# Patient Record
Sex: Female | Born: 1945 | Race: White | Hispanic: No | Marital: Married | Smoking: Never smoker
Health system: Southern US, Community
[De-identification: ages and names within clinical notes are randomized; demographics above are authoritative.]

## PROBLEM LIST (undated history)

## (undated) DIAGNOSIS — G0490 Encephalitis and encephalomyelitis, unspecified: Secondary | ICD-10-CM

## (undated) DIAGNOSIS — N6310 Unspecified lump in the right breast, unspecified quadrant: Secondary | ICD-10-CM

## (undated) DIAGNOSIS — E785 Hyperlipidemia, unspecified: Secondary | ICD-10-CM

## (undated) DIAGNOSIS — G44059 Short lasting unilateral neuralgiform headache with conjunctival injection and tearing (SUNCT), not intractable: Secondary | ICD-10-CM

## (undated) DIAGNOSIS — I10 Essential (primary) hypertension: Secondary | ICD-10-CM

## (undated) DIAGNOSIS — G709 Myoneural disorder, unspecified: Secondary | ICD-10-CM

## (undated) DIAGNOSIS — G039 Meningitis, unspecified: Secondary | ICD-10-CM

## (undated) HISTORY — PX: BREAST EXCISIONAL BIOPSY: SUR124

## (undated) HISTORY — DX: Encephalitis and encephalomyelitis, unspecified: G04.90

## (undated) HISTORY — PX: MOHS SURGERY: SHX181

## (undated) HISTORY — PX: BREAST LUMPECTOMY: SHX2

## (undated) HISTORY — PX: OTHER SURGICAL HISTORY: SHX169

## (undated) HISTORY — DX: Meningitis, unspecified: G03.9

## (undated) HISTORY — DX: Essential (primary) hypertension: I10

## (undated) HISTORY — DX: Hyperlipidemia, unspecified: E78.5

## (undated) HISTORY — DX: Short lasting unilateral neuralgiform headache with conjunctival injection and tearing (SUNCT), not intractable: G44.059

---

## 1979-10-17 HISTORY — PX: BREAST SURGERY: SHX581

## 1988-10-16 HISTORY — PX: ABDOMINAL HYSTERECTOMY: SHX81

## 1999-08-05 ENCOUNTER — Encounter: Admission: RE | Admit: 1999-08-05 | Discharge: 1999-08-05 | Payer: Self-pay | Admitting: Surgery

## 1999-08-05 ENCOUNTER — Encounter: Payer: Self-pay | Admitting: Surgery

## 2000-03-01 ENCOUNTER — Other Ambulatory Visit: Admission: RE | Admit: 2000-03-01 | Discharge: 2000-03-01 | Payer: Self-pay | Admitting: Obstetrics and Gynecology

## 2000-03-01 ENCOUNTER — Encounter (INDEPENDENT_AMBULATORY_CARE_PROVIDER_SITE_OTHER): Payer: Self-pay

## 2000-08-06 ENCOUNTER — Encounter: Admission: RE | Admit: 2000-08-06 | Discharge: 2000-08-06 | Payer: Self-pay | Admitting: Obstetrics and Gynecology

## 2000-08-06 ENCOUNTER — Encounter: Payer: Self-pay | Admitting: Obstetrics and Gynecology

## 2001-01-24 ENCOUNTER — Other Ambulatory Visit: Admission: RE | Admit: 2001-01-24 | Discharge: 2001-01-24 | Payer: Self-pay | Admitting: Obstetrics and Gynecology

## 2001-12-18 ENCOUNTER — Encounter: Payer: Self-pay | Admitting: Gynecology

## 2001-12-18 ENCOUNTER — Encounter: Admission: RE | Admit: 2001-12-18 | Discharge: 2001-12-18 | Payer: Self-pay | Admitting: Gynecology

## 2002-02-11 ENCOUNTER — Other Ambulatory Visit: Admission: RE | Admit: 2002-02-11 | Discharge: 2002-02-11 | Payer: Self-pay | Admitting: Gynecology

## 2003-01-08 ENCOUNTER — Encounter: Admission: RE | Admit: 2003-01-08 | Discharge: 2003-01-08 | Payer: Self-pay | Admitting: Gynecology

## 2003-01-08 ENCOUNTER — Encounter: Payer: Self-pay | Admitting: Gynecology

## 2003-03-10 ENCOUNTER — Other Ambulatory Visit: Admission: RE | Admit: 2003-03-10 | Discharge: 2003-03-10 | Payer: Self-pay | Admitting: Gynecology

## 2004-01-12 ENCOUNTER — Encounter: Admission: RE | Admit: 2004-01-12 | Discharge: 2004-01-12 | Payer: Self-pay | Admitting: Obstetrics and Gynecology

## 2004-03-30 ENCOUNTER — Other Ambulatory Visit: Admission: RE | Admit: 2004-03-30 | Discharge: 2004-03-30 | Payer: Self-pay | Admitting: Gynecology

## 2004-03-30 ENCOUNTER — Encounter: Admission: RE | Admit: 2004-03-30 | Discharge: 2004-03-30 | Payer: Self-pay | Admitting: Gynecology

## 2005-01-24 ENCOUNTER — Encounter: Admission: RE | Admit: 2005-01-24 | Discharge: 2005-01-24 | Payer: Self-pay | Admitting: Gynecology

## 2005-04-03 ENCOUNTER — Other Ambulatory Visit: Admission: RE | Admit: 2005-04-03 | Discharge: 2005-04-03 | Payer: Self-pay | Admitting: Gynecology

## 2005-04-16 ENCOUNTER — Encounter: Admission: RE | Admit: 2005-04-16 | Discharge: 2005-04-16 | Payer: Self-pay | Admitting: Gynecology

## 2006-04-26 ENCOUNTER — Other Ambulatory Visit: Admission: RE | Admit: 2006-04-26 | Discharge: 2006-04-26 | Payer: Self-pay | Admitting: Gynecology

## 2006-05-07 ENCOUNTER — Encounter: Admission: RE | Admit: 2006-05-07 | Discharge: 2006-05-07 | Payer: Self-pay | Admitting: Gynecology

## 2006-06-11 ENCOUNTER — Encounter: Admission: RE | Admit: 2006-06-11 | Discharge: 2006-06-11 | Payer: Self-pay | Admitting: Gynecology

## 2007-07-02 ENCOUNTER — Other Ambulatory Visit: Admission: RE | Admit: 2007-07-02 | Discharge: 2007-07-02 | Payer: Self-pay | Admitting: Gynecology

## 2007-07-02 ENCOUNTER — Encounter: Admission: RE | Admit: 2007-07-02 | Discharge: 2007-07-02 | Payer: Self-pay | Admitting: Gynecology

## 2008-07-24 ENCOUNTER — Encounter: Admission: RE | Admit: 2008-07-24 | Discharge: 2008-07-24 | Payer: Self-pay | Admitting: Gynecology

## 2009-08-20 ENCOUNTER — Encounter: Admission: RE | Admit: 2009-08-20 | Discharge: 2009-08-20 | Payer: Self-pay | Admitting: Gynecology

## 2010-08-25 ENCOUNTER — Encounter: Admission: RE | Admit: 2010-08-25 | Discharge: 2010-08-25 | Payer: Self-pay | Admitting: Gynecology

## 2011-08-23 ENCOUNTER — Other Ambulatory Visit: Payer: Self-pay | Admitting: Gynecology

## 2011-08-23 DIAGNOSIS — Z1231 Encounter for screening mammogram for malignant neoplasm of breast: Secondary | ICD-10-CM

## 2011-08-28 ENCOUNTER — Other Ambulatory Visit: Payer: Self-pay | Admitting: Gynecology

## 2011-08-28 DIAGNOSIS — R922 Inconclusive mammogram: Secondary | ICD-10-CM

## 2011-09-11 ENCOUNTER — Ambulatory Visit
Admission: RE | Admit: 2011-09-11 | Discharge: 2011-09-11 | Disposition: A | Discharge status: Home / Self Care | Payer: Medicare Other | Source: Ambulatory Visit | Attending: Gynecology | Admitting: Gynecology

## 2011-09-11 ENCOUNTER — Other Ambulatory Visit: Payer: Self-pay | Admitting: Gynecology

## 2011-09-11 DIAGNOSIS — R922 Inconclusive mammogram: Secondary | ICD-10-CM

## 2011-09-26 ENCOUNTER — Ambulatory Visit: Payer: Self-pay

## 2012-01-03 DIAGNOSIS — G4700 Insomnia, unspecified: Secondary | ICD-10-CM | POA: Diagnosis not present

## 2012-01-03 DIAGNOSIS — E785 Hyperlipidemia, unspecified: Secondary | ICD-10-CM | POA: Diagnosis not present

## 2012-01-03 DIAGNOSIS — E559 Vitamin D deficiency, unspecified: Secondary | ICD-10-CM | POA: Diagnosis not present

## 2012-01-03 DIAGNOSIS — M2550 Pain in unspecified joint: Secondary | ICD-10-CM | POA: Diagnosis not present

## 2012-04-05 DIAGNOSIS — R7989 Other specified abnormal findings of blood chemistry: Secondary | ICD-10-CM | POA: Diagnosis not present

## 2012-04-05 DIAGNOSIS — G4700 Insomnia, unspecified: Secondary | ICD-10-CM | POA: Diagnosis not present

## 2012-04-05 DIAGNOSIS — E785 Hyperlipidemia, unspecified: Secondary | ICD-10-CM | POA: Diagnosis not present

## 2012-04-05 DIAGNOSIS — I10 Essential (primary) hypertension: Secondary | ICD-10-CM | POA: Diagnosis not present

## 2012-07-28 DIAGNOSIS — Z23 Encounter for immunization: Secondary | ICD-10-CM | POA: Diagnosis not present

## 2012-08-05 DIAGNOSIS — G4700 Insomnia, unspecified: Secondary | ICD-10-CM | POA: Diagnosis not present

## 2012-08-05 DIAGNOSIS — E785 Hyperlipidemia, unspecified: Secondary | ICD-10-CM | POA: Diagnosis not present

## 2012-08-22 ENCOUNTER — Other Ambulatory Visit: Payer: Self-pay | Admitting: Gynecology

## 2012-08-22 DIAGNOSIS — Z1231 Encounter for screening mammogram for malignant neoplasm of breast: Secondary | ICD-10-CM

## 2012-09-30 ENCOUNTER — Ambulatory Visit
Admission: RE | Admit: 2012-09-30 | Discharge: 2012-09-30 | Disposition: A | Discharge status: Home / Self Care | Payer: Medicare Other | Source: Ambulatory Visit | Attending: Gynecology | Admitting: Gynecology

## 2012-09-30 ENCOUNTER — Ambulatory Visit: Payer: Medicare Other

## 2012-09-30 DIAGNOSIS — Z1231 Encounter for screening mammogram for malignant neoplasm of breast: Secondary | ICD-10-CM

## 2012-09-30 DIAGNOSIS — N8184 Pelvic muscle wasting: Secondary | ICD-10-CM | POA: Diagnosis not present

## 2012-09-30 DIAGNOSIS — E2839 Other primary ovarian failure: Secondary | ICD-10-CM | POA: Diagnosis not present

## 2012-09-30 DIAGNOSIS — Z01419 Encounter for gynecological examination (general) (routine) without abnormal findings: Secondary | ICD-10-CM | POA: Diagnosis not present

## 2012-09-30 DIAGNOSIS — M899 Disorder of bone, unspecified: Secondary | ICD-10-CM | POA: Diagnosis not present

## 2012-10-28 ENCOUNTER — Other Ambulatory Visit: Payer: Self-pay | Admitting: Gynecology

## 2012-10-28 DIAGNOSIS — M949 Disorder of cartilage, unspecified: Secondary | ICD-10-CM

## 2012-10-28 DIAGNOSIS — M899 Disorder of bone, unspecified: Secondary | ICD-10-CM

## 2012-11-22 ENCOUNTER — Ambulatory Visit
Admission: RE | Admit: 2012-11-22 | Discharge: 2012-11-22 | Disposition: A | Discharge status: Home / Self Care | Payer: Medicare Other | Source: Ambulatory Visit | Attending: Gynecology | Admitting: Gynecology

## 2012-11-22 DIAGNOSIS — M899 Disorder of bone, unspecified: Secondary | ICD-10-CM | POA: Diagnosis not present

## 2012-11-22 DIAGNOSIS — M949 Disorder of cartilage, unspecified: Secondary | ICD-10-CM | POA: Diagnosis not present

## 2013-02-11 ENCOUNTER — Ambulatory Visit: Payer: Self-pay

## 2013-02-11 ENCOUNTER — Telehealth: Payer: Self-pay | Admitting: Family Medicine

## 2013-02-11 DIAGNOSIS — H109 Unspecified conjunctivitis: Secondary | ICD-10-CM | POA: Diagnosis not present

## 2013-02-11 NOTE — Telephone Encounter (Signed)
APPT MADE

## 2013-06-03 ENCOUNTER — Ambulatory Visit (INDEPENDENT_AMBULATORY_CARE_PROVIDER_SITE_OTHER): Payer: Medicare Other | Admitting: Nurse Practitioner

## 2013-06-03 ENCOUNTER — Encounter: Payer: Self-pay | Admitting: Nurse Practitioner

## 2013-06-03 ENCOUNTER — Ambulatory Visit (INDEPENDENT_AMBULATORY_CARE_PROVIDER_SITE_OTHER): Payer: Medicare Other

## 2013-06-03 VITALS — BP 147/69 | HR 72 | Temp 97.2°F | Ht 63.0 in | Wt 140.0 lb

## 2013-06-03 DIAGNOSIS — E785 Hyperlipidemia, unspecified: Secondary | ICD-10-CM

## 2013-06-03 DIAGNOSIS — M25551 Pain in right hip: Secondary | ICD-10-CM

## 2013-06-03 DIAGNOSIS — M25559 Pain in unspecified hip: Secondary | ICD-10-CM

## 2013-06-03 MED ORDER — SIMVASTATIN 20 MG PO TABS: 20.0000 mg | ORAL_TABLET | Freq: Every day | ORAL | Status: DC

## 2013-06-03 MED ORDER — DICLOFENAC SODIUM 50 MG PO TBEC: 50.0000 mg | DELAYED_RELEASE_TABLET | Freq: Two times a day (BID) | ORAL | Status: DC

## 2013-06-03 NOTE — Patient Instructions (Addendum)

## 2013-06-03 NOTE — Progress Notes (Signed)
  Subjective:    Patient ID: Kathryn Young, female    DOB: [birth year], 67 y.o.   MRN: [id number]  Hyperlipidemia This is a chronic problem. The current episode started more than 1 year ago. The problem is uncontrolled. Recent lipid tests were reviewed and are high. She has no history of diabetes, hypothyroidism, liver disease, obesity or nephrotic syndrome. There are no known factors aggravating her hyperlipidemia. Pertinent negatives include no chest pain, focal sensory loss, focal weakness, leg pain, myalgias or shortness of breath. Current antihyperlipidemic treatment includes statins. The current treatment provides moderate improvement of lipids. There are no compliance problems.  Risk factors for coronary artery disease include post-menopausal and family history.  Right hip pain Taking Voltaren and that helps with pain- SInce she retired her hip isn't hurtinh her as bad.  * started seeing floaters in vision everyday for the last 2 weeks- the floaters seem to stay there for several minutes and happens several times a day.  Review of Systems  Eyes: Positive for visual disturbance.  Respiratory: Negative.  Negative for shortness of breath.   Cardiovascular: Negative.  Negative for chest pain.  Genitourinary: Negative.   Musculoskeletal: Positive for arthralgias (right hip). Negative for myalgias.  Neurological: Negative.  Negative for focal weakness.       Objective:   Physical Exam  Constitutional: She is oriented to person, place, and time. She appears well-developed and well-nourished.  HENT:  Nose: Nose normal.  Mouth/Throat: Oropharynx is clear and moist.  Eyes: EOM are normal.  Neck: Trachea normal, normal range of motion and full passive range of motion without pain. Neck supple. No JVD present. Carotid bruit is not present. No thyromegaly present.  Cardiovascular: Normal rate, regular rhythm, normal heart sounds and intact distal pulses.  Exam reveals no gallop and no friction  rub.   No murmur heard. Pulmonary/Chest: Effort normal and breath sounds normal.  Abdominal: Soft. Bowel sounds are normal. She exhibits no distension and no mass. There is no tenderness.  Musculoskeletal: Normal range of motion.  Lymphadenopathy:    She has no cervical adenopathy.  Neurological: She is alert and oriented to person, place, and time. She has normal reflexes.  Skin: Skin is warm and dry.  Psychiatric: She has a normal mood and affect. Her behavior is normal. Judgment and thought content normal.   BP 147/69  Pulse 72  Temp(Src) 97.2 F (36.2 C) (Oral)  Ht 5\' 3"  (1.6 m)  Wt 140 lb (63.504 kg)  BMI 24.81 kg/m2 Right hip xray- early osteo arthritis- Preliminary reading by Paulene Floor, FNP  [facility]        Assessment & Plan:  1. Hyperlipidemia Low fat diet AND EXERCISE - CMP14+EGFR - NMR, lipoprofile - simvastatin (ZOCOR) 20 MG tablet; Take 1 tablet (20 mg total) by mouth daily.  Dispense: 30 tablet; Refill: 5  2. Right hip pain Will continue voltaren as previously rx- patien will let me know when needs ortho referral - DG Hip Complete Right; Future - diclofenac (VOLTAREN) 50 MG EC tablet; Take 1 tablet (50 mg total) by mouth 2 (two) times daily.  Dispense: 60 tablet; Refill: 2  Health maintenance reviewed  Mary-Margaret Daphine Deutscher, FNP

## 2013-06-05 LAB — NMR, LIPOPROFILE
Cholesterol: 159 mg/dL (ref <200)
HDL Cholesterol by NMR: 61 mg/dL (ref >=40)
HDL Particle Number: 36.2 umol/L (ref >=30.5)
LDL Size: 20.7 nm (ref >20.5)
LDLC SERPL CALC-MCNC: 75 mg/dL (ref <100)
LP-IR Score: 30 (ref <=45)
Small LDL Particle Number: 366 nmol/L (ref <=527)

## 2013-06-05 LAB — CMP14+EGFR
Albumin/Globulin Ratio: 2.1 (ref 1.1–2.5)
Alkaline Phosphatase: 97 IU/L (ref 39–117)
BUN/Creatinine Ratio: 13 (ref 11–26)
BUN: 11 mg/dL (ref 8–27)
Calcium: 10 mg/dL (ref 8.6–10.2)
Creatinine, Ser: 0.86 mg/dL (ref 0.57–1.00)
Globulin, Total: 2.2 g/dL (ref 1.5–4.5)
Sodium: 142 mmol/L (ref 134–144)
Total Bilirubin: 0.4 mg/dL (ref 0.0–1.2)
Total Protein: 6.8 g/dL (ref 6.0–8.5)

## 2013-06-18 ENCOUNTER — Telehealth: Payer: Self-pay | Admitting: Nurse Practitioner

## 2013-06-18 NOTE — Telephone Encounter (Signed)
Pt aware.

## 2013-07-26 DIAGNOSIS — Z23 Encounter for immunization: Secondary | ICD-10-CM | POA: Diagnosis not present

## 2013-09-02 DIAGNOSIS — L820 Inflamed seborrheic keratosis: Secondary | ICD-10-CM | POA: Diagnosis not present

## 2013-09-02 DIAGNOSIS — L819 Disorder of pigmentation, unspecified: Secondary | ICD-10-CM | POA: Diagnosis not present

## 2013-09-02 DIAGNOSIS — L570 Actinic keratosis: Secondary | ICD-10-CM | POA: Diagnosis not present

## 2013-09-02 DIAGNOSIS — D2330 Other benign neoplasm of skin of unspecified part of face: Secondary | ICD-10-CM | POA: Diagnosis not present

## 2013-09-02 DIAGNOSIS — D1801 Hemangioma of skin and subcutaneous tissue: Secondary | ICD-10-CM | POA: Diagnosis not present

## 2013-09-02 DIAGNOSIS — D485 Neoplasm of uncertain behavior of skin: Secondary | ICD-10-CM | POA: Diagnosis not present

## 2013-09-02 DIAGNOSIS — L821 Other seborrheic keratosis: Secondary | ICD-10-CM | POA: Diagnosis not present

## 2013-09-24 ENCOUNTER — Other Ambulatory Visit: Payer: Self-pay

## 2013-09-24 DIAGNOSIS — Z1231 Encounter for screening mammogram for malignant neoplasm of breast: Secondary | ICD-10-CM

## 2013-11-05 ENCOUNTER — Ambulatory Visit
Admission: RE | Admit: 2013-11-05 | Discharge: 2013-11-05 | Disposition: A | Discharge status: Home / Self Care | Payer: Medicare Other | Source: Ambulatory Visit

## 2013-11-05 DIAGNOSIS — Z1231 Encounter for screening mammogram for malignant neoplasm of breast: Secondary | ICD-10-CM

## 2014-01-23 ENCOUNTER — Telehealth: Payer: Self-pay | Admitting: Nurse Practitioner

## 2014-01-23 NOTE — Telephone Encounter (Signed)
Patient noticed a burning sensation when she applies pressure to her face, shoulder, and neck. Her clothes also irritate it. This all runs on the left side. This has been going on for 3 weeks. She hasn't noticed a rash. She has had the shingles vaccine.  She feels ok otherwise. No fever or malaise. Tylenol helps a little.   Appt scheduled for Monday.  Made aware that there is someone on call if her symptoms worsen and that there is an urgent care in town as well.

## 2014-01-26 ENCOUNTER — Encounter: Payer: Self-pay | Admitting: Family Medicine

## 2014-01-26 ENCOUNTER — Ambulatory Visit (INDEPENDENT_AMBULATORY_CARE_PROVIDER_SITE_OTHER): Payer: Medicare Other

## 2014-01-26 ENCOUNTER — Ambulatory Visit (INDEPENDENT_AMBULATORY_CARE_PROVIDER_SITE_OTHER): Payer: Medicare Other | Admitting: Family Medicine

## 2014-01-26 VITALS — BP 143/72 | HR 61 | Temp 97.5°F | Ht 63.0 in | Wt 141.2 lb

## 2014-01-26 DIAGNOSIS — R209 Unspecified disturbances of skin sensation: Secondary | ICD-10-CM

## 2014-01-26 DIAGNOSIS — R5383 Other fatigue: Secondary | ICD-10-CM | POA: Diagnosis not present

## 2014-01-26 DIAGNOSIS — M542 Cervicalgia: Secondary | ICD-10-CM | POA: Diagnosis not present

## 2014-01-26 DIAGNOSIS — E559 Vitamin D deficiency, unspecified: Secondary | ICD-10-CM | POA: Diagnosis not present

## 2014-01-26 DIAGNOSIS — R200 Anesthesia of skin: Secondary | ICD-10-CM

## 2014-01-26 DIAGNOSIS — R5381 Other malaise: Secondary | ICD-10-CM

## 2014-01-26 DIAGNOSIS — E785 Hyperlipidemia, unspecified: Secondary | ICD-10-CM | POA: Diagnosis not present

## 2014-01-26 LAB — POCT CBC
Granulocyte percent: 57.6 %G (ref 37–80)
HCT, POC: 41.3 % (ref 37.7–47.9)
Hemoglobin: 13.3 g/dL (ref 12.2–16.2)
Lymph, poc: 2.1 (ref 0.6–3.4)
MCH, POC: 30.2 pg (ref 27–31.2)
MCHC: 32.2 g/dL (ref 31.8–35.4)
MCV: 93.8 fL (ref 80–97)
MPV: 7.8 fL (ref 0–99.8)
POC Granulocyte: 3.2 (ref 2–6.9)
POC LYMPH PERCENT: 37.2 %L (ref 10–50)
Platelet Count, POC: 222 10*3/uL (ref 142–424)
RBC: 4.4 M/uL (ref 4.04–5.48)
RDW, POC: 13.1 %
WBC: 5.6 10*3/uL (ref 4.6–10.2)

## 2014-01-26 MED ORDER — METHYLPREDNISOLONE ACETATE 80 MG/ML IJ SUSP
80.0000 mg | Freq: Once | INTRAMUSCULAR | Status: AC
  Administered 2014-01-26: 80 mg via INTRAMUSCULAR

## 2014-01-26 MED ORDER — ACYCLOVIR 800 MG PO TABS: 800.0000 mg | ORAL_TABLET | Freq: Every day | ORAL | Status: DC

## 2014-01-26 NOTE — Progress Notes (Signed)
   Subjective:    Patient ID: Kathryn Young, female    DOB: 02-28-1946, 68 y.o.   MRN: [id number]  HPI  This 68 y.o. female presents for evaluation of having numbness and tingling in her face, scalp, Neck, back, left arm and hand, and left lumbar spine.  She states she has been feeling like she Has numbness and tingling in her right trapezius region where her t-shirt touches her back.  She Denies any rashes. She has been having some neck discomfort. She has hx of hyperlipidemia And she has not had labs since 8/14. Review of Systems C/o neck discomfort and numbness and tingling   No chest pain, SOB, HA, dizziness, vision change, N/V, diarrhea, constipation, dysuria, urinary urgency or frequency, or rash.  Objective:   Physical Exam  Vital signs noted  Well developed well nourished female.  HEENT - Head atraumatic Normocephalic                Eyes - PERRLA, Conjuctiva - clear Sclera- Clear EOMI                Ears - EAC's Wnl TM's Wnl Gross Hearing WNL                Nose - Nares patent                 Throat - oropharanx wnl Respiratory - Lungs CTA bilateral Cardiac - RRR S1 and S2 without murmur GI - Abdomen soft Nontender and bowel sounds active x 4 Extremities - No edema. Neuro - Grossly intact. CN 2-12 intact      Assessment & Plan:  Other and unspecified hyperlipidemia - Plan: Lipid panel  Other malaise and fatigue - Plan: POCT CBC, CMP14+EGFR, Thyroid Panel With TSH, Vitamin B12, Vit D  25 hydroxy (rtn osteoporosis monitoring)  Cervicalgia - Plan: DG Cervical Spine Complete, methylPREDNISolone acetate (DEPO-MEDROL) injection 80 mg  Numbness - Plan: Vitamin B12, acyclovir (ZOVIRAX) 800 MG tablet, methylPREDNISolone acetate (DEPO-MEDROL) injection 80 mg  Discussed with patient that I am not sure this doesn't look like shingles but will tx for this and will get  Xray of neck and labs and follow up in one week.  Lysbeth Penner FNP

## 2014-01-27 LAB — CMP14+EGFR
ALT: 12 IU/L (ref 0–32)
AST: 17 IU/L (ref 0–40)
Albumin/Globulin Ratio: 2.2 (ref 1.1–2.5)
Albumin: 4.6 g/dL (ref 3.6–4.8)
Alkaline Phosphatase: 95 IU/L (ref 39–117)
BUN/Creatinine Ratio: 15 (ref 11–26)
BUN: 12 mg/dL (ref 8–27)
CO2: 23 mmol/L (ref 18–29)
Calcium: 9.8 mg/dL (ref 8.7–10.3)
Chloride: 101 mmol/L (ref 97–108)
Creatinine, Ser: 0.82 mg/dL (ref 0.57–1.00)
GFR calc Af Amer: 86 mL/min/{1.73_m2} (ref >59)
GFR calc non Af Amer: 74 mL/min/{1.73_m2} (ref >59)
Globulin, Total: 2.1 g/dL (ref 1.5–4.5)
Glucose: 105 mg/dL — ABNORMAL HIGH (ref 65–99)
Potassium: 4.4 mmol/L (ref 3.5–5.2)
Sodium: 142 mmol/L (ref 134–144)
Total Bilirubin: 0.5 mg/dL (ref 0.0–1.2)
Total Protein: 6.7 g/dL (ref 6.0–8.5)

## 2014-01-27 LAB — LIPID PANEL
Chol/HDL Ratio: 2.7 ratio units (ref 0.0–4.4)
Cholesterol, Total: 167 mg/dL (ref 100–199)
HDL: 63 mg/dL (ref >39)
LDL Calculated: 85 mg/dL (ref 0–99)
Triglycerides: 95 mg/dL (ref 0–149)
VLDL Cholesterol Cal: 19 mg/dL (ref 5–40)

## 2014-01-27 LAB — THYROID PANEL WITH TSH
Free Thyroxine Index: 2.2 (ref 1.2–4.9)
T3 Uptake Ratio: 30 % (ref 24–39)
T4, Total: 7.2 ug/dL (ref 4.5–12.0)
TSH: 2.28 u[IU]/mL (ref 0.450–4.500)

## 2014-01-27 LAB — VITAMIN B12: Vitamin B-12: 428 pg/mL (ref 211–946)

## 2014-01-27 LAB — VITAMIN D 25 HYDROXY (VIT D DEFICIENCY, FRACTURES): Vit D, 25-Hydroxy: 38.7 ng/mL (ref 30.0–100.0)

## 2014-02-02 ENCOUNTER — Ambulatory Visit (INDEPENDENT_AMBULATORY_CARE_PROVIDER_SITE_OTHER): Payer: Medicare Other | Admitting: Family Medicine

## 2014-02-02 ENCOUNTER — Encounter: Payer: Self-pay | Admitting: Family Medicine

## 2014-02-02 VITALS — BP 156/71 | HR 65 | Temp 97.4°F | Ht 63.0 in | Wt 141.2 lb

## 2014-02-02 DIAGNOSIS — M129 Arthropathy, unspecified: Secondary | ICD-10-CM | POA: Diagnosis not present

## 2014-02-02 DIAGNOSIS — M1990 Unspecified osteoarthritis, unspecified site: Secondary | ICD-10-CM

## 2014-02-02 MED ORDER — MELOXICAM 15 MG PO TABS: 15.0000 mg | ORAL_TABLET | Freq: Every day | ORAL | Status: DC

## 2014-02-02 NOTE — Progress Notes (Signed)
   Subjective:    Patient ID: Kathryn Young, female    DOB: 10-10-1946, 68 y.o.   MRN: [id number]  HPI This 68 y.o. female presents for evaluation of neck discomfort with some numbness in her Upper neck and discomfort across her shoulders. She has had some facial numbness which Has resolved.  She is taking acyclovir for possible shingles and now is not having numbness over left eye like she was last week. She has had labs and xrays.  Her cervical spine xray shows c6 c7 DDD and her labs were normal..   Review of Systems C/o neck discomfort and arthritis No chest pain, SOB, HA, dizziness, vision change, N/V, diarrhea, constipation, dysuria, urinary urgency or frequency, myalgias, arthralgias or rash.     Objective:   Physical Exam Vital signs noted  Well developed well nourished female.  HEENT - Head atraumatic Normocephalic                Eyes - PERRLA, Conjuctiva - clear Sclera- Clear EOMI                Ears - EAC's Wnl TM's Wnl Gross Hearing WNL                Nose - Nares patent                 Throat - oropharanx wnl Respiratory - Lungs CTA bilateral Cardiac - RRR S1 and S2 without murmur GI - Abdomen soft Nontender and bowel sounds active x 4 Extremities - No edema. Neuro - Grossly intact.       Assessment & Plan:  Arthritis - Plan: meloxicam (MOBIC) 15 MG tablet One po qd #30/3 rf  Follow up prn if not better.  Lysbeth Penner FNP

## 2014-03-20 DIAGNOSIS — M949 Disorder of cartilage, unspecified: Secondary | ICD-10-CM | POA: Diagnosis not present

## 2014-03-20 DIAGNOSIS — M899 Disorder of bone, unspecified: Secondary | ICD-10-CM | POA: Diagnosis not present

## 2014-03-20 DIAGNOSIS — Z0000 Encounter for general adult medical examination without abnormal findings: Secondary | ICD-10-CM | POA: Diagnosis not present

## 2014-03-20 DIAGNOSIS — Z01419 Encounter for gynecological examination (general) (routine) without abnormal findings: Secondary | ICD-10-CM | POA: Diagnosis not present

## 2014-03-23 ENCOUNTER — Other Ambulatory Visit: Payer: Self-pay | Admitting: Gynecology

## 2014-03-23 DIAGNOSIS — M8580 Other specified disorders of bone density and structure, unspecified site: Secondary | ICD-10-CM

## 2014-04-07 ENCOUNTER — Other Ambulatory Visit: Payer: Self-pay | Admitting: Family Medicine

## 2014-04-07 ENCOUNTER — Telehealth: Payer: Self-pay | Admitting: Family Medicine

## 2014-04-07 NOTE — Telephone Encounter (Signed)
Patient pain in back and neck real sensitive and numb in face  Has already seen you for this but you treated her for shingles but its still there.

## 2014-04-07 NOTE — Telephone Encounter (Signed)
Why does she need referral to neurology?  She needs to be seen

## 2014-04-08 NOTE — Telephone Encounter (Signed)
Appointment made for 6/25

## 2014-04-09 ENCOUNTER — Ambulatory Visit (INDEPENDENT_AMBULATORY_CARE_PROVIDER_SITE_OTHER): Payer: Medicare Other | Admitting: Family Medicine

## 2014-04-09 ENCOUNTER — Encounter: Payer: Self-pay | Admitting: Family Medicine

## 2014-04-09 VITALS — BP 138/73 | HR 69 | Temp 97.8°F | Ht 63.0 in | Wt 141.2 lb

## 2014-04-09 DIAGNOSIS — M542 Cervicalgia: Secondary | ICD-10-CM

## 2014-04-09 NOTE — Progress Notes (Signed)
   Subjective:    Patient ID: Kathryn Young, female    DOB: 1946/06/02, 68 y.o.   MRN: [id number]  HPI  C/o myofascial discomfort and cephalgia.  She has some numbness in her hands in the am.  She denies any cervical radicular sx's.  She has had xray of the cervical spine showing DDD. She denies any neuromuscular abnormalities of the upper extremities.  She was tx'd with mobic And it is not helping and she wants referral  Review of Systems C/o myofascial discomfort and headaches   No chest pain, SOB,  dizziness, vision change, N/V, diarrhea, constipation, dysuria, urinary urgency or frequency, myalgias, arthralgias or rash.  Objective:   Physical Exam Vital signs noted  Well developed well nourished female.  HEENT - Head atraumatic Normocephalic Respiratory - Lungs CTA bilateral Cardiac - RRR S1 and S2 without murmur MS - TTP bilateral trapezius and myofascial region.  Full ROM cervical spine. No TTP cervical spine.      Assessment & Plan:  Cervicalgia - Plan: Ambulatory referral to Physical Therapy Continue mobic and follow up prn  Lysbeth Penner FNP

## 2014-04-16 ENCOUNTER — Ambulatory Visit: Payer: Medicare Other | Attending: Family Medicine | Admitting: Physical Therapy

## 2014-04-16 DIAGNOSIS — M542 Cervicalgia: Secondary | ICD-10-CM | POA: Insufficient documentation

## 2014-04-16 DIAGNOSIS — R293 Abnormal posture: Secondary | ICD-10-CM | POA: Insufficient documentation

## 2014-04-16 DIAGNOSIS — IMO0001 Reserved for inherently not codable concepts without codable children: Secondary | ICD-10-CM | POA: Diagnosis not present

## 2014-04-16 DIAGNOSIS — R5381 Other malaise: Secondary | ICD-10-CM | POA: Insufficient documentation

## 2014-04-20 ENCOUNTER — Ambulatory Visit: Payer: Medicare Other | Admitting: Physical Therapy

## 2014-04-20 DIAGNOSIS — R293 Abnormal posture: Secondary | ICD-10-CM | POA: Diagnosis not present

## 2014-04-20 DIAGNOSIS — R5381 Other malaise: Secondary | ICD-10-CM | POA: Diagnosis not present

## 2014-04-20 DIAGNOSIS — M542 Cervicalgia: Secondary | ICD-10-CM | POA: Diagnosis not present

## 2014-04-20 DIAGNOSIS — IMO0001 Reserved for inherently not codable concepts without codable children: Secondary | ICD-10-CM | POA: Diagnosis not present

## 2014-04-22 ENCOUNTER — Ambulatory Visit: Payer: Medicare Other | Admitting: Physical Therapy

## 2014-04-22 DIAGNOSIS — R5381 Other malaise: Secondary | ICD-10-CM | POA: Diagnosis not present

## 2014-04-22 DIAGNOSIS — M542 Cervicalgia: Secondary | ICD-10-CM | POA: Diagnosis not present

## 2014-04-22 DIAGNOSIS — R293 Abnormal posture: Secondary | ICD-10-CM | POA: Diagnosis not present

## 2014-04-22 DIAGNOSIS — IMO0001 Reserved for inherently not codable concepts without codable children: Secondary | ICD-10-CM | POA: Diagnosis not present

## 2014-04-28 ENCOUNTER — Ambulatory Visit: Payer: Medicare Other | Admitting: Physical Therapy

## 2014-04-28 DIAGNOSIS — R5381 Other malaise: Secondary | ICD-10-CM | POA: Diagnosis not present

## 2014-04-28 DIAGNOSIS — M542 Cervicalgia: Secondary | ICD-10-CM | POA: Diagnosis not present

## 2014-04-28 DIAGNOSIS — R293 Abnormal posture: Secondary | ICD-10-CM | POA: Diagnosis not present

## 2014-04-28 DIAGNOSIS — IMO0001 Reserved for inherently not codable concepts without codable children: Secondary | ICD-10-CM | POA: Diagnosis not present

## 2014-04-30 ENCOUNTER — Ambulatory Visit: Payer: Medicare Other | Admitting: Physical Therapy

## 2014-04-30 DIAGNOSIS — IMO0001 Reserved for inherently not codable concepts without codable children: Secondary | ICD-10-CM | POA: Diagnosis not present

## 2014-04-30 DIAGNOSIS — R293 Abnormal posture: Secondary | ICD-10-CM | POA: Diagnosis not present

## 2014-04-30 DIAGNOSIS — R5381 Other malaise: Secondary | ICD-10-CM | POA: Diagnosis not present

## 2014-04-30 DIAGNOSIS — M542 Cervicalgia: Secondary | ICD-10-CM | POA: Diagnosis not present

## 2014-05-05 ENCOUNTER — Ambulatory Visit: Payer: Medicare Other | Admitting: Physical Therapy

## 2014-05-05 DIAGNOSIS — M542 Cervicalgia: Secondary | ICD-10-CM | POA: Diagnosis not present

## 2014-05-05 DIAGNOSIS — R293 Abnormal posture: Secondary | ICD-10-CM | POA: Diagnosis not present

## 2014-05-05 DIAGNOSIS — R5381 Other malaise: Secondary | ICD-10-CM | POA: Diagnosis not present

## 2014-05-05 DIAGNOSIS — IMO0001 Reserved for inherently not codable concepts without codable children: Secondary | ICD-10-CM | POA: Diagnosis not present

## 2014-05-07 ENCOUNTER — Ambulatory Visit: Payer: Medicare Other | Admitting: Physical Therapy

## 2014-05-07 DIAGNOSIS — IMO0001 Reserved for inherently not codable concepts without codable children: Secondary | ICD-10-CM | POA: Diagnosis not present

## 2014-05-07 DIAGNOSIS — M542 Cervicalgia: Secondary | ICD-10-CM | POA: Diagnosis not present

## 2014-05-07 DIAGNOSIS — R293 Abnormal posture: Secondary | ICD-10-CM | POA: Diagnosis not present

## 2014-05-07 DIAGNOSIS — R5381 Other malaise: Secondary | ICD-10-CM | POA: Diagnosis not present

## 2014-05-19 ENCOUNTER — Ambulatory Visit: Payer: Medicare Other | Attending: Family Medicine | Admitting: Physical Therapy

## 2014-05-19 DIAGNOSIS — M542 Cervicalgia: Secondary | ICD-10-CM | POA: Insufficient documentation

## 2014-05-19 DIAGNOSIS — R293 Abnormal posture: Secondary | ICD-10-CM | POA: Diagnosis not present

## 2014-05-19 DIAGNOSIS — IMO0001 Reserved for inherently not codable concepts without codable children: Secondary | ICD-10-CM | POA: Diagnosis not present

## 2014-05-19 DIAGNOSIS — R5381 Other malaise: Secondary | ICD-10-CM | POA: Insufficient documentation

## 2014-05-21 ENCOUNTER — Ambulatory Visit: Payer: Medicare Other | Admitting: Physical Therapy

## 2014-05-21 DIAGNOSIS — IMO0001 Reserved for inherently not codable concepts without codable children: Secondary | ICD-10-CM | POA: Diagnosis not present

## 2014-05-26 ENCOUNTER — Encounter: Payer: Medicare Other | Admitting: Physical Therapy

## 2014-05-28 ENCOUNTER — Ambulatory Visit: Payer: Medicare Other | Admitting: Physical Therapy

## 2014-05-28 DIAGNOSIS — IMO0001 Reserved for inherently not codable concepts without codable children: Secondary | ICD-10-CM | POA: Diagnosis not present

## 2014-05-29 ENCOUNTER — Ambulatory Visit: Payer: Medicare Other | Admitting: Physical Therapy

## 2014-05-29 DIAGNOSIS — IMO0001 Reserved for inherently not codable concepts without codable children: Secondary | ICD-10-CM | POA: Diagnosis not present

## 2014-06-25 ENCOUNTER — Other Ambulatory Visit: Payer: Self-pay | Admitting: Family Medicine

## 2014-07-25 DIAGNOSIS — Z23 Encounter for immunization: Secondary | ICD-10-CM | POA: Diagnosis not present

## 2014-10-01 ENCOUNTER — Encounter: Payer: Self-pay | Admitting: Family Medicine

## 2014-10-01 ENCOUNTER — Telehealth: Payer: Self-pay | Admitting: Family Medicine

## 2014-10-01 ENCOUNTER — Ambulatory Visit (INDEPENDENT_AMBULATORY_CARE_PROVIDER_SITE_OTHER): Payer: Medicare Other | Admitting: Family Medicine

## 2014-10-01 VITALS — BP 167/84 | HR 77 | Temp 97.7°F | Ht 63.0 in | Wt 140.4 lb

## 2014-10-01 DIAGNOSIS — R300 Dysuria: Secondary | ICD-10-CM

## 2014-10-01 LAB — POCT URINALYSIS DIPSTICK
Bilirubin, UA: NEGATIVE
Glucose, UA: NEGATIVE
Ketones, UA: NEGATIVE
NITRITE UA: NEGATIVE
Protein, UA: NEGATIVE
Spec Grav, UA: 1.02
UROBILINOGEN UA: NEGATIVE
pH, UA: 6

## 2014-10-01 LAB — POCT UA - MICROSCOPIC ONLY
Bacteria, U Microscopic: NEGATIVE
CASTS, UR, LPF, POC: NEGATIVE
Crystals, Ur, HPF, POC: NEGATIVE
Mucus, UA: NEGATIVE
YEAST UA: NEGATIVE

## 2014-10-01 MED ORDER — SIMVASTATIN 20 MG PO TABS: 20.0000 mg | ORAL_TABLET | Freq: Every day | ORAL | Status: DC

## 2014-10-01 NOTE — Progress Notes (Signed)
   Subjective:    Patient ID: Kathryn Young, female    DOB: September 19, 1946, 68 y.o.   MRN: [id number]  Dysuria  This is a recurrent problem. The current episode started more than 1 month ago. The problem occurs intermittently. The problem has been unchanged. The pain is mild. There has been no fever. There is no history of pyelonephritis. Associated symptoms include hematuria. She has tried nothing for the symptoms. Her past medical history is significant for kidney stones.      Review of Systems  Constitutional: Negative.   HENT: Negative.   Eyes: Negative.   Respiratory: Negative.   Cardiovascular: Negative.   Gastrointestinal: Negative.   Endocrine: Negative.   Genitourinary: Positive for dysuria and hematuria.  Hematological: Negative.   Psychiatric/Behavioral: Negative.        Objective:   Physical Exam  Constitutional: She appears well-developed and well-nourished.  Cardiovascular: Normal rate.   Pulmonary/Chest: Effort normal.  Abdominal: Soft. There is no tenderness. There is no rebound.          1. Dysuria Since sx are not new and she is not "sick", will await result of C&S and push fluids in the meantime - POCT UA - Microscopic Only - POCT urinalysis dipstick  Wardell Honour MD - Urine culture

## 2014-10-02 LAB — URINE CULTURE: ORGANISM ID, BACTERIA: NO GROWTH

## 2014-10-05 ENCOUNTER — Telehealth: Payer: Self-pay

## 2014-10-05 NOTE — Telephone Encounter (Signed)
Left results on home am as per DPR of 06/03/2013

## 2014-10-05 NOTE — Telephone Encounter (Signed)
-----   Message from Wardell Honour, MD sent at 10/05/2014  7:50 AM EST ----- Urine culture is negative no need for antibiotics

## 2014-10-13 DIAGNOSIS — H2513 Age-related nuclear cataract, bilateral: Secondary | ICD-10-CM | POA: Diagnosis not present

## 2014-10-13 DIAGNOSIS — H43813 Vitreous degeneration, bilateral: Secondary | ICD-10-CM | POA: Diagnosis not present

## 2014-10-13 DIAGNOSIS — H43812 Vitreous degeneration, left eye: Secondary | ICD-10-CM | POA: Diagnosis not present

## 2014-10-29 ENCOUNTER — Other Ambulatory Visit: Payer: Self-pay

## 2014-10-29 DIAGNOSIS — Z1231 Encounter for screening mammogram for malignant neoplasm of breast: Secondary | ICD-10-CM

## 2014-11-11 DIAGNOSIS — L821 Other seborrheic keratosis: Secondary | ICD-10-CM | POA: Diagnosis not present

## 2014-11-11 DIAGNOSIS — D225 Melanocytic nevi of trunk: Secondary | ICD-10-CM | POA: Diagnosis not present

## 2014-11-11 DIAGNOSIS — D2261 Melanocytic nevi of right upper limb, including shoulder: Secondary | ICD-10-CM | POA: Diagnosis not present

## 2014-11-11 DIAGNOSIS — D485 Neoplasm of uncertain behavior of skin: Secondary | ICD-10-CM | POA: Diagnosis not present

## 2014-11-11 DIAGNOSIS — B079 Viral wart, unspecified: Secondary | ICD-10-CM | POA: Diagnosis not present

## 2014-11-11 DIAGNOSIS — L814 Other melanin hyperpigmentation: Secondary | ICD-10-CM | POA: Diagnosis not present

## 2014-11-11 DIAGNOSIS — D1801 Hemangioma of skin and subcutaneous tissue: Secondary | ICD-10-CM | POA: Diagnosis not present

## 2014-11-11 DIAGNOSIS — B078 Other viral warts: Secondary | ICD-10-CM | POA: Diagnosis not present

## 2014-12-02 ENCOUNTER — Ambulatory Visit
Admission: RE | Admit: 2014-12-02 | Discharge: 2014-12-02 | Disposition: A | Discharge status: Home / Self Care | Payer: Medicare Other | Source: Ambulatory Visit

## 2014-12-02 ENCOUNTER — Ambulatory Visit
Admission: RE | Admit: 2014-12-02 | Discharge: 2014-12-02 | Disposition: A | Discharge status: Home / Self Care | Payer: Medicare Other | Source: Ambulatory Visit | Attending: Gynecology | Admitting: Gynecology

## 2014-12-02 ENCOUNTER — Encounter (INDEPENDENT_AMBULATORY_CARE_PROVIDER_SITE_OTHER): Payer: Self-pay

## 2014-12-02 DIAGNOSIS — Z1231 Encounter for screening mammogram for malignant neoplasm of breast: Secondary | ICD-10-CM

## 2014-12-02 DIAGNOSIS — M899 Disorder of bone, unspecified: Secondary | ICD-10-CM | POA: Diagnosis not present

## 2014-12-02 DIAGNOSIS — M8580 Other specified disorders of bone density and structure, unspecified site: Secondary | ICD-10-CM

## 2015-02-26 ENCOUNTER — Encounter: Payer: Self-pay | Admitting: Family Medicine

## 2015-02-26 ENCOUNTER — Ambulatory Visit (INDEPENDENT_AMBULATORY_CARE_PROVIDER_SITE_OTHER): Payer: Medicare Other | Admitting: Family Medicine

## 2015-02-26 VITALS — BP 150/77 | HR 72 | Temp 97.3°F | Ht 63.0 in | Wt 141.0 lb

## 2015-02-26 DIAGNOSIS — E785 Hyperlipidemia, unspecified: Secondary | ICD-10-CM

## 2015-02-26 DIAGNOSIS — M792 Neuralgia and neuritis, unspecified: Secondary | ICD-10-CM

## 2015-02-26 LAB — POCT CBC
Granulocyte percent: 55.3 %G (ref 37–80)
HCT, POC: 41.4 % (ref 37.7–47.9)
HEMOGLOBIN: 12.9 g/dL (ref 12.2–16.2)
Lymph, poc: 2.3 (ref 0.6–3.4)
MCH: 29 pg (ref 27–31.2)
MCHC: 31.1 g/dL — AB (ref 31.8–35.4)
MCV: 93.4 fL (ref 80–97)
MPV: 7.8 fL (ref 0–99.8)
POC Granulocyte: 3.4 (ref 2–6.9)
POC LYMPH %: 37.7 % (ref 10–50)
Platelet Count, POC: 242 10*3/uL (ref 142–424)
RBC: 4.44 M/uL (ref 4.04–5.48)
RDW, POC: 12.6 %
WBC: 6.1 10*3/uL (ref 4.6–10.2)

## 2015-02-26 MED ORDER — PREDNISONE 10 MG PO TABS: ORAL_TABLET | ORAL | Status: DC

## 2015-02-26 NOTE — Progress Notes (Signed)
Subjective:  Patient ID: Kathryn Young, female    DOB: Jan 16, 1946  Age: 69 y.o. MRN: [id number]  CC: skin sensitivity   HPI Kathryn Young presents for recurrence of itching in the ears on the lobe bilaterally rather sudden onset. This will be followed by a burning sensation of the scalp neck and shoulders. She also has a burning on the left foot. There is no rash or skin lesion. The symptoms are absent from the remainder of the torso. She points to the superior margin of the trapezius on the right near the base of the neck as the point of maximal discomfort. She says she had therapy for that last year under the care of Mr. Oxford with some relief. He also gave her a medication. Mr. Gaston Islam notes from April and June of last year were reviewed verifying that symptoms are similar however at that time numbness and tingling for the primary descriptors rather than burning and itching. She was given Depo-Medrol injection and Zovirax and the patient states that that was a successful treatment along with physical therapy for the shoulder. Kathryn Young underwent x-ray of the C-spine showing multilevel degenerative disks. However no dermatomal pattern could be established.  History Kathryn Young has a past medical history of Hyperlipidemia and Hypertension.   She has past surgical history that includes Abdominal hysterectomy; Breast surgery; and Kidney Stones.   Her family history includes Cancer in her mother.She reports that she has never smoked. She does not have any smokeless tobacco history on file. She reports that she does not drink alcohol or use illicit drugs.  Current Outpatient Prescriptions on File Prior to Visit  Medication Sig Dispense Refill  . aspirin 81 MG tablet Take 81 mg by mouth daily.    . B Complex-C (SUPER B COMPLEX PO) Take 1 tablet by mouth daily.    . Cholecalciferol (VITAMIN D3) 2000 UNITS TABS Take 1 tablet by mouth daily.    . fish oil-omega-3 fatty acids 1000 MG capsule Take 2 g by  mouth daily.    . Magnesium 250 MG TABS Take 1 tablet by mouth daily.    . simvastatin (ZOCOR) 20 MG tablet Take 1 tablet (20 mg total) by mouth daily. 90 tablet 2   No current facility-administered medications on file prior to visit.    ROS Review of Systems  Constitutional: Negative for fever, chills, diaphoresis, appetite change, fatigue and unexpected weight change.  HENT: Negative for congestion, ear pain, hearing loss, postnasal drip, rhinorrhea, sneezing, sore throat and trouble swallowing.   Eyes: Negative for pain.  Respiratory: Negative for cough, chest tightness and shortness of breath.   Cardiovascular: Negative for chest pain and palpitations.  Gastrointestinal: Negative for nausea, vomiting, abdominal pain, diarrhea and constipation.  Endocrine: Positive for polyphagia.  Genitourinary: Negative for dysuria, frequency and menstrual problem.  Musculoskeletal: Negative for joint swelling and arthralgias.  Skin: Negative for rash.  Neurological: Positive for weakness and numbness.       See HPI  Psychiatric/Behavioral: Negative for dysphoric mood and agitation.    Objective:  BP 150/77 mmHg  Pulse 72  Temp(Src) 97.3 F (36.3 C) (Oral)  Ht _0  (1.6 m)  Wt 141 lb (63.957 kg)  BMI 24.98 kg/m2  BP Readings from Last 3 Encounters:  02/26/15 150/77  10/01/14 167/84  04/09/14 138/73    Wt Readings from Last 3 Encounters:  02/26/15 141 lb (63.957 kg)  10/01/14 140 lb 6.4 oz (63.685 kg)  04/09/14 141 lb 3.2 oz (  64.048 kg)     Physical Exam  Constitutional: She is oriented to person, place, and time. She appears well-developed and well-nourished. No distress.  HENT:  Head: Normocephalic and atraumatic.  Right Ear: External ear normal.  Left Ear: External ear normal.  Nose: Nose normal.  Mouth/Throat: Oropharynx is clear and moist.  Eyes: Conjunctivae and EOM are normal. Pupils are equal, round, and reactive to light.  Neck: Normal range of motion. Neck supple.  No thyromegaly present.  Cardiovascular: Normal rate, regular rhythm and normal heart sounds.   No murmur heard. Pulmonary/Chest: Effort normal and breath sounds normal. No respiratory distress. She has no wheezes. She has no rales.  Abdominal: Soft. Bowel sounds are normal. She exhibits no distension. There is no tenderness.  Lymphadenopathy:    She has no cervical adenopathy.  Neurological: She is alert and oriented to person, place, and time. She has normal reflexes.  Skin: Skin is warm and dry.  Psychiatric: She has a normal mood and affect. Her behavior is normal. Judgment and thought content normal.    No results found for: HGBA1C  Lab Results  Component Value Date   WBC 6.1 02/26/2015   HGB 12.9 02/26/2015   HCT 41.4 02/26/2015   GLUCOSE 93 02/26/2015   CHOL 157 02/26/2015   TRIG 105 02/26/2015   HDL 70 02/26/2015   LDLCALC 66 02/26/2015   ALT 16 02/26/2015   AST 18 02/26/2015   NA 143 02/26/2015   K 4.0 02/26/2015   CL 102 02/26/2015   CREATININE 0.85 02/26/2015   BUN 12 02/26/2015   CO2 26 02/26/2015   TSH 2.280 01/26/2014    Dg Bone Density  12/04/2014   CLINICAL DATA:  Postmenopausal.  EXAM: DUAL X-RAY ABSORPTIOMETRY (DXA) FOR BONE MINERAL DENSITY  FINDINGS: AP LUMBAR SPINE  Bone Mineral Density (BMD):  0.881 g/cm2  Young Adult T-Score:  -1.5  Z-Score:  0.5  LEFT FEMUR NECK  Bone Mineral Density (BMD):  0.651 g/cm2  Young Adult T-Score: -1.8  Z-Score:  -0.1  ASSESSMENT: Patient's diagnostic category is LOW BONE MASS by WHO Criteria.  FRACTURE RISK: Moderate  FRAX: Based on the [place], the 10 year probability of a major osteoporotic fracture is 10%. The 10 year probability of a hip fracture is 1.6%.  COMPARISON: Previous examinations, the most recent dated 09/21/2013. Since that time, there has been a statistically significant 4.0% decrease in bone mineral density of the lumbar spine and a statistically insignificant 2.8% decrease in bone  mineral density of the left hip.  Effective therapies are available in the form of bisphosphonates, selective estrogen receptor modulators, biologic agents, and hormone replacement therapy (for women). All patients should ensure an adequate intake of dietary calcium (1200 mg daily) and vitamin D (800 IU daily) unless contraindicated.  All treatment decisions require clinical judgment and consideration of individual patient factors, including patient preferences, co-morbidities, previous drug use, risk factors not captured in the FRAX model (e.g., frailty, falls, vitamin D deficiency, increased bone turnover, interval significant decline in bone density) and possible under- or over-estimation of fracture risk by FRAX.  The National Osteoporosis Foundation recommends that FDA-approved medical therapies be considered in postmenopausal women and men age 49 or older with a:  1. Hip or vertebral (clinical or morphometric) fracture.  2. T-score of -2.5 or lower at the spine or hip.  3. Ten-year fracture probability by FRAX of 3% or greater for hip fracture or 20% or greater for major osteoporotic  fracture.  People with diagnosed cases of osteoporosis or at high risk for fracture should have regular bone mineral density tests. For patients eligible for Medicare, routine testing is allowed once every 2 years. The testing frequency can be increased to one year for patients who have rapidly progressing disease, those who are receiving or discontinuing medical therapy to restore bone mass, or have additional risk factors.  [employer] [facility]) Criteria:  Normal: T-scores from +1.0 to -1.0  Low Bone Mass (Osteopenia): T-scores between -1.0 and -2.5  Osteoporosis: T-scores -2.5 and below  Comparison to Reference Population:  T-score is the key measure used in the diagnosis of osteoporosis and relative risk determination for fracture. It provides a value for bone mass relative to the mean bone mass of a young adult  reference population expressed in terms of standard deviation (SD).  Z-score is the age-matched score showing the patient's values compared to a population matched for age, sex, and race. This is also expressed in terms of standard deviation. The patient may have values that compare favorably to the age-matched values and still be at increased risk for fracture.   Electronically Signed   By: Claudie Revering M.D.   On: 12/04/2014 18:01   Mm Screening Breast Tomo Bilateral  12/02/2014   CLINICAL DATA:  Screening.  EXAM: DIGITAL SCREENING BILATERAL MAMMOGRAM WITH 3D TOMO WITH CAD  COMPARISON:  Previous exam(s).  ACR Breast Density Category c: The breast tissue is heterogeneously dense, which may obscure small masses.  FINDINGS: There are no findings suspicious for malignancy. Images were processed with CAD.  IMPRESSION: No mammographic evidence of malignancy. A result letter of this screening mammogram will be mailed directly to the patient.  RECOMMENDATION: Screening mammogram in one year. (Code:SM-B-01Y)  BI-RADS CATEGORY  1: Negative.   Electronically Signed   By: Margarette Canada M.D.   On: 12/02/2014 13:48    Assessment & Plan:   Miamarie was seen today for skin sensitivity.  Diagnoses and all orders for this visit:  Neuralgia and neuritis Orders: -     POCT CBC -     CMP14+EGFR -     Cancel: POCT SEDIMENTATION RATE -     Sedimentation rate  Hyperlipidemia Orders: -     Lipid panel  Other orders -     predniSONE (DELTASONE) 10 MG tablet; Take 5 daily for 3 days followed by 4,3,2 and 1 for 3 days each.   I have discontinued Ms. Cawley's Melatonin, diclofenac, acyclovir, and meloxicam. I am also having her start on predniSONE. Additionally, I am having her maintain her fish oil-omega-3 fatty acids, aspirin, Magnesium, B Complex-C (SUPER B COMPLEX PO), Vitamin D3, and simvastatin.  Meds ordered this encounter  Medications  . predniSONE (DELTASONE) 10 MG tablet    Sig: Take 5 daily for 3 days  followed by 4,3,2 and 1 for 3 days each.    Dispense:  45 tablet    Refill:  0     Follow-up: Return if symptoms worsen or fail to improve.  Claretta Fraise, M.D.

## 2015-02-27 LAB — CMP14+EGFR
ALT: 16 IU/L (ref 0–32)
AST: 18 IU/L (ref 0–40)
Albumin/Globulin Ratio: 1.7 (ref 1.1–2.5)
Albumin: 4.3 g/dL (ref 3.6–4.8)
Alkaline Phosphatase: 95 IU/L (ref 39–117)
BILIRUBIN TOTAL: 0.5 mg/dL (ref 0.0–1.2)
BUN/Creatinine Ratio: 14 (ref 11–26)
BUN: 12 mg/dL (ref 8–27)
CHLORIDE: 102 mmol/L (ref 97–108)
CO2: 26 mmol/L (ref 18–29)
Calcium: 9.4 mg/dL (ref 8.7–10.3)
Creatinine, Ser: 0.85 mg/dL (ref 0.57–1.00)
GFR calc Af Amer: 81 mL/min/{1.73_m2} (ref >59)
GFR calc non Af Amer: 71 mL/min/{1.73_m2} (ref >59)
GLUCOSE: 93 mg/dL (ref 65–99)
Globulin, Total: 2.6 g/dL (ref 1.5–4.5)
Potassium: 4 mmol/L (ref 3.5–5.2)
Sodium: 143 mmol/L (ref 134–144)
Total Protein: 6.9 g/dL (ref 6.0–8.5)

## 2015-02-27 LAB — SEDIMENTATION RATE: Sed Rate: 8 mm/hr (ref 0–40)

## 2015-02-27 LAB — LIPID PANEL
Chol/HDL Ratio: 2.2 ratio units (ref 0.0–4.4)
Cholesterol, Total: 157 mg/dL (ref 100–199)
HDL: 70 mg/dL (ref >39)
LDL CALC: 66 mg/dL (ref 0–99)
TRIGLYCERIDES: 105 mg/dL (ref 0–149)
VLDL Cholesterol Cal: 21 mg/dL (ref 5–40)

## 2015-03-02 ENCOUNTER — Telehealth: Payer: Self-pay | Admitting: *Deleted

## 2015-03-02 NOTE — Telephone Encounter (Signed)
-----   Message from Claretta Fraise, MD sent at 03/01/2015  7:11 PM EDT ----- Theophilus Bones,    Your lab result is normal.Some minor variations that are not significant are commonly marked abnormal, but do not represent any medical problem for you.  Best regards, Claretta Fraise, M.D.

## 2015-03-02 NOTE — Telephone Encounter (Signed)
Pt notified of results Verbalizes understanding 

## 2015-03-22 ENCOUNTER — Telehealth: Payer: Self-pay | Admitting: Family Medicine

## 2015-03-22 DIAGNOSIS — M542 Cervicalgia: Secondary | ICD-10-CM

## 2015-03-22 NOTE — Telephone Encounter (Signed)
Patient states that she is still having same symptoms of when she was here on 5-13. Her head is a little better but everything else is still the same. Also states that she had this one year ago and was referred to therapy and all pain went away. She is not sure if she needs therapy again or if she should do something else. Please advise and route to Pool B

## 2015-03-22 NOTE — Telephone Encounter (Signed)
Refer to physical therapy for neck pain.

## 2015-03-22 NOTE — Telephone Encounter (Signed)
Patient notified. Referral made.

## 2015-04-05 ENCOUNTER — Ambulatory Visit: Payer: Medicare Other | Attending: Family Medicine | Admitting: Physical Therapy

## 2015-04-05 DIAGNOSIS — M542 Cervicalgia: Secondary | ICD-10-CM

## 2015-04-05 NOTE — Therapy (Signed)
[facility] [place], [place], [address] Phone: [phone]   Fax:  [phone]  Physical Therapy Evaluation  Patient Details  Name: Kathryn Young MRN: [id number] Date of Birth: 12-04-1945 Referring Provider:  Claretta Fraise, MD  Encounter Date: 04/05/2015      PT End of Session - 04/05/15 1417    Visit Number 1   Number of Visits 12   Date for PT Re-Evaluation 05/17/15   PT Start Time 0148   PT Stop Time 0242   PT Time Calculation (min) 54 min   Activity Tolerance Patient tolerated treatment well   Behavior During Therapy [facility] for tasks assessed/performed      Past Medical History  Diagnosis Date  . Hyperlipidemia   . Hypertension     Past Surgical History  Procedure Laterality Date  . Abdominal hysterectomy    . Breast surgery    . Kidney stones      There were no vitals filed for this visit.  Visit Diagnosis:  Neck pain - Plan: PT plan of care cert/re-cert      Subjective Assessment - 04/05/15 1414    Subjective My neck did great after physical therapy but then the pain came back.   Patient Stated Goals Get out of pain like before.   Currently in Pain? Yes   Pain Score 9    Pain Location Neck   Pain Orientation Right   Pain Descriptors / Indicators Burning   Pain Type Chronic pain   Pain Onset More than a month ago   Pain Frequency Constant   Aggravating Factors  ADL's.   Pain Relieving Factors Medication.   Effect of Pain on Daily Activities Not able to accomplish ADL's like before.   Multiple Pain Sites No            [facility] PT Assessment - 04/05/15 0001    Assessment   Medical Diagnosis Neck pain.   Onset Date/Surgical Date --  Ongoing.   Precautions   Precautions None   Restrictions   Weight Bearing Restrictions No   Balance Screen   Has the patient fallen in the past 6 months No   Has the patient had a decrease in activity level because of a fear of falling?  No   Is the  patient reluctant to leave their home because of a fear of falling?  No   Home [employer] residence   Prior Function   Level of Independence Independent   Cognition   Overall Cognitive Status Within Functional Limits for tasks assessed   Posture/Postural Control   Posture/Postural Control Postural limitations   Postural Limitations Rounded Shoulders;Forward head   ROM / Strength   AROM / PROM / Strength AROM;Strength   AROM   Overall AROM Comments Bilateral active cervical rotation= 75 degrees.   Strength   Overall Strength Comments Normal bilateral UE strength.   Palpation   Palpation comment --  Tender right levator scapulae area.   Special Tests    Special Tests --  Normal bilateral UE DTR's.                   [facility] Adult PT Treatment/Exercise - 04/05/15 0001    Modalities   Modalities Electrical Stimulation;Moist Heat   Moist Heat Therapy   Number Minutes Moist Heat 20 Minutes   Moist Heat Location --  Neck.   Acupuncturist Location --  Right levator scapulae  region.   Electrical Stimulation Action --  80-150 HZ (5 seconds on and 5 seconds off).   Electrical Stimulation Goals Pain   Manual Therapy   Manual therapy comments Right levator scapulae release x 8 minutes.                  PT Short Term Goals - 2015-04-18 1449    PT SHORT TERM GOAL #1   Title Ind with HEP.   Time 2   Period Weeks   Status New           PT Long Term Goals - 04/18/2015 1449    PT LONG TERM GOAL #1   Title Evening pain-level not > 3/10.   Time 6   Period Weeks   Status New   PT LONG TERM GOAL #2   Title Eliminate burning sensation.   PT LONG TERM GOAL #3   Title Average daily pain-level not > 3/10.               Plan - Apr 18, 2015 1421    Clinical Impression Statement The patient has had ongoing neck pain but it resolved nicely with physical therapy only to return.  Her pain-level is a  high 9/10 today with pain at highs at night.  Additionally, she reports a burning sensation on her scalp and ears and an itching sensation.   Pt will benefit from skilled therapeutic intervention in order to improve on the following deficits Pain;Decreased activity tolerance   Rehab Potential Excellent   PT Frequency 3x / week   PT Duration 6 weeks   PT Treatment/Interventions Moist Heat;Ultrasound;Therapeutic exercise;Patient/family education;Manual techniques   PT Next Visit Plan Modalities and STW/M to affected cervical region.  Patient may benefit for costo-vertebral mobs as well.  May try intermittent traction at 18#.          G-Codes - 04-18-15 1450    Functional Assessment Tool Used FOTO.   Functional Limitation Mobility: Walking and moving around   Mobility: Walking and Moving Around Current Status [phone]) At least 20 percent but less than 40 percent impaired, limited or restricted   Mobility: Walking and Moving Around Goal Status [phone]) At least 1 percent but less than 20 percent impaired, limited or restricted       Problem List Patient Active Problem List   Diagnosis Date Noted  . Hyperlipidemia 06/03/2013    Flynn Gwyn, [place] MPT April 18, 2015, 2:53 PM  [facility] [address] [place], [place], [address] Phone: [phone]   Fax:  [phone]

## 2015-04-05 NOTE — Patient Instructions (Signed)
Reviewed cervical range of motion exercises with patient.

## 2015-04-07 ENCOUNTER — Ambulatory Visit: Payer: Medicare Other | Admitting: Physical Therapy

## 2015-04-07 DIAGNOSIS — M542 Cervicalgia: Secondary | ICD-10-CM

## 2015-04-07 NOTE — Therapy (Signed)
[facility] [place], [place], [address] Phone: [phone]   Fax:  [phone]  Physical Therapy Treatment  Patient Details  Name: Kathryn Young MRN: [id number] Date of Birth: 1946-03-21 Referring Provider:  Wardell Honour, MD  Encounter Date: 04/07/2015      PT End of Session - 04/07/15 1124    Visit Number 2   Number of Visits 12   Date for PT Re-Evaluation 05/17/15   PT Start Time 1120   PT Stop Time 1212   PT Time Calculation (min) 52 min   Activity Tolerance Patient tolerated treatment well   Behavior During Therapy [facility] for tasks assessed/performed      Past Medical History  Diagnosis Date  . Hyperlipidemia   . Hypertension     Past Surgical History  Procedure Laterality Date  . Abdominal hysterectomy    . Breast surgery    . Kidney stones      There were no vitals filed for this visit.  Visit Diagnosis:  Neck pain      Subjective Assessment - 04/07/15 1123    Subjective Reports only burning sensation in L scapular region now and goes into her scalp. Also has burning in her feet that wakes her at night. Burning sensations are always worse at night. States that burning sensation feels like you have a sunburn but go back in the sun the next day. Also have itching sensation in the palmar lateral side of both hands.   Patient Stated Goals Get out of pain like before.   Currently in Pain? Yes   Pain Score 8    Pain Location Neck   Pain Orientation Right   Pain Descriptors / Indicators Burning   Pain Type Chronic pain   Pain Onset More than a month ago            [facility] PT Assessment - 04/07/15 0001    Assessment   Medical Diagnosis Neck pain.                     [facility] Adult PT Treatment/Exercise - 04/07/15 0001    Modalities   Modalities Electrical Stimulation;Traction   Acupuncturist Location R superior scapulae   Electrical Stimulation  Action Pre-Mod   Electrical Stimulation Parameters 80-150 Hz x15 min   Electrical Stimulation Goals Pain   Traction   Type of Traction Cervical   Min (lbs) 5   Max (lbs) 18   Hold Time 99   Rest Time 5   Time 15   Manual Therapy   Manual Therapy Myofascial release   Myofascial Release STW to R superior border of scapula to decrease tightness and pain                  PT Short Term Goals - 04/07/15 1236    PT SHORT TERM GOAL #1   Title Ind with HEP.   Time 2   Period Weeks   Status On-going           PT Long Term Goals - 04/07/15 1236    PT LONG TERM GOAL #1   Title Evening pain-level not > 3/10.   Time 6   Period Weeks   Status On-going   PT LONG TERM GOAL #2   Title Eliminate burning sensation.   Status On-going   PT LONG TERM GOAL #3   Title Average daily pain-level not > 3/10.   Status On-going  Plan - 04/07/15 1203    Clinical Impression Statement Patient tolerated treatment well today without complaint of increased burning sensation. Normal modalties response noted following removal of the modalities. Moderate tightness noted in R Upper Trap and Levator Scapulae today and patient was able to tolerate moderate pressure during manaul therapy. Denied pain or burning sensation following end of treatment only the rims of her ears were itching which she reports that she applies cortizone itch creme to them daily in the mornings.   Pt will benefit from skilled therapeutic intervention in order to improve on the following deficits Pain;Decreased activity tolerance   Rehab Potential Excellent   PT Frequency 3x / week   PT Duration 6 weeks   PT Treatment/Interventions Moist Heat;Ultrasound;Therapeutic exercise;Patient/family education;Manual techniques   PT Next Visit Plan Continue per MPT POC. Assess response to MPT discretion cervical traction next treatment.   Consulted and Agree with Plan of Care Patient        Problem  List Patient Active Problem List   Diagnosis Date Noted  . Hyperlipidemia 06/03/2013    Wynelle Fanny, PTA 04/07/2015, 12:37 PM  [facility] [address] [place], [place], [address] Phone: [phone]   Fax:  [phone]

## 2015-04-09 ENCOUNTER — Ambulatory Visit: Payer: Medicare Other | Admitting: Physical Therapy

## 2015-04-09 ENCOUNTER — Encounter: Payer: Self-pay | Admitting: Physical Therapy

## 2015-04-09 DIAGNOSIS — M542 Cervicalgia: Secondary | ICD-10-CM

## 2015-04-09 NOTE — Therapy (Signed)
[facility] [place], [place], [address] Phone: [phone]   Fax:  [phone]  Physical Therapy Treatment  Patient Details  Name: Kathryn Young MRN: [id number] Date of Birth: 1946/02/28 Referring Provider:  Wardell Honour, MD  Encounter Date: 04/09/2015      PT End of Session - 04/09/15 0959    Visit Number 3   Number of Visits 12   Date for PT Re-Evaluation 05/17/15   PT Start Time 0948   PT Stop Time 1039   PT Time Calculation (min) 51 min   Activity Tolerance Patient tolerated treatment well   Behavior During Therapy [facility] for tasks assessed/performed      Past Medical History  Diagnosis Date  . Hyperlipidemia   . Hypertension     Past Surgical History  Procedure Laterality Date  . Abdominal hysterectomy    . Breast surgery    . Kidney stones      There were no vitals filed for this visit.  Visit Diagnosis:  Neck pain      Subjective Assessment - 04/09/15 0958    Subjective States that yesterday the pulling sensation in the R shoulder only occured 3 times which is an improvement but continues to have burning and itching in ears, scalp and shoulder.   Patient Stated Goals Get out of pain like before.   Currently in Pain? Yes   Pain Score 8    Pain Location Neck   Pain Orientation Right   Pain Descriptors / Indicators Burning   Pain Type Chronic pain   Pain Onset More than a month ago            [facility] PT Assessment - 04/09/15 0001    Assessment   Medical Diagnosis Neck pain.                     [facility] Adult PT Treatment/Exercise - 04/09/15 0001    Modalities   Modalities Electrical Stimulation;Traction   Acupuncturist Location R superior scapulae   Electrical Stimulation Action Pre-Mod   Electrical Stimulation Parameters 80-150 Hz x15 min   Electrical Stimulation Goals Pain   Traction   Type of Traction Cervical   Min (lbs) 5   Max  (lbs) 18   Hold Time 99   Rest Time 5   Time 15   Manual Therapy   Manual Therapy Myofascial release   Myofascial Release STW to R superior border of scapula/ UT/ Levator Scapulae to improve blood flow and oxygen to the nerves in the region                  PT Short Term Goals - 04/07/15 1236    PT SHORT TERM GOAL #1   Title Ind with HEP.   Time 2   Period Weeks   Status On-going           PT Long Term Goals - 04/07/15 1236    PT LONG TERM GOAL #1   Title Evening pain-level not > 3/10.   Time 6   Period Weeks   Status On-going   PT LONG TERM GOAL #2   Title Eliminate burning sensation.   Status On-going   PT LONG TERM GOAL #3   Title Average daily pain-level not > 3/10.   Status On-going               Plan - 04/09/15 1042    Clinical Impression  Statement Patient tolerated treatment well without complaint of increased pain or burning sensation. Normal modalities response noted following removal of the modalities. Decreased tightness observed in the R UT region and patient was able to tolerate moderate pressure and had no burning sensations during STW. Experienced burning sensation iin the lower thoracic musculature on both sides following traction but no pain.   Pt will benefit from skilled therapeutic intervention in order to improve on the following deficits Pain;Decreased activity tolerance   Rehab Potential Excellent   PT Frequency 3x / week   PT Duration 6 weeks   PT Treatment/Interventions Moist Heat;Ultrasound;Therapeutic exercise;Patient/family education;Manual techniques   PT Next Visit Plan Continue per MPT POC. Assess response to MPT discretion cervical traction next treatment.   Consulted and Agree with Plan of Care Patient        Problem List Patient Active Problem List   Diagnosis Date Noted  . Hyperlipidemia 06/03/2013    Wynelle Fanny, PTA 04/09/2015, 10:45 AM  [facility] [address] [place], [place], [address] Phone: [phone]   Fax:  [phone]

## 2015-04-12 ENCOUNTER — Encounter: Payer: Self-pay | Admitting: Physical Therapy

## 2015-04-12 ENCOUNTER — Ambulatory Visit: Payer: Medicare Other | Admitting: Physical Therapy

## 2015-04-12 DIAGNOSIS — M542 Cervicalgia: Secondary | ICD-10-CM

## 2015-04-12 NOTE — Therapy (Signed)
[facility] [place], [place], [address] Phone: [phone]   Fax:  [phone]  Physical Therapy Treatment  Patient Details  Name: Kathryn Young MRN: [id number] Date of Birth: 1946-08-07 Referring Provider:  Wardell Honour, MD  Encounter Date: 04/12/2015      PT End of Session - 04/12/15 1349    Visit Number 4   Number of Visits 12   Date for PT Re-Evaluation 05/17/15   PT Start Time 1315   PT Stop Time 1404   PT Time Calculation (min) 49 min   Activity Tolerance Patient tolerated treatment well   Behavior During Therapy [facility] for tasks assessed/performed      Past Medical History  Diagnosis Date  . Hyperlipidemia   . Hypertension     Past Surgical History  Procedure Laterality Date  . Abdominal hysterectomy    . Breast surgery    . Kidney stones      There were no vitals filed for this visit.  Visit Diagnosis:  Neck pain      Subjective Assessment - 04/12/15 1335    Subjective Patient reported 40% improvement thus far and has less of a pulling in right shoulder, yet no improvement with burning sensation   Patient Stated Goals Get out of pain like before.   Currently in Pain? Yes   Pain Score 7    Pain Location Neck   Pain Orientation Right   Pain Descriptors / Indicators Burning   Pain Onset More than a month ago   Pain Frequency Constant   Aggravating Factors  increased activity   Pain Relieving Factors medication                         [facility] Adult PT Treatment/Exercise - 04/12/15 0001    Moist Heat Therapy   Number Minutes Moist Heat 15 Minutes   Moist Heat Location Cervical;Shoulder  RIGHT   Electrical Stimulation   Electrical Stimulation Location Right superior scapulae   Electrical Stimulation Action premod   Electrical Stimulation Parameters 80-150hz    Electrical Stimulation Goals Pain   Traction   Type of Traction Cervical   Min (lbs) 5   Max (lbs) 18   Hold  Time 99   Rest Time 5   Time 15   Manual Therapy   Manual Therapy Myofascial release   Myofascial Release STW to Right superior border of scapula/ UT/ Levator Scapulae to improve blood flow and oxygen to the nerves in the region                  PT Short Term Goals - 04/07/15 1236    PT SHORT TERM GOAL #1   Title Ind with HEP.   Time 2   Period Weeks   Status On-going           PT Long Term Goals - 04/07/15 1236    PT LONG TERM GOAL #1   Title Evening pain-level not > 3/10.   Time 6   Period Weeks   Status On-going   PT LONG TERM GOAL #2   Title Eliminate burning sensation.   Status On-going   PT LONG TERM GOAL #3   Title Average daily pain-level not > 3/10.   Status On-going               Plan - 04/12/15 1351    Clinical Impression Statement Patient tolerated treatment very well today and reported feeling  40% better overall. Unable to meet any further goals due to pain limitations. Patient is going to call MD to request to see a nurologist for ongoing nerve symptoms due to no iprovement with the burning sensation. Disscussed with MPT for patient to see MD and to continue or DC pending MD referral.   Pt will benefit from skilled therapeutic intervention in order to improve on the following deficits Pain;Decreased activity tolerance   Rehab Potential Excellent   PT Frequency 3x / week   PT Duration 6 weeks   PT Treatment/Interventions Moist Heat;Ultrasound;Therapeutic exercise;Patient/family education;Manual techniques   PT Next Visit Plan Continue per MD refrerral for neurologist or cont therapy   Consulted and Agree with Plan of Care Patient        Problem List Patient Active Problem List   Diagnosis Date Noted  . Hyperlipidemia 06/03/2013    Rip Hawes P, PTA 04/12/2015, 2:11 PM  [facility] [address] [place], [place], [address] Phone: [phone]   Fax:  [phone]

## 2015-04-13 ENCOUNTER — Encounter: Payer: Self-pay | Admitting: Family Medicine

## 2015-04-13 ENCOUNTER — Ambulatory Visit (INDEPENDENT_AMBULATORY_CARE_PROVIDER_SITE_OTHER): Payer: Medicare Other | Admitting: Family Medicine

## 2015-04-13 ENCOUNTER — Telehealth: Payer: Self-pay | Admitting: Family Medicine

## 2015-04-13 VITALS — BP 152/71 | HR 74 | Temp 97.6°F | Ht 63.0 in | Wt 145.2 lb

## 2015-04-13 DIAGNOSIS — R203 Hyperesthesia: Secondary | ICD-10-CM | POA: Diagnosis not present

## 2015-04-13 NOTE — Patient Instructions (Signed)
Discontinue ALL meds and supplements until further notice

## 2015-04-13 NOTE — Progress Notes (Signed)
Subjective:  Patient ID: Kathryn Young, female    DOB: 03/14/1946  Age: 69 y.o. MRN: [id number]  CC: skin sensitivity   HPI Kathryn Young presents for continued tingling and itching. She notices it primarily behind the ears but she also says that her entire back feels like it's been skinned up. However there is no lesion. Additionally there is pain and discomfort at her waistband for her panties and underneath her bra. This was diminished somewhat temporarily by the previous treatment,prednisone, see previous office visit. However symptoms have returned and have become more widespread now than they were at that time. Also having odd odors noted and the roof of her mouth feels "rough."  History Tinamarie has a past medical history of Hyperlipidemia and Hypertension.   She has past surgical history that includes Abdominal hysterectomy; Breast surgery; and Kidney Stones.   Her family history includes Cancer in her mother.She reports that she has never smoked. She does not have any smokeless tobacco history on file. She reports that she does not drink alcohol or use illicit drugs.  Outpatient Prescriptions Prior to Visit  Medication Sig Dispense Refill  . aspirin 81 MG tablet Take 81 mg by mouth daily.    . B Complex-C (SUPER B COMPLEX PO) Take 1 tablet by mouth daily.    . Cholecalciferol (VITAMIN D3) 2000 UNITS TABS Take 1 tablet by mouth daily.    . fish oil-omega-3 fatty acids 1000 MG capsule Take 2 g by mouth daily.    . simvastatin (ZOCOR) 20 MG tablet Take 1 tablet (20 mg total) by mouth daily. 90 tablet 2  . Magnesium 250 MG TABS Take 1 tablet by mouth daily.    . predniSONE (DELTASONE) 10 MG tablet Take 5 daily for 3 days followed by 4,3,2 and 1 for 3 days each. (Patient not taking: Reported on 04/13/2015) 45 tablet 0   No facility-administered medications prior to visit.    ROS Review of Systems  Constitutional: Negative for fever, chills, diaphoresis, appetite change, fatigue and  unexpected weight change.  HENT: Positive for mouth sores (roof of mouth feels rough/.). Negative for congestion, ear pain, hearing loss, postnasal drip, rhinorrhea, sneezing, sore throat and trouble swallowing.   Eyes: Negative for pain.  Respiratory: Negative for cough, chest tightness and shortness of breath.   Cardiovascular: Negative for chest pain and palpitations.  Gastrointestinal: Negative for nausea, vomiting, abdominal pain, diarrhea and constipation.  Genitourinary: Negative for dysuria, frequency and menstrual problem.  Musculoskeletal: Negative for joint swelling and arthralgias.  Skin: Negative for color change, pallor, rash and wound.  Neurological: Negative for weakness and numbness.       See HPI  Psychiatric/Behavioral: Negative for dysphoric mood and agitation.    Objective:  BP 152/71 mmHg  Pulse 74  Temp(Src) 97.6 F (36.4 C) (Oral)  Ht 5\' 3"  (1.6 m)  Wt 145 lb 3.2 oz (65.862 kg)  BMI 25.73 kg/m2  BP Readings from Last 3 Encounters:  04/13/15 152/71  02/26/15 150/77  10/01/14 167/84    Wt Readings from Last 3 Encounters:  04/13/15 145 lb 3.2 oz (65.862 kg)  02/26/15 141 lb (63.957 kg)  10/01/14 140 lb 6.4 oz (63.685 kg)     Physical Exam  Constitutional: She is oriented to person, place, and time. She appears well-developed and well-nourished. No distress.  HENT:  Head: Normocephalic and atraumatic.  Right Ear: External ear normal.  Left Ear: External ear normal.  Nose: Nose normal.  Mouth/Throat: Oropharynx  is clear and moist.  Eyes: Conjunctivae and EOM are normal. Pupils are equal, round, and reactive to light.  Neck: Normal range of motion. Neck supple. No thyromegaly present.  Cardiovascular: Normal rate, regular rhythm and normal heart sounds.   No murmur heard. Pulmonary/Chest: Effort normal and breath sounds normal. No respiratory distress. She has no wheezes. She has no rales.  Abdominal: Soft. Bowel sounds are normal. She exhibits no  distension. There is no tenderness.  Lymphadenopathy:    She has no cervical adenopathy.  Neurological: She is alert and oriented to person, place, and time. She has normal reflexes.  Skin: Skin is warm and dry. No rash noted. No erythema. No pallor.  Psychiatric: She has a normal mood and affect. Her behavior is normal. Judgment and thought content normal.    No results found for: HGBA1C  Lab Results  Component Value Date   WBC 6.1 02/26/2015   HGB 12.9 02/26/2015   HCT 41.4 02/26/2015   GLUCOSE 93 02/26/2015   CHOL 157 02/26/2015   TRIG 105 02/26/2015   HDL 70 02/26/2015   LDLCALC 66 02/26/2015   ALT 16 02/26/2015   AST 18 02/26/2015   NA 143 02/26/2015   K 4.0 02/26/2015   CL 102 02/26/2015   CREATININE 0.85 02/26/2015   BUN 12 02/26/2015   CO2 26 02/26/2015   TSH 2.280 01/26/2014    Dg Bone Density  12/04/2014   CLINICAL DATA:  Postmenopausal.  EXAM: DUAL X-RAY ABSORPTIOMETRY (DXA) FOR BONE MINERAL DENSITY  FINDINGS: AP LUMBAR SPINE  Bone Mineral Density (BMD):  0.881 g/cm2  Young Adult T-Score:  -1.5  Z-Score:  0.5  LEFT FEMUR NECK  Bone Mineral Density (BMD):  0.651 g/cm2  Young Adult T-Score: -1.8  Z-Score:  -0.1  ASSESSMENT: Patient's diagnostic category is LOW BONE MASS by WHO Criteria.  FRACTURE RISK: Moderate  FRAX: Based on the [place], the 10 year probability of a major osteoporotic fracture is 10%. The 10 year probability of a hip fracture is 1.6%.  COMPARISON: Previous examinations, the most recent dated 09/21/2013. Since that time, there has been a statistically significant 4.0% decrease in bone mineral density of the lumbar spine and a statistically insignificant 2.8% decrease in bone mineral density of the left hip.  Effective therapies are available in the form of bisphosphonates, selective estrogen receptor modulators, biologic agents, and hormone replacement therapy (for women). All patients should ensure an adequate intake of dietary  calcium (1200 mg daily) and vitamin D (800 IU daily) unless contraindicated.  All treatment decisions require clinical judgment and consideration of individual patient factors, including patient preferences, co-morbidities, previous drug use, risk factors not captured in the FRAX model (e.g., frailty, falls, vitamin D deficiency, increased bone turnover, interval significant decline in bone density) and possible under- or over-estimation of fracture risk by FRAX.  The National Osteoporosis Foundation recommends that FDA-approved medical therapies be considered in postmenopausal women and men age 18 or older with a:  1. Hip or vertebral (clinical or morphometric) fracture.  2. T-score of -2.5 or lower at the spine or hip.  3. Ten-year fracture probability by FRAX of 3% or greater for hip fracture or 20% or greater for major osteoporotic fracture.  People with diagnosed cases of osteoporosis or at high risk for fracture should have regular bone mineral density tests. For patients eligible for Medicare, routine testing is allowed once every 2 years. The testing frequency can be increased to one year for  patients who have rapidly progressing disease, those who are receiving or discontinuing medical therapy to restore bone mass, or have additional risk factors.  [employer] [facility]) Criteria:  Normal: T-scores from +1.0 to -1.0  Low Bone Mass (Osteopenia): T-scores between -1.0 and -2.5  Osteoporosis: T-scores -2.5 and below  Comparison to Reference Population:  T-score is the key measure used in the diagnosis of osteoporosis and relative risk determination for fracture. It provides a value for bone mass relative to the mean bone mass of a young adult reference population expressed in terms of standard deviation (SD).  Z-score is the age-matched score showing the patient's values compared to a population matched for age, sex, and race. This is also expressed in terms of standard deviation. The patient may have  values that compare favorably to the age-matched values and still be at increased risk for fracture.   Electronically Signed   By: Claudie Revering M.D.   On: 12/04/2014 18:01   Mm Screening Breast Tomo Bilateral  12/02/2014   CLINICAL DATA:  Screening.  EXAM: DIGITAL SCREENING BILATERAL MAMMOGRAM WITH 3D TOMO WITH CAD  COMPARISON:  Previous exam(s).  ACR Breast Density Category c: The breast tissue is heterogeneously dense, which may obscure small masses.  FINDINGS: There are no findings suspicious for malignancy. Images were processed with CAD.  IMPRESSION: No mammographic evidence of malignancy. A result letter of this screening mammogram will be mailed directly to the patient.  RECOMMENDATION: Screening mammogram in one year. (Code:SM-B-01Y)  BI-RADS CATEGORY  1: Negative.   Electronically Signed   By: Margarette Canada M.D.   On: 12/02/2014 13:48    Assessment & Plan:   Alta was seen today for skin sensitivity.  Diagnoses and all orders for this visit:  Hyperesthesia Orders: -     CBC with Differential/Platelet -     Sedimentation rate -     Protein electrophoresis, serum  I have discontinued Ms. Cimino's predniSONE. I am also having her maintain her fish oil-omega-3 fatty acids, aspirin, Magnesium, B Complex-C (SUPER B COMPLEX PO), Vitamin D3, and simvastatin.  No orders of the defined types were placed in this encounter.    Discontinue ALL meds  and supplements until further notice   Follow-up: Return in about 2 weeks (around 04/27/2015).  Claretta Fraise, M.D.

## 2015-04-13 NOTE — Telephone Encounter (Signed)
appt scheduled for pt to come in to discuss

## 2015-04-14 ENCOUNTER — Encounter: Payer: Self-pay | Admitting: Physical Therapy

## 2015-04-14 ENCOUNTER — Ambulatory Visit: Payer: Medicare Other | Admitting: Physical Therapy

## 2015-04-14 DIAGNOSIS — M542 Cervicalgia: Secondary | ICD-10-CM | POA: Diagnosis not present

## 2015-04-14 LAB — CBC WITH DIFFERENTIAL/PLATELET
BASOS ABS: 0 10*3/uL (ref 0.0–0.2)
Basos: 0 %
EOS (ABSOLUTE): 0.1 10*3/uL (ref 0.0–0.4)
Eos: 1 %
Hematocrit: 38.8 % (ref 34.0–46.6)
Hemoglobin: 12.7 g/dL (ref 11.1–15.9)
Immature Grans (Abs): 0 10*3/uL (ref 0.0–0.1)
Immature Granulocytes: 0 %
LYMPHS ABS: 3 10*3/uL (ref 0.7–3.1)
Lymphs: 42 %
MCH: 30.9 pg (ref 26.6–33.0)
MCHC: 32.7 g/dL (ref 31.5–35.7)
MCV: 94 fL (ref 79–97)
Monocytes Absolute: 0.5 10*3/uL (ref 0.1–0.9)
Monocytes: 7 %
NEUTROS PCT: 50 %
Neutrophils Absolute: 3.5 10*3/uL (ref 1.4–7.0)
Platelets: 242 10*3/uL (ref 150–379)
RBC: 4.11 x10E6/uL (ref 3.77–5.28)
RDW: 13.4 % (ref 12.3–15.4)
WBC: 7.1 10*3/uL (ref 3.4–10.8)

## 2015-04-14 LAB — PROTEIN ELECTROPHORESIS, SERUM
A/G Ratio: 1.3 (ref 0.7–1.7)
ALPHA 2: 0.8 g/dL (ref 0.4–1.0)
Albumin ELP: 3.7 g/dL (ref 2.9–4.4)
Alpha 1: 0.1 g/dL (ref 0.0–0.4)
Beta: 1 g/dL (ref 0.7–1.3)
GAMMA GLOBULIN: 0.9 g/dL (ref 0.4–1.8)
Globulin, Total: 2.8 g/dL (ref 2.2–3.9)
TOTAL PROTEIN: 6.5 g/dL (ref 6.0–8.5)

## 2015-04-14 LAB — SEDIMENTATION RATE: Sed Rate: 6 mm/hr (ref 0–40)

## 2015-04-14 NOTE — Therapy (Signed)
[facility] [place], [place], [address] Phone: [phone]   Fax:  [phone]  Physical Therapy Treatment  Patient Details  Name: Kathryn Young MRN: [id number] Date of Birth: 1946/04/09 Referring Provider:  Wardell Honour, MD  Encounter Date: 04/14/2015      PT End of Session - 04/14/15 1134    Visit Number 5   Number of Visits 12   PT Start Time 2542   PT Stop Time 1209   PT Time Calculation (min) 48 min   Activity Tolerance Patient tolerated treatment well   Behavior During Therapy Kathryn Young for tasks assessed/performed      Past Medical History  Diagnosis Date  . Hyperlipidemia   . Hypertension     Past Surgical History  Procedure Laterality Date  . Abdominal hysterectomy    . Breast surgery    . Kidney stones      There were no vitals filed for this visit.  Visit Diagnosis:  Neck pain      Subjective Assessment - 04/14/15 1123    Subjective Patient went to Dr. Livia Snellen and he wants patient to continue therapy and he will do blood work and had her stop all medications and pills to rule out possible medications that could be causing burning symptoms and to see him back in three weeks.   Patient Stated Goals Get out of pain like before.   Currently in Pain? Yes   Pain Score 6    Pain Location Neck   Pain Orientation Right   Pain Descriptors / Indicators Burning  pulling   Pain Type Chronic pain   Pain Onset More than a month ago   Pain Frequency Constant   Aggravating Factors  being out in sun or hot water from shower   Pain Relieving Factors cream                         [facility] Adult PT Treatment/Exercise - 04/14/15 0001    Electrical Stimulation   Electrical Stimulation Location Right superior scapulae   Electrical Stimulation Action premod   Electrical Stimulation Parameters 80-'150hz'    Electrical Stimulation Goals Pain   Traction   Type of Traction Cervical   Min (lbs) 5   Max (lbs) 19   Hold Time 99   Rest Time 5   Time 15   Manual Therapy   Manual Therapy Myofascial release   Myofascial Release STW to Right superior border of scapula/ UT/ Levator Scapulae to improve blood flow and oxygen to the nerves in the region                PT Education - 04/14/15 1143    Education provided Yes   Education Details HEP-ROM/stretch   Person(s) Educated Patient   Methods Explanation;Demonstration;Handout   Comprehension Verbalized understanding;Returned demonstration          PT Short Term Goals - 04/14/15 1143    PT SHORT TERM GOAL #1   Title Ind with HEP.   Time 2   Period Weeks   Status Achieved           PT Long Term Goals - 04/07/15 1236    PT LONG TERM GOAL #1   Title Evening pain-level not > 3/10.   Time 6   Period Weeks   Status On-going   PT LONG TERM GOAL #2   Title Eliminate burning sensation.   Status On-going   PT LONG TERM  GOAL #3   Title Average daily pain-level not > 3/10.   Status On-going               Plan - 04/14/15 1136    Clinical Impression Statement Patient slowly progressing today, has reported one degree pain level improvement. Patient has less pulling in right shoulder today. No improvement with burning sensation at this time, yet will be going back to MD in three weeks. STG #1 met LTGoals ongoing due to limitations with symptoms. Increased cervical traction to 19# per MPT approval. Felt good after treatment today.   Pt will benefit from skilled therapeutic intervention in order to improve on the following deficits Pain;Decreased activity tolerance   Rehab Potential Excellent   PT Frequency 3x / week   PT Duration 6 weeks   PT Treatment/Interventions Moist Heat;Ultrasound;Therapeutic exercise;Patient/family education;Manual techniques   PT Next Visit Plan Continue with POC per MD Stacks   Consulted and Agree with Plan of Care Patient        Problem List Patient Active Problem List   Diagnosis  Date Noted  . Hyperlipidemia 06/03/2013    Phillips Climes, PTA 04/14/2015, 12:17 PM  [facility] [address] [place], [place], [address] Phone: [phone]   Fax:  [phone]

## 2015-04-14 NOTE — Patient Instructions (Signed)
Levator Scapula Stretch   Place left hand on same side shoulder blade. With other hand, gently stretch head down and away. Hold 5____ seconds. Repeat __5__ times per set. Do __2__ sets per session. Do _2___ sessions per day.  AROM: Neck Rotation   Turn head slowly to look over one shoulder, then the other. Hold each position _10___ seconds. Repeat _5___ times per set. Do __2__ sets per session. Do _2-3___ sessions per day.    AROM: Lateral Neck Flexion   Slowly tilt head toward one shoulder, then the other. Hold each position _10___ seconds. Repeat __5__ times per set. Do __2__ sets per session. Do __2-3__ sessions per day.  http://orth.exer.us/296   Copyright  VHI. All rights reserved.  Stretch Break - Chin Tuck   Looking straight forward, tuck chin and hold __10__ seconds. Relax and return to starting position. Repeat __5-10__ times every _3-4___ hours.  Copyright  VHI. All rights reserved.  Stretch Break - Chest and Shoulder Stretch   Maintaining erect posture, draw shoulders back while bringing elbows back and inward. Return to starting position. Repeat __10-20__ times every _3-4___ hours.

## 2015-04-20 ENCOUNTER — Ambulatory Visit: Payer: Medicare Other | Admitting: Family Medicine

## 2015-04-20 ENCOUNTER — Encounter: Payer: Self-pay | Admitting: *Deleted

## 2015-04-20 ENCOUNTER — Ambulatory Visit: Payer: Medicare Other | Attending: Family Medicine | Admitting: *Deleted

## 2015-04-20 DIAGNOSIS — M542 Cervicalgia: Secondary | ICD-10-CM

## 2015-04-20 NOTE — Therapy (Signed)
[facility] [place], [place], [address] Phone: [phone]   Fax:  [phone]  Physical Therapy Treatment  Patient Details  Name: Kathryn Young MRN: [id number] Date of Birth: 17-Jun-1946 Referring Provider:  Wardell Honour, MD  Encounter Date: 04/20/2015      PT End of Session - 04/20/15 1440    Visit Number 6   Number of Visits 12   Date for PT Re-Evaluation 05/17/15   PT Start Time 2683   PT Stop Time 1436   PT Time Calculation (min) 47 min      Past Medical History  Diagnosis Date  . Hyperlipidemia   . Hypertension     Past Surgical History  Procedure Laterality Date  . Abdominal hysterectomy    . Breast surgery    . Kidney stones      There were no vitals filed for this visit.  Visit Diagnosis:  Neck pain      Subjective Assessment - 04/20/15 1417    Subjective Patient went to Dr. Livia Snellen and he wants patient to continue therapy and he will do blood work and had her stop all medications and pills to rule out possible medications that could be causing burning symptoms and to see him back in three weeks.   Patient Stated Goals Get out of pain like before.   Currently in Pain? Yes   Pain Score 5    Pain Location Neck   Pain Orientation Right   Pain Descriptors / Indicators Burning   Pain Type Chronic pain   Pain Onset More than a month ago   Pain Frequency Constant   Pain Relieving Factors cream                         [facility] Adult PT Treatment/Exercise - 04/20/15 0001    Modalities   Modalities Ultrasound;Electrical Stimulation   Ultrasound   Ultrasound Location RT side neck   Ultrasound Parameters 1.5 w/cm2 x 10 mins to RT Utrap and cervical paras in sitting   Ultrasound Goals Pain   Traction   Type of Traction Cervical   Min (lbs) 5   Max (lbs) 19   Hold Time 99   Rest Time 5   Time 15   Manual Therapy   Manual Therapy Myofascial release   Myofascial Release STW/  IASTM to Right superior border of scapula/ UT/ Levator Scapulae to improve blood flow and oxygen to the nerves in the region                  PT Short Term Goals - 04/14/15 1143    PT SHORT TERM GOAL #1   Title Ind with HEP.   Time 2   Period Weeks   Status Achieved           PT Long Term Goals - 04/07/15 1236    PT LONG TERM GOAL #1   Title Evening pain-level not > 3/10.   Time 6   Period Weeks   Status On-going   PT LONG TERM GOAL #2   Title Eliminate burning sensation.   Status On-going   PT LONG TERM GOAL #3   Title Average daily pain-level not > 3/10.   Status On-going               Plan - 04/20/15 1441    Clinical Impression Statement Pt did fairly well with Rx. She feels that something is helping because  she is doing better overall. Pt did well with [place], STW, and 193s of cervical traction. Goals are ongoing at this time   Pt will benefit from skilled therapeutic intervention in order to improve on the following deficits Pain;Decreased activity tolerance   Rehab Potential Excellent   PT Frequency 3x / week   PT Duration 6 weeks   PT Treatment/Interventions Moist Heat;Ultrasound;Therapeutic exercise;Patient/family education;Manual techniques   PT Next Visit Plan Continue with POC per MD Stacks   Consulted and Agree with Plan of Care Patient        Problem List Patient Active Problem List   Diagnosis Date Noted  . Hyperlipidemia 06/03/2013    Siddiq Kaluzny,CHRIS, PTA 04/20/2015, 3:20 PM  [facility] [address] [place], [place], [address] Phone: [phone]   Fax:  [phone]

## 2015-04-26 ENCOUNTER — Ambulatory Visit: Payer: Medicare Other | Admitting: Physical Therapy

## 2015-04-26 ENCOUNTER — Encounter: Payer: Self-pay | Admitting: Physical Therapy

## 2015-04-26 DIAGNOSIS — M542 Cervicalgia: Secondary | ICD-10-CM | POA: Diagnosis not present

## 2015-04-26 NOTE — Therapy (Signed)
[facility] [place], [place], [address] Phone: [phone]   Fax:  [phone]  Physical Therapy Treatment  Patient Details  Name: Kathryn Young MRN: [id number] Date of Birth: 02-19-1946 Referring Provider:  Wardell Honour, MD  Encounter Date: 04/26/2015      PT End of Session - 04/26/15 1336    Visit Number 7   Number of Visits 12   Date for PT Re-Evaluation 05/17/15   PT Start Time 9371   PT Stop Time 1401   PT Time Calculation (min) 47 min   Activity Tolerance Patient tolerated treatment well   Behavior During Therapy [facility] for tasks assessed/performed      Past Medical History  Diagnosis Date  . Hyperlipidemia   . Hypertension     Past Surgical History  Procedure Laterality Date  . Abdominal hysterectomy    . Breast surgery    . Kidney stones      There were no vitals filed for this visit.  Visit Diagnosis:  Neck pain      Subjective Assessment - 04/26/15 1332    Subjective Patient has reported improvement with all right side neck/sholder symptoms. Other symptoms with nerves have been worse per patient and is going back to MD on thursday.   Patient Stated Goals Get out of pain like before.   Currently in Pain? Yes   Pain Score 3    Pain Location Neck   Pain Orientation Right   Pain Descriptors / Indicators Burning  pulling   Pain Type Chronic pain   Pain Onset More than a month ago   Pain Frequency Intermittent   Aggravating Factors  certain movements with shoulder   Pain Relieving Factors rest and therapy                         [facility] Adult PT Treatment/Exercise - 04/26/15 0001    Electrical Stimulation   Electrical Stimulation Location Right c-spine/ut   Electrical Stimulation Action premod   Electrical Stimulation Parameters 80-'150HZ'    Electrical Stimulation Goals Pain   Ultrasound   Ultrasound Location Right side paraspinal/ut   Ultrasound Parameters  1.5w/cm2/50%/55mz x 175m   Ultrasound Goals Pain   Traction   Type of Traction Cervical   Min (lbs) 5   Max (lbs) 19   Hold Time 99   Rest Time 5   Time 15                  PT Short Term Goals - 04/14/15 1143    PT SHORT TERM GOAL #1   Title Ind with HEP.   Time 2   Period Weeks   Status Achieved           PT Long Term Goals - 04/26/15 1338    PT LONG TERM GOAL #1   Title Evening pain-level not > 3/10.   Time 6   Period Weeks   Status Achieved   PT LONG TERM GOAL #2   Title Eliminate burning sensation.   Time 6   Period Weeks   Status On-going   PT LONG TERM GOAL #3   Title Average daily pain-level not > 3/10.   Time 6   Period Weeks   Status Achieved               Plan - 04/26/15 1340    Clinical Impression Statement Patient continues to progress with reports of improvement in right c-spine /  shoulder area about 90% improvement. Patient is able to sleep and evening pain 0-2/10 at most and has a normal daily pain no more than 2/10. Patient has met  LTG #1 and #3 today #2 ongoing due to continued burning sensation.   Pt will benefit from skilled therapeutic intervention in order to improve on the following deficits Pain;Decreased activity tolerance   Rehab Potential Excellent   PT Frequency 3x / week   PT Duration 6 weeks   PT Treatment/Interventions Moist Heat;Ultrasound;Therapeutic exercise;Patient/family education;Manual techniques   PT Next Visit Plan Continue with POC per MD Stacks    Consulted and Agree with Plan of Care Patient        Problem List Patient Active Problem List   Diagnosis Date Noted  . Hyperlipidemia 06/03/2013    Oswaldo Cueto P, PTA 04/26/2015, 2:10 PM  [facility] [address] [place], [place], [address] Phone: [phone]   Fax:  [phone]

## 2015-04-28 ENCOUNTER — Encounter: Payer: Self-pay | Admitting: Physical Therapy

## 2015-04-28 ENCOUNTER — Ambulatory Visit: Payer: Medicare Other | Admitting: Physical Therapy

## 2015-04-28 DIAGNOSIS — M542 Cervicalgia: Secondary | ICD-10-CM

## 2015-04-28 NOTE — Therapy (Signed)
[name], [place], [address] Phone: [phone]   Fax:  [phone]  Physical Therapy Treatment  Patient Details  Name: Kathryn Young MRN: [id number] Date of Birth: 11-06-1945 Referring Provider:  Wardell Honour, MD  Encounter Date: 04/28/2015      PT End of Session - 04/28/15 1121    Visit Number 8   Number of Visits 12   Date for PT Re-Evaluation 05/17/15   PT Start Time 1117   PT Stop Time 1226   PT Time Calculation (min) 69 min   Activity Tolerance Patient tolerated treatment well   Behavior During Therapy [facility] for tasks assessed/performed      Past Medical History  Diagnosis Date  . Hyperlipidemia   . Hypertension     Past Surgical History  Procedure Laterality Date  . Abdominal hysterectomy    . Breast surgery    . Kidney stones      There were no vitals filed for this visit.  Visit Diagnosis:  Neck pain      Subjective Assessment - 04/28/15 1122    Subjective Patient reports that her shoulder hasn't felt like the skin is pulling or tingling today. Reports that around nostrils, in the medial corners of her eyes, and she has a streak from medial R eyebrow into her hairline as well as both rims of her ears. Reports she is now getting symptoms below her waist. Reports that over the weekend when she doesn't walk that she is stiffer and the symptoms are worse Sunday afternoons.   Patient Stated Goals Get out of pain like before.   Currently in Pain? No/denies            [facility] PT Assessment - 04/28/15 0001    Assessment   Medical Diagnosis Neck pain.   Next MD Visit 04/29/2015                     [facility] Adult PT Treatment/Exercise - 04/28/15 0001    Modalities   Modalities Ultrasound;Education officer, community R cervical musculature/ Astronomer Parameters 80-150 Hz x15 min   Electrical Stimulation Goals Other (comment)  Nerve sensation   Ultrasound   Ultrasound Location R    Ultrasound Parameters 1.5 w/cm2, 100%, 1 mhz   Ultrasound Goals Other (Comment)  Nerve sensation   Traction   Type of Traction Cervical   Min (lbs) 5   Max (lbs) 19   Hold Time 99   Rest Time 5   Time 15   Manual Therapy   Manual Therapy Myofascial release   Myofascial Release STW to R cervical musculature into Upper Trap and scapular retractors to increase bloodflow and oxygen to the regional nerves                  PT Short Term Goals - 04/14/15 1143    PT SHORT TERM GOAL #1   Title Ind with HEP.   Time 2   Period Weeks   Status Achieved           PT Long Term Goals - 04/26/15 1338    PT LONG TERM GOAL #1   Title Evening pain-level not > 3/10.   Time 6   Period Weeks   Status Achieved   PT LONG TERM GOAL #2   Title Eliminate burning sensation.   Time  6   Period Weeks   Status On-going   PT LONG TERM GOAL #3   Title Average daily pain-level not > 3/10.   Time 6   Period Weeks   Status Achieved               Plan - 04/28/15 1212    Clinical Impression Statement Patient continues to improve in R shoulder symptoms improving but continues to have pain in face, scalp, as well as below her waist now. The remaining goal has not been met secondary to continued burning sensation felt. Normal modalties response noted following removal of the modalities. STW completed along the R cervical musculature into the R Upper Trap and scapular retractors to fascilitate bringing bloodflow and oxygen into the region's nerve supply. Experienced feeling the same as prior to therapy following today's treatment.   Pt will benefit from skilled therapeutic intervention in order to improve on the following deficits Pain;Decreased activity tolerance   Rehab Potential Excellent   PT Duration 6 weeks   PT Treatment/Interventions  Moist Heat;Ultrasound;Therapeutic exercise;Patient/family education;Manual techniques   PT Next Visit Plan Continue with POC per MD Stacks    Consulted and Agree with Plan of Care Patient        Problem List Patient Active Problem List   Diagnosis Date Noted  . Hyperlipidemia 06/03/2013   Ahmed Prima, PTA 04/28/2015 12:30 PM  [facility] [address] [place], [place], [address] Phone: [phone]   Fax:  [phone]

## 2015-04-29 ENCOUNTER — Encounter: Payer: Self-pay | Admitting: Family Medicine

## 2015-04-29 ENCOUNTER — Ambulatory Visit (INDEPENDENT_AMBULATORY_CARE_PROVIDER_SITE_OTHER): Payer: Medicare Other | Admitting: Family Medicine

## 2015-04-29 VITALS — BP 167/76 | HR 70 | Temp 97.7°F | Wt 144.6 lb

## 2015-04-29 DIAGNOSIS — R202 Paresthesia of skin: Secondary | ICD-10-CM | POA: Diagnosis not present

## 2015-04-29 MED ORDER — DULOXETINE HCL 30 MG PO CPEP: 30.0000 mg | ORAL_CAPSULE | Freq: Every day | ORAL | Status: DC

## 2015-04-29 MED ORDER — LEVOCETIRIZINE DIHYDROCHLORIDE 5 MG PO TABS: 5.0000 mg | ORAL_TABLET | Freq: Every evening | ORAL | Status: DC

## 2015-04-29 NOTE — Progress Notes (Signed)
Subjective:  Patient ID: Kathryn Young, female    DOB: 1945/11/06  Age: 69 y.o. MRN: [id number]  CC: skin sensitivity   HPI Kathryn Young presents for continued tingling and itching. She notices it primarily behind the ears but she also says that her entire back feels like it's been skinned up. However there is no lesion. Additionally there is pain and discomfort at her waistband for her panties and underneath her bra. This was diminished somewhat temporarily by the previous treatment,prednisone, see previous office visit. However symptoms have returned and have become more widespread now than they were at that time. Also having odd odors noted and the roof of her mouth feels "rough."  History Kathryn Young has a past medical history of Hyperlipidemia and Hypertension.   She has past surgical history that includes Abdominal hysterectomy; Breast surgery; and Kidney Stones.   Her family history includes Cancer in her mother.She reports that she has never smoked. She does not have any smokeless tobacco history on file. She reports that she does not drink alcohol or use illicit drugs.  Outpatient Prescriptions Prior to Visit  Medication Sig Dispense Refill  . aspirin 81 MG tablet Take 81 mg by mouth daily.    . B Complex-C (SUPER B COMPLEX PO) Take 1 tablet by mouth daily.    . Cholecalciferol (VITAMIN D3) 2000 UNITS TABS Take 1 tablet by mouth daily.    . fish oil-omega-3 fatty acids 1000 MG capsule Take 2 g by mouth daily.    . Magnesium 250 MG TABS Take 1 tablet by mouth daily.    . simvastatin (ZOCOR) 20 MG tablet Take 1 tablet (20 mg total) by mouth daily. (Patient not taking: Reported on 04/29/2015) 90 tablet 2   No facility-administered medications prior to visit.    ROS Review of Systems  Constitutional: Negative for fever, chills, diaphoresis, appetite change, fatigue and unexpected weight change.  HENT: Positive for mouth sores (roof of mouth feels rough/.). Negative for congestion,  ear pain, hearing loss, postnasal drip, rhinorrhea, sneezing, sore throat and trouble swallowing.   Eyes: Negative for pain.  Respiratory: Negative for cough, chest tightness and shortness of breath.   Cardiovascular: Negative for chest pain and palpitations.  Gastrointestinal: Negative for nausea, vomiting, abdominal pain, diarrhea and constipation.  Genitourinary: Negative for dysuria, frequency and menstrual problem.  Musculoskeletal: Negative for joint swelling and arthralgias.  Skin: Negative for color change, pallor, rash and wound.  Neurological: Negative for weakness and numbness.       See HPI  Psychiatric/Behavioral: Negative for dysphoric mood and agitation.    Objective:  BP 167/76 mmHg  Pulse 70  Temp(Src) 97.7 F (36.5 C) (Oral)  Wt 144 lb 9.6 oz (65.59 kg)  BP Readings from Last 3 Encounters:  04/29/15 167/76  04/13/15 152/71  02/26/15 150/77    Wt Readings from Last 3 Encounters:  04/29/15 144 lb 9.6 oz (65.59 kg)  04/13/15 145 lb 3.2 oz (65.862 kg)  02/26/15 141 lb (63.957 kg)     Physical Exam  Constitutional: She is oriented to person, place, and time. She appears well-developed and well-nourished. No distress.  HENT:  Head: Normocephalic and atraumatic.  Right Ear: External ear normal.  Left Ear: External ear normal.  Nose: Nose normal.  Mouth/Throat: Oropharynx is clear and moist.  Eyes: Conjunctivae and EOM are normal. Pupils are equal, round, and reactive to light.  Neck: Normal range of motion. Neck supple. No thyromegaly present.  Cardiovascular: Normal rate, regular  rhythm and normal heart sounds.   No murmur heard. Pulmonary/Chest: Effort normal and breath sounds normal. No respiratory distress. She has no wheezes. She has no rales.  Abdominal: Soft. Bowel sounds are normal. She exhibits no distension. There is no tenderness.  Lymphadenopathy:    She has no cervical adenopathy.  Neurological: She is alert and oriented to person, place, and  time. She has normal reflexes.  Skin: Skin is warm and dry. No rash noted. No erythema. No pallor.  Psychiatric: She has a normal mood and affect. Her behavior is normal. Judgment and thought content normal.    No results found for: HGBA1C  Lab Results  Component Value Date   WBC 7.1 04/13/2015   HGB 12.9 02/26/2015   HCT 38.8 04/13/2015   GLUCOSE 93 02/26/2015   CHOL 157 02/26/2015   TRIG 105 02/26/2015   HDL 70 02/26/2015   LDLCALC 66 02/26/2015   ALT 16 02/26/2015   AST 18 02/26/2015   NA 143 02/26/2015   K 4.0 02/26/2015   CL 102 02/26/2015   CREATININE 0.85 02/26/2015   BUN 12 02/26/2015   CO2 26 02/26/2015   TSH 2.280 01/26/2014    Dg Bone Density  12/04/2014   CLINICAL DATA:  Postmenopausal.  EXAM: DUAL X-RAY ABSORPTIOMETRY (DXA) FOR BONE MINERAL DENSITY  FINDINGS: AP LUMBAR SPINE  Bone Mineral Density (BMD):  0.881 g/cm2  Young Adult T-Score:  -1.5  Z-Score:  0.5  LEFT FEMUR NECK  Bone Mineral Density (BMD):  0.651 g/cm2  Young Adult T-Score: -1.8  Z-Score:  -0.1  ASSESSMENT: Patient's diagnostic category is LOW BONE MASS by WHO Criteria.  FRACTURE RISK: Moderate  FRAX: Based on the [place], the 10 year probability of a major osteoporotic fracture is 10%. The 10 year probability of a hip fracture is 1.6%.  COMPARISON: Previous examinations, the most recent dated 09/21/2013. Since that time, there has been a statistically significant 4.0% decrease in bone mineral density of the lumbar spine and a statistically insignificant 2.8% decrease in bone mineral density of the left hip.  Effective therapies are available in the form of bisphosphonates, selective estrogen receptor modulators, biologic agents, and hormone replacement therapy (for women). All patients should ensure an adequate intake of dietary calcium (1200 mg daily) and vitamin D (800 IU daily) unless contraindicated.  All treatment decisions require clinical judgment and consideration of  individual patient factors, including patient preferences, co-morbidities, previous drug use, risk factors not captured in the FRAX model (e.g., frailty, falls, vitamin D deficiency, increased bone turnover, interval significant decline in bone density) and possible under- or over-estimation of fracture risk by FRAX.  The National Osteoporosis Foundation recommends that FDA-approved medical therapies be considered in postmenopausal women and men age 58 or older with a:  1. Hip or vertebral (clinical or morphometric) fracture.  2. T-score of -2.5 or lower at the spine or hip.  3. Ten-year fracture probability by FRAX of 3% or greater for hip fracture or 20% or greater for major osteoporotic fracture.  People with diagnosed cases of osteoporosis or at high risk for fracture should have regular bone mineral density tests. For patients eligible for Medicare, routine testing is allowed once every 2 years. The testing frequency can be increased to one year for patients who have rapidly progressing disease, those who are receiving or discontinuing medical therapy to restore bone mass, or have additional risk factors.  World Health Organization [facility]) Criteria:  Normal: T-scores from +1.0 to -  1.0  Low Bone Mass (Osteopenia): T-scores between -1.0 and -2.5  Osteoporosis: T-scores -2.5 and below  Comparison to Reference Population:  T-score is the key measure used in the diagnosis of osteoporosis and relative risk determination for fracture. It provides a value for bone mass relative to the mean bone mass of a young adult reference population expressed in terms of standard deviation (SD).  Z-score is the age-matched score showing the patient's values compared to a population matched for age, sex, and race. This is also expressed in terms of standard deviation. The patient may have values that compare favorably to the age-matched values and still be at increased risk for fracture.   Electronically Signed   By: Claudie Revering M.D.    On: 12/04/2014 18:01   Mm Screening Breast Tomo Bilateral  12/02/2014   CLINICAL DATA:  Screening.  EXAM: DIGITAL SCREENING BILATERAL MAMMOGRAM WITH 3D TOMO WITH CAD  COMPARISON:  Previous exam(s).  ACR Breast Density Category c: The breast tissue is heterogeneously dense, which may obscure small masses.  FINDINGS: There are no findings suspicious for malignancy. Images were processed with CAD.  IMPRESSION: No mammographic evidence of malignancy. A result letter of this screening mammogram will be mailed directly to the patient.  RECOMMENDATION: Screening mammogram in one year. (Code:SM-B-01Y)  BI-RADS CATEGORY  1: Negative.   Electronically Signed   By: Margarette Canada M.D.   On: 12/02/2014 13:48    Assessment & Plan:   Kathryn Young was seen today for skin sensitivity.  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 -     DULoxetine (CYMBALTA) 30 MG capsule; Take 1 capsule (30 mg total) by mouth daily.   I am having Kathryn Young start on levocetirizine and DULoxetine. I am also having her maintain her fish oil-omega-3 fatty acids, aspirin, Magnesium, B Complex-C (SUPER B COMPLEX PO), Vitamin D3, and simvastatin.  Meds ordered this encounter  Medications  . levocetirizine (XYZAL) 5 MG tablet    Sig: Take 1 tablet (5 mg total) by mouth every evening.    Dispense:  30 tablet    Refill:  2  . DULoxetine (CYMBALTA) 30 MG capsule    Sig: Take 1 capsule (30 mg total) by mouth daily.    Dispense:  30 capsule    Refill:  0    Discontinue ALL meds  and supplements until further notice   Follow-up: Return in about 1 month (around 05/30/2015).  Claretta Fraise, M.D.  HPI

## 2015-05-03 ENCOUNTER — Ambulatory Visit: Payer: Medicare Other | Admitting: Physical Therapy

## 2015-05-03 DIAGNOSIS — M542 Cervicalgia: Secondary | ICD-10-CM | POA: Diagnosis not present

## 2015-05-03 NOTE — Therapy (Signed)
[facility] [place], [place], [address] Phone: [phone]   Fax:  [phone]  Physical Therapy Treatment  Patient Details  Name: Kathryn Young MRN: [id number] Date of Birth: 10/09/1946 Referring Provider:  Wardell Honour, MD  Encounter Date: 05/03/2015      PT End of Session - 05/03/15 1327    Visit Number 9   Number of Visits 12   Date for PT Re-Evaluation 05/17/15   PT Start Time 5035   PT Stop Time 1422   PT Time Calculation (min) 60 min      Past Medical History  Diagnosis Date  . Hyperlipidemia   . Hypertension     Past Surgical History  Procedure Laterality Date  . Abdominal hysterectomy    . Breast surgery    . Kidney stones      There were no vitals filed for this visit.  Visit Diagnosis:  Neck pain      Subjective Assessment - 05/03/15 1339    Subjective Patient reports some relief since being put on her new meds. The shoulder is just a little pulling when she was driving.  No pain in the neck. Bil hip crawling/tickle-like feeling. New meds were started Friday..    Currently in Pain? Yes   Pain Score 3    Pain Location Shoulder   Pain Orientation Right   Pain Descriptors / Indicators Burning   Pain Type Chronic pain                         [facility] Adult PT Treatment/Exercise - 05/03/15 0001    Exercises   Exercises Shoulder   Shoulder Exercises: Standing   Horizontal ABduction Strengthening;Both;20 reps;Theraband   Theraband Level (Shoulder Horizontal ABduction) Level 2 (Red)   Extension Strengthening;Both  30 reps pink xts   Row Strengthening;Both;Theraband  30 reps   Theraband Level (Shoulder Row) Level 2 (Red)   Other Standing Exercises diagonals bil with red tband 2 x 10   Electrical Stimulation   Electrical Stimulation Location R cervical musculature/ Upper Trap   Electrical Stimulation Action premod   Electrical Stimulation Parameters 80-150 hz x 15 min   Electrical Stimulation Goals --  nerve sensation   Traction   Type of Traction Cervical   Min (lbs) 5   Max (lbs) 19   Hold Time 99   Rest Time 5   Time 15                  PT Short Term Goals - 04/14/15 1143    PT SHORT TERM GOAL #1   Title Ind with HEP.   Time 2   Period Weeks   Status Achieved           PT Long Term Goals - 04/26/15 1338    PT LONG TERM GOAL #1   Title Evening pain-level not > 3/10.   Time 6   Period Weeks   Status Achieved   PT LONG TERM GOAL #2   Title Eliminate burning sensation.   Time 6   Period Weeks   Status On-going   PT LONG TERM GOAL #3   Title Average daily pain-level not > 3/10.   Time 6   Period Weeks   Status Achieved               Plan - 05/03/15 1412    Clinical Impression Statement Patient improving significantly with symptoms since starting new  meds on 04/30/15. No tenderness to palpation of left neck or shoulder musculature. Continues to have some scapular burning as well as bil hip sx described as tickling/crawling feeling. She demo's 5/5 shoulder strength on the right with mild pulling with resisted flexion. Rt scapular resisted testing slightly weaker on right vs. left. Pt right hand dominant. She was able to tolerate upper back strengthening today without increased pain.     Rehab Potential Excellent   PT Frequency 3x / week   PT Duration 6 weeks   PT Treatment/Interventions Moist Heat;Ultrasound;Therapeutic exercise;Patient/family education;Manual techniques        Problem List Patient Active Problem List   Diagnosis Date Noted  . Hyperlipidemia 06/03/2013   Madelyn Flavors PT  05/03/2015, 2:20 PM  [facility] [address] [place], [place], [address] Phone: [phone]   Fax:  [phone]

## 2015-05-05 ENCOUNTER — Ambulatory Visit: Payer: Medicare Other | Admitting: Physical Therapy

## 2015-05-05 DIAGNOSIS — M542 Cervicalgia: Secondary | ICD-10-CM

## 2015-05-05 NOTE — Therapy (Signed)
[facility] [place], [place], [address] Phone: [phone]   Fax:  [phone]  Physical Therapy Treatment  Patient Details  Name: Kathryn Young MRN: [id number] Date of Birth: 07/04/1946 Referring Provider:  Wardell Honour, MD  Encounter Date: 05/05/2015      PT End of Session - 05/05/15 1321    Visit Number 10   Number of Visits 12   Date for PT Re-Evaluation 05/17/15   PT Start Time 0354   PT Stop Time 1405   PT Time Calculation (min) 44 min   Activity Tolerance Patient tolerated treatment well   Behavior During Therapy [facility] for tasks assessed/performed      Past Medical History  Diagnosis Date  . Hyperlipidemia   . Hypertension     Past Surgical History  Procedure Laterality Date  . Abdominal hysterectomy    . Breast surgery    . Kidney stones      There were no vitals filed for this visit.  Visit Diagnosis:  Neck pain      Subjective Assessment - 05/05/15 1324    Subjective Patient continues to have relief from meds. Main complaint is pain in right supraspinatus/upper trap muscle belly. Has intermittent pain Still has intermittent burning over right eye to top of head usually when washing her face.and itching of her ears.   Currently in Pain? Yes   Pain Score 3    Pain Location Scapula   Pain Orientation Right   Pain Descriptors / Indicators Dull   Pain Type Chronic pain   Pain Onset More than a month ago            [facility] PT Assessment - 05/05/15 0001    Observation/Other Assessments   Focus on Therapeutic Outcomes (FOTO)  44% limited                     [facility] Adult PT Treatment/Exercise - 05/05/15 0001    Shoulder Exercises: Sidelying   External Rotation Strengthening;Right;20 reps;Weights   External Rotation Weight (lbs) 2   Other Sidelying Exercises empty can 1# x 20    Shoulder Exercises: Standing   Extension Strengthening;Both  30 reps pink xts   Row  Strengthening;Both;Theraband  30 reps with pink xts   Shoulder Exercises: ROM/Strengthening   UBE (Upper Arm Bike) 60 RPM  x 6 min alt every 2 min fwd/bwd.   Traction   Type of Traction Cervical   Min (lbs) 5   Max (lbs) 19   Hold Time 99   Rest Time 5   Time 15                  PT Short Term Goals - 04/14/15 1143    PT SHORT TERM GOAL #1   Title Ind with HEP.   Time 2   Period Weeks   Status Achieved           PT Long Term Goals - 05/05/15 1328    PT LONG TERM GOAL #1   Title Evening pain-level not > 3/10.   Time 6   Period Weeks   Status Achieved   PT LONG TERM GOAL #2   Title --  continues above right eye to top of head   Time 6   Period Weeks   Status On-going   PT LONG TERM GOAL #3   Title Average daily pain-level not > 3/10.   Time 6   Period Weeks  Status Achieved               Plan - 05/08/15 1352    Clinical Impression Statement Patient continues to report improvement in burning sensation. She still has some intermittently above the right eye to top of head mainly with washing face. Her ears still itch. She has c/o of some pain in the right suprapinatus and/or upper trap area and has some pain with strengthening there, but no tenderness to palpation. Patient tolerated increased exercise to right shoulder. Decreased use of modalities to prepare for d/c next week.   PT Next Visit Plan Continue 2 more visits and d/c to HEP.  Modalities only if increased pain.   Consulted and Agree with Plan of Care Patient          G-Codes - May 08, 2015 1359    Functional Assessment Tool Used FOTO.   Functional Limitation Mobility: Walking and moving around   Mobility: Walking and Moving Around Current Status [phone]) At least 40 percent but less than 60 percent impaired, limited or restricted   Mobility: Walking and Moving Around Goal Status [phone]) At least 20 percent but less than 40 percent impaired, limited or restricted      Problem  List Patient Active Problem List   Diagnosis Date Noted  . Hyperlipidemia 06/03/2013    Madelyn Flavors PT May 08, 2015, 2:03 PM  [facility] [address] [place], [place], [address] Phone: [phone]   Fax:  [phone]

## 2015-05-10 ENCOUNTER — Ambulatory Visit: Payer: Medicare Other | Admitting: Physical Therapy

## 2015-05-10 ENCOUNTER — Encounter: Payer: Self-pay | Admitting: Physical Therapy

## 2015-05-10 DIAGNOSIS — M542 Cervicalgia: Secondary | ICD-10-CM

## 2015-05-10 NOTE — Therapy (Signed)
[facility] [place], [place], [address] Phone: [phone]   Fax:  [phone]  Physical Therapy Treatment  Patient Details  Name: Kathryn Young MRN: [id number] Date of Birth: 1946-03-25 Referring Provider:  Wardell Honour, MD  Encounter Date: 05/10/2015      PT End of Session - 05/10/15 1307    Visit Number 11   Number of Visits 12   Date for PT Re-Evaluation 05/17/15   PT Start Time 1301   PT Stop Time 1350   PT Time Calculation (min) 49 min   Activity Tolerance Patient tolerated treatment well   Behavior During Therapy [facility] for tasks assessed/performed      Past Medical History  Diagnosis Date  . Hyperlipidemia   . Hypertension     Past Surgical History  Procedure Laterality Date  . Abdominal hysterectomy    . Breast surgery    . Kidney stones      There were no vitals filed for this visit.  Visit Diagnosis:  Neck pain      Subjective Assessment - 05/10/15 1305    Subjective Patient reports that her neck and shoulder feels good today. Had a circle in the R scapular retractor region approximately the size of a quarter or half dollar that she reports she continues to have burning and a swirling sensation. Continues to have a line from R eyebrow to hairline and sensation in both posterior hips.   Patient Stated Goals Get out of pain like before.   Currently in Pain? Yes   Pain Score 3    Pain Location Scapula   Pain Orientation Right;Mid;Medial   Pain Descriptors / Indicators Burning   Pain Type Chronic pain   Pain Onset More than a month ago   Pain Frequency Intermittent            [facility] PT Assessment - 05/10/15 0001    Assessment   Medical Diagnosis Neck pain.                     [facility] Adult PT Treatment/Exercise - 05/10/15 0001    Shoulder Exercises: Sidelying   External Rotation Strengthening;Right;Weights;Other (comment)  3x10 reps   External Rotation Weight (lbs) 2    Other Sidelying Exercises RUE empty can 1# 3x10 reps   Shoulder Exercises: Standing   Horizontal ABduction Strengthening;Both;Theraband;Other (comment)  3 x10 reps   Theraband Level (Shoulder Horizontal ABduction) Level 2 (Red)   Extension Strengthening;Right;Theraband;Other (comment)  3 x10 reps   Theraband Level (Shoulder Extension) Level 2 (Red)   Row Strengthening;Both;Theraband  3x10 reps   Theraband Level (Shoulder Row) Level 2 (Red)   Other Standing Exercises D2 PNF RUE strengthening red theraband 3x10 reps   Modalities   Modalities Electrical Stimulation;Traction   Electrical Stimulation   Electrical Stimulation Location R Upper Trap region but lower into scapula    Electrical Stimulation Action Pre-Mod   Electrical Stimulation Parameters 80-150 Hz x15 min   Electrical Stimulation Goals Other (comment)  Nerve sensation   Traction   Type of Traction Cervical   Min (lbs) --   Max (lbs) 19   Hold Time --   Rest Time --   Time 15  Static with Tye Savoy Unit                  PT Short Term Goals - 04/14/15 1143    PT SHORT TERM GOAL #1   Title Ind with HEP.   Time  2   Period Weeks   Status Achieved           PT Long Term Goals - 05/05/15 1328    PT LONG TERM GOAL #1   Title Evening pain-level not > 3/10.   Time 6   Period Weeks   Status Achieved   PT LONG TERM GOAL #2   Title --  continues above right eye to top of head   Time 6   Period Weeks   Status On-going   PT LONG TERM GOAL #3   Title Average daily pain-level not > 3/10.   Time 6   Period Weeks   Status Achieved               Plan - 05/10/15 1358    Clinical Impression Statement Patient continues to report improvement in the uncomfortable sensations and burning that she once experienced. No longer has sensation in her scalp or around her ears. Only has quarter to half dollar size spot medial to the R scapula with burning or swirling sensation but reported following treatment  that the scapular area was no longer having sensations and the area from R eyebrow to hairline was an issue either. Normal modalities response noted following removal of the modalties.   Pt will benefit from skilled therapeutic intervention in order to improve on the following deficits Pain;Decreased activity tolerance   Rehab Potential Excellent   PT Frequency 3x / week   PT Duration 6 weeks   PT Treatment/Interventions Moist Heat;Ultrasound;Therapeutic exercise;Patient/family education;Manual techniques   PT Next Visit Plan D/C next session.   Consulted and Agree with Plan of Care Patient        Problem List Patient Active Problem List   Diagnosis Date Noted  . Hyperlipidemia 06/03/2013    Wynelle Fanny, PTA 05/10/2015, 2:02 PM  [facility] [address] [place], [place], [address] Phone: [phone]   Fax:  [phone]

## 2015-05-13 ENCOUNTER — Ambulatory Visit: Payer: Medicare Other | Admitting: *Deleted

## 2015-05-13 ENCOUNTER — Encounter: Payer: Self-pay | Admitting: *Deleted

## 2015-05-13 DIAGNOSIS — M542 Cervicalgia: Secondary | ICD-10-CM | POA: Diagnosis not present

## 2015-05-14 NOTE — Therapy (Addendum)
[facility] [place], [place], [address] Phone: [phone]   Fax:  [phone]  Physical Therapy Treatment  Patient Details  Name: Kathryn Young MRN: [id number] Date of Birth: May 22, 1946 Referring Provider:  Wardell Honour, MD  Encounter Date: 2015/05/25      PT End of Session - 25-May-2015 1328    Visit Number 12   Number of Visits 12   Date for PT Re-Evaluation 05/17/15   PT Start Time 1300      Past Medical History  Diagnosis Date  . Hyperlipidemia   . Hypertension     Past Surgical History  Procedure Laterality Date  . Abdominal hysterectomy    . Breast surgery    . Kidney stones      There were no vitals filed for this visit.  Visit Diagnosis:  Neck pain      Subjective Assessment - 2015-05-25 1304    Subjective Patient reports that her neck and shoulder feels good today. Had a circle in the R scapular retractor region approximately the size of a quarter or half dollar that she reports she continues to have burning and a swirling sensation. Continues to have a line from R eyebrow to hairline and sensation in both posterior hips.   Patient Stated Goals Get out of pain like before.   Currently in Pain? Yes   Pain Score 3    Pain Orientation Right;Mid   Pain Descriptors / Indicators Burning   Pain Type Chronic pain   Pain Onset More than a month ago   Pain Frequency Intermittent   Aggravating Factors  certain movements with shldr   Pain Relieving Factors rest and PT                                 PT Education - 05/25/15 1327    Education provided Yes   Education Details RW4 with red band   Person(s) Educated Patient   Methods Explanation;Demonstration;Handout;Tactile cues;Verbal cues   Comprehension Verbalized understanding;Returned demonstration          PT Short Term Goals - 04/14/15 1143    PT SHORT TERM GOAL #1   Title Ind with HEP.   Time 2   Period Weeks    Status Achieved           PT Long Term Goals - 05-25-2015 1339    PT LONG TERM GOAL #1   Title Evening pain-level not > 3/10.   Time 6   Period Weeks   Status Achieved   PT LONG TERM GOAL #3   Title Average daily pain-level not > 3/10.   Time 6   Period Weeks   Status Achieved               Plan - May 25, 2015 1328    Clinical Impression Statement Pt feels that she is about 75% better overall and has met all goals except LTG for eliminating sensations around RT eye. She has progressed well and is In   Pt will benefit from skilled therapeutic intervention in order to improve on the following deficits Pain;Decreased activity tolerance   Rehab Potential Excellent   PT Duration 6 weeks   PT Treatment/Interventions Moist Heat;Ultrasound;Therapeutic exercise;Patient/family education;Manual techniques   PT Next Visit Plan Dc to HEP   Consulted and Agree with Plan of Care Patient          G-Codes - May 25, 2015 1751  Functional Assessment Tool Used FOTO DC 32% limitation   Functional Limitation Mobility: Walking and moving around   Mobility: Walking and Moving Around Current Status [phone]) At least 20 percent but less than 40 percent impaired, limited or restricted   Mobility: Walking and Moving Around Goal Status [phone]) At least 20 percent but less than 40 percent impaired, limited or restricted   Mobility: Walking and Moving Around Discharge Status [phone]) At least 20 percent but less than 40 percent impaired, limited or restricted      Problem List Patient Active Problem List   Diagnosis Date Noted  . Hyperlipidemia 06/03/2013   PHYSICAL THERAPY DISCHARGE SUMMARY  Visits from Start of Care: 12  Current functional level related to goals / functional outcomes: Please see above.   Remaining deficits: Patient reports 75% improvement overall.   Education / Equipment: HEP. Plan: Patient agrees to discharge.  Patient goals were partially met. Patient is being  discharged due to meeting the stated rehab goals.  ?????      Brigitte Soderberg, [place] MPT 05/14/2015, 12:46 PM   Mali Cedar Roseman MPT  [facility] [address] [place], [place], [address] Phone: [phone]   Fax:  [phone]

## 2015-05-21 ENCOUNTER — Ambulatory Visit (INDEPENDENT_AMBULATORY_CARE_PROVIDER_SITE_OTHER): Payer: Medicare Other | Admitting: Family Medicine

## 2015-05-21 VITALS — Ht 63.0 in | Wt 144.0 lb

## 2015-05-21 DIAGNOSIS — R202 Paresthesia of skin: Secondary | ICD-10-CM

## 2015-05-21 MED ORDER — PREDNISONE 10 MG PO TABS: ORAL_TABLET | ORAL | Status: DC

## 2015-05-21 NOTE — Progress Notes (Signed)
Subjective:  Patient ID: Kathryn Young, female    DOB: 1946/09/02  Age: 69 y.o. MRN: [id number]  CC: parasthesia   HPI [name] presents for reassessment of tingling and itching. She notices it primarily behind the ears. She also says that her entire back sensation has been diminished to just the sacral area. It's just tingling rather than feeling like it's been skinned up. However there is no lesion. Additionally there was pain and discomfort at her waistband for her panties and underneath her bra. This has diminished to the point of having just a small amount left on the sacrum . Also having odd odors noted and the roof of her mouth now gone. Patient in today states that the itching and burning and tingling is much better tingling in fact is gone the pinprick etc. There is still a little bit of burning along the superior margin of the tragus of each year. She lately relates this is a 2-3/10. The hands have a bit of an itching sensation or scratch area itching sensation to them. And her sacral region has small amount of continued with itching. The itching is getting milder from previous previous visit. The tingling is gone completely and the burning is limited to the upper ears. There is still a tender nerve at the orbital rim superiorly that when it's touched by such things as putting on makeup or taking a shower and washing and in the forehead area that will flare and cause a pain that radiates to the parietal scalp on the right side across the forehead. It lasts several minutes and resolves.  Currently she is taking the simvastatin the levocetirizine and duloxetine only. History Leitha has a past medical history of Hyperlipidemia and Hypertension.   She has past surgical history that includes Abdominal hysterectomy; Breast surgery; and Kidney Stones.   Her family history includes Cancer in her mother.She reports that she has never smoked. She does not have any smokeless tobacco history  on file. She reports that she does not drink alcohol or use illicit drugs.  Outpatient Prescriptions Prior to Visit  Medication Sig Dispense Refill  . aspirin 81 MG tablet Take 81 mg by mouth daily.    . DULoxetine (CYMBALTA) 30 MG capsule Take 1 capsule (30 mg total) by mouth daily. 30 capsule 0  . levocetirizine (XYZAL) 5 MG tablet Take 1 tablet (5 mg total) by mouth every evening. 30 tablet 2  . simvastatin (ZOCOR) 20 MG tablet Take 1 tablet (20 mg total) by mouth daily. 90 tablet 2  . B Complex-C (SUPER B COMPLEX PO) Take 1 tablet by mouth daily.    . Cholecalciferol (VITAMIN D3) 2000 UNITS TABS Take 1 tablet by mouth daily.    . fish oil-omega-3 fatty acids 1000 MG capsule Take 2 g by mouth daily.    . Magnesium 250 MG TABS Take 1 tablet by mouth daily.     No facility-administered medications prior to visit.    ROS Review of Systems  Constitutional: Negative for fever, chills, diaphoresis, appetite change, fatigue and unexpected weight change.  HENT: Positive for mouth sores (roof of mouth feels rough/.). Negative for congestion, ear pain, hearing loss, postnasal drip, rhinorrhea, sneezing, sore throat and trouble swallowing.   Eyes: Negative for pain.  Respiratory: Negative for cough, chest tightness and shortness of breath.   Cardiovascular: Negative for chest pain and palpitations.  Gastrointestinal: Negative for nausea, vomiting, abdominal pain, diarrhea and constipation.  Genitourinary: Negative for dysuria, frequency  and menstrual problem.  Musculoskeletal: Negative for joint swelling and arthralgias.  Skin: Negative for color change, pallor, rash and wound.  Neurological: Negative for weakness and numbness.       See HPI  Psychiatric/Behavioral: Negative for dysphoric mood and agitation.    Objective:  Ht 5\' 3"  (1.6 m)  Wt 144 lb (65.318 kg)  BMI 25.51 kg/m2  BP Readings from Last 3 Encounters:  04/29/15 167/76  04/13/15 152/71  02/26/15 150/77    Wt Readings  from Last 3 Encounters:  05/21/15 144 lb (65.318 kg)  04/29/15 144 lb 9.6 oz (65.59 kg)  04/13/15 145 lb 3.2 oz (65.862 kg)     Physical Exam  Constitutional: She is oriented to person, place, and time. She appears well-developed and well-nourished. No distress.  HENT:  Head: Normocephalic and atraumatic.  Right Ear: External ear normal.  Left Ear: External ear normal.  Nose: Nose normal.  Mouth/Throat: Oropharynx is clear and moist.  Eyes: Conjunctivae and EOM are normal. Pupils are equal, round, and reactive to light.  Neck: Normal range of motion. Neck supple. No thyromegaly present.  Cardiovascular: Normal rate, regular rhythm and normal heart sounds.   No murmur heard. Pulmonary/Chest: Effort normal and breath sounds normal. No respiratory distress. She has no wheezes. She has no rales.  Abdominal: Soft. Bowel sounds are normal. She exhibits no distension. There is no tenderness.  Lymphadenopathy:    She has no cervical adenopathy.  Neurological: She is alert and oriented to person, place, and time. She has normal reflexes.  Skin: Skin is warm and dry. No rash noted. No erythema. No pallor.  Psychiatric: She has a normal mood and affect. Her behavior is normal. Judgment and thought content normal.    No results found for: HGBA1C  Lab Results  Component Value Date   WBC 7.1 04/13/2015   HGB 12.9 02/26/2015   HCT 38.8 04/13/2015   GLUCOSE 93 02/26/2015   CHOL 157 02/26/2015   TRIG 105 02/26/2015   HDL 70 02/26/2015   LDLCALC 66 02/26/2015   ALT 16 02/26/2015   AST 18 02/26/2015   NA 143 02/26/2015   K 4.0 02/26/2015   CL 102 02/26/2015   CREATININE 0.85 02/26/2015   BUN 12 02/26/2015   CO2 26 02/26/2015   TSH 2.280 01/26/2014    Dg Bone Density  12/04/2014   CLINICAL DATA:  Postmenopausal.  EXAM: DUAL X-RAY ABSORPTIOMETRY (DXA) FOR BONE MINERAL DENSITY  FINDINGS: AP LUMBAR SPINE  Bone Mineral Density (BMD):  0.881 g/cm2  Young Adult T-Score:  -1.5  Z-Score:  0.5   LEFT FEMUR NECK  Bone Mineral Density (BMD):  0.651 g/cm2  Young Adult T-Score: -1.8  Z-Score:  -0.1  ASSESSMENT: Patient's diagnostic category is LOW BONE MASS by WHO Criteria.  FRACTURE RISK: Moderate  FRAX: Based on the [place], the 10 year probability of a major osteoporotic fracture is 10%. The 10 year probability of a hip fracture is 1.6%.  COMPARISON: Previous examinations, the most recent dated 09/21/2013. Since that time, there has been a statistically significant 4.0% decrease in bone mineral density of the lumbar spine and a statistically insignificant 2.8% decrease in bone mineral density of the left hip.  Effective therapies are available in the form of bisphosphonates, selective estrogen receptor modulators, biologic agents, and hormone replacement therapy (for women). All patients should ensure an adequate intake of dietary calcium (1200 mg daily) and vitamin D (800 IU daily) unless contraindicated.  All  treatment decisions require clinical judgment and consideration of individual patient factors, including patient preferences, co-morbidities, previous drug use, risk factors not captured in the FRAX model (e.g., frailty, falls, vitamin D deficiency, increased bone turnover, interval significant decline in bone density) and possible under- or over-estimation of fracture risk by FRAX.  The National Osteoporosis Foundation recommends that FDA-approved medical therapies be considered in postmenopausal women and men age 100 or older with a:  1. Hip or vertebral (clinical or morphometric) fracture.  2. T-score of -2.5 or lower at the spine or hip.  3. Ten-year fracture probability by FRAX of 3% or greater for hip fracture or 20% or greater for major osteoporotic fracture.  People with diagnosed cases of osteoporosis or at high risk for fracture should have regular bone mineral density tests. For patients eligible for Medicare, routine testing is allowed once every 2 years. The  testing frequency can be increased to one year for patients who have rapidly progressing disease, those who are receiving or discontinuing medical therapy to restore bone mass, or have additional risk factors.  World Pharmacologist [facility]) Criteria:  Normal: T-scores from +1.0 to -1.0  Low Bone Mass (Osteopenia): T-scores between -1.0 and -2.5  Osteoporosis: T-scores -2.5 and below  Comparison to Reference Population:  T-score is the key measure used in the diagnosis of osteoporosis and relative risk determination for fracture. It provides a value for bone mass relative to the mean bone mass of a young adult reference population expressed in terms of standard deviation (SD).  Z-score is the age-matched score showing the patient's values compared to a population matched for age, sex, and race. This is also expressed in terms of standard deviation. The patient may have values that compare favorably to the age-matched values and still be at increased risk for fracture.   Electronically Signed   By: Claudie Revering M.D.   On: 12/04/2014 18:01   Mm Screening Breast Tomo Bilateral  12/02/2014   CLINICAL DATA:  Screening.  EXAM: DIGITAL SCREENING BILATERAL MAMMOGRAM WITH 3D TOMO WITH CAD  COMPARISON:  Previous exam(s).  ACR Breast Density Category c: The breast tissue is heterogeneously dense, which may obscure small masses.  FINDINGS: There are no findings suspicious for malignancy. Images were processed with CAD.  IMPRESSION: No mammographic evidence of malignancy. A result letter of this screening mammogram will be mailed directly to the patient.  RECOMMENDATION: Screening mammogram in one year. (Code:SM-B-01Y)  BI-RADS CATEGORY  1: Negative.   Electronically Signed   By: Margarette Canada M.D.   On: 12/02/2014 13:48    Assessment & Plan:   Aricka was seen today for parasthesia.  Diagnoses and all orders for this visit:  Notalgia paresthetica  Other orders -     predniSONE (DELTASONE) 10 MG tablet; Take 5 daily  for 3 days followed by 4,3,2 and 1 for 3 days each.   I have discontinued Ms. Azimi's fish oil-omega-3 fatty acids, Magnesium, B Complex-C (SUPER B COMPLEX PO), and Vitamin D3. I am also having her start on predniSONE. Additionally, I am having her maintain her aspirin, simvastatin, levocetirizine, and DULoxetine.  Meds ordered this encounter  Medications  . predniSONE (DELTASONE) 10 MG tablet    Sig: Take 5 daily for 3 days followed by 4,3,2 and 1 for 3 days each.    Dispense:  45 tablet    Refill:  0       Follow-up: Return in about 3 months (around 08/21/2015), or if symptoms worsen or  fail to improve.  Claretta Fraise, M.D.  HPI

## 2015-05-24 ENCOUNTER — Other Ambulatory Visit: Payer: Self-pay | Admitting: Family Medicine

## 2015-07-24 DIAGNOSIS — Z23 Encounter for immunization: Secondary | ICD-10-CM | POA: Diagnosis not present

## 2015-07-28 ENCOUNTER — Other Ambulatory Visit: Payer: Self-pay | Admitting: Family Medicine

## 2015-08-19 ENCOUNTER — Other Ambulatory Visit: Payer: Medicare Other

## 2015-08-19 DIAGNOSIS — E785 Hyperlipidemia, unspecified: Secondary | ICD-10-CM

## 2015-08-20 LAB — CMP14+EGFR
A/G RATIO: 2.1 (ref 1.1–2.5)
ALK PHOS: 92 IU/L (ref 39–117)
ALT: 13 IU/L (ref 0–32)
AST: 13 IU/L (ref 0–40)
Albumin: 4.4 g/dL (ref 3.6–4.8)
BILIRUBIN TOTAL: 0.4 mg/dL (ref 0.0–1.2)
BUN/Creatinine Ratio: 17 (ref 11–26)
BUN: 14 mg/dL (ref 8–27)
CHLORIDE: 102 mmol/L (ref 97–106)
CO2: 24 mmol/L (ref 18–29)
Calcium: 9.2 mg/dL (ref 8.7–10.3)
Creatinine, Ser: 0.83 mg/dL (ref 0.57–1.00)
GFR calc non Af Amer: 73 mL/min/{1.73_m2} (ref >59)
GFR, EST AFRICAN AMERICAN: 84 mL/min/{1.73_m2} (ref >59)
Globulin, Total: 2.1 g/dL (ref 1.5–4.5)
Glucose: 104 mg/dL — ABNORMAL HIGH (ref 65–99)
Potassium: 4.1 mmol/L (ref 3.5–5.2)
Sodium: 141 mmol/L (ref 136–144)
TOTAL PROTEIN: 6.5 g/dL (ref 6.0–8.5)

## 2015-08-20 LAB — CBC WITH DIFFERENTIAL/PLATELET
BASOS ABS: 0 10*3/uL (ref 0.0–0.2)
Basos: 0 %
EOS (ABSOLUTE): 0.1 10*3/uL (ref 0.0–0.4)
Eos: 2 %
Hematocrit: 38.5 % (ref 34.0–46.6)
Hemoglobin: 13.3 g/dL (ref 11.1–15.9)
IMMATURE GRANS (ABS): 0 10*3/uL (ref 0.0–0.1)
Immature Granulocytes: 0 %
LYMPHS ABS: 2.2 10*3/uL (ref 0.7–3.1)
LYMPHS: 44 %
MCH: 31.7 pg (ref 26.6–33.0)
MCHC: 34.5 g/dL (ref 31.5–35.7)
MCV: 92 fL (ref 79–97)
MONOS ABS: 0.4 10*3/uL (ref 0.1–0.9)
Monocytes: 8 %
NEUTROS ABS: 2.3 10*3/uL (ref 1.4–7.0)
Neutrophils: 46 %
PLATELETS: 235 10*3/uL (ref 150–379)
RBC: 4.19 x10E6/uL (ref 3.77–5.28)
RDW: 13.8 % (ref 12.3–15.4)
WBC: 5 10*3/uL (ref 3.4–10.8)

## 2015-08-20 LAB — LIPID PANEL
Chol/HDL Ratio: 4.9 ratio units — ABNORMAL HIGH (ref 0.0–4.4)
Cholesterol, Total: 240 mg/dL — ABNORMAL HIGH (ref 100–199)
HDL: 49 mg/dL (ref >39)
LDL Calculated: 152 mg/dL — ABNORMAL HIGH (ref 0–99)
Triglycerides: 195 mg/dL — ABNORMAL HIGH (ref 0–149)
VLDL Cholesterol Cal: 39 mg/dL (ref 5–40)

## 2015-08-22 NOTE — Progress Notes (Signed)
Subjective:  Patient ID: Kathryn Young, female    DOB: 1946-09-25  Age: 69 y.o. MRN: [id number]  CC: skin sensitivity and Hyperlipidemia   HPI KEILANI TERRANCE presents for 3 month follow up of skin sensitivity and hyperlipidemia. Concerned about bone health and colonoscopy. Feeling much better. Wonders if she can taper off of meds. Has been treated in the past for depression, but is using  The med for pain control currently. She notes that the facial pain is far better, but she has a slight twinge at the right medial epicanthus when she bumps it.   She finished the prednisone without difficulty and has had much less itching and burning sensation ever since.  She notes that she has had a DEXA, but hasn't reviewed the report. Would like guidance on bone health, but denies any fracture. Additionally she would like to determine if she is due to have colonoscopy soon.  She discontinued the zocor while going through the evaluation of the pruritis. Although the itch improved it is unclear if the med was causative. Sh had experienced no other side effects. She came in for lipid panel, fasting a few days ago. The results are attached below, reviewed with her.   History Phebe has a past medical history of Hyperlipidemia and Hypertension.   She has past surgical history that includes Abdominal hysterectomy; Breast surgery; and Kidney Stones.   Her family history includes Cancer in her mother.She reports that she has never smoked. She does not have any smokeless tobacco history on file. She reports that she does not drink alcohol or use illicit drugs.  Outpatient Prescriptions Prior to Visit  Medication Sig Dispense Refill  . DULoxetine (CYMBALTA) 30 MG capsule TAKE (1) CAPSULE DAILY 30 capsule 2  . levocetirizine (XYZAL) 5 MG tablet TAKE 1 TABLET IN THE EVENING 30 tablet 3  . aspirin 81 MG tablet Take 81 mg by mouth daily.    . predniSONE (DELTASONE) 10 MG tablet Take 5 daily for 3 days followed by  4,3,2 and 1 for 3 days each. (Patient not taking: Reported on 08/23/2015) 45 tablet 0  . simvastatin (ZOCOR) 20 MG tablet Take 1 tablet (20 mg total) by mouth daily. (Patient not taking: Reported on 08/23/2015) 90 tablet 2   No facility-administered medications prior to visit.    ROS Review of Systems  Constitutional: Negative for fever, chills, diaphoresis, appetite change, fatigue and unexpected weight change.  HENT: Negative for congestion, ear pain, hearing loss, postnasal drip, rhinorrhea, sneezing, sore throat and trouble swallowing.   Eyes: Negative for pain.  Respiratory: Negative for cough, chest tightness and shortness of breath.   Cardiovascular: Negative for chest pain and palpitations.  Gastrointestinal: Negative for nausea, vomiting, abdominal pain, diarrhea and constipation.  Genitourinary: Negative for dysuria, frequency and menstrual problem.  Musculoskeletal: Negative for joint swelling and arthralgias.  Skin: Negative for rash.  Neurological: Negative for dizziness, weakness, numbness and headaches.  Psychiatric/Behavioral: Negative for dysphoric mood and agitation.    Objective:  BP 145/79 mmHg  Pulse 71  Temp(Src) 97 F (36.1 C) (Oral)  Ht 5\' 3"  (1.6 m)  Wt 146 lb (66.225 kg)  BMI 25.87 kg/m2  SpO2 98%  BP Readings from Last 3 Encounters:  08/23/15 145/79  04/29/15 167/76  04/13/15 152/71    Wt Readings from Last 3 Encounters:  08/23/15 146 lb (66.225 kg)  05/21/15 144 lb (65.318 kg)  04/29/15 144 lb 9.6 oz (65.59 kg)     Physical Exam  Constitutional: She is oriented to person, place, and time. She appears well-developed and well-nourished. No distress.  HENT:  Head: Normocephalic and atraumatic.  Right Ear: External ear normal.  Left Ear: External ear normal.  Nose: Nose normal.  Mouth/Throat: Oropharynx is clear and moist.  Eyes: Conjunctivae and EOM are normal. Pupils are equal, round, and reactive to light.  Neck: Normal range of motion.  Neck supple. No thyromegaly present.  Cardiovascular: Normal rate, regular rhythm and normal heart sounds.   No murmur heard. Pulmonary/Chest: Effort normal and breath sounds normal. No respiratory distress. She has no wheezes. She has no rales.  Abdominal: Soft. Bowel sounds are normal. She exhibits no distension. There is no tenderness.  Lymphadenopathy:    She has no cervical adenopathy.  Neurological: She is alert and oriented to person, place, and time. She has normal reflexes.  Skin: Skin is warm and dry.  Psychiatric: She has a normal mood and affect. Her behavior is normal. Judgment and thought content normal.    No results found for: HGBA1C  Lab Results  Component Value Date   WBC 5.0 08/19/2015   HGB 12.9 02/26/2015   HCT 38.5 08/19/2015   GLUCOSE 104* 08/19/2015   CHOL 240* 08/19/2015   TRIG 195* 08/19/2015   HDL 49 08/19/2015   LDLCALC 152* 08/19/2015   ALT 13 08/19/2015   AST 13 08/19/2015   NA 141 08/19/2015   K 4.1 08/19/2015   CL 102 08/19/2015   CREATININE 0.83 08/19/2015   BUN 14 08/19/2015   CO2 24 08/19/2015   TSH 2.280 01/26/2014    Dg Bone Density  12/04/2014  CLINICAL DATA:  Postmenopausal. EXAM: DUAL X-RAY ABSORPTIOMETRY (DXA) FOR BONE MINERAL DENSITY FINDINGS: AP LUMBAR SPINE Bone Mineral Density (BMD):  0.881 g/cm2 Young Adult T-Score:  -1.5 Z-Score:  0.5 LEFT FEMUR NECK Bone Mineral Density (BMD):  0.651 g/cm2 Young Adult T-Score: -1.8 Z-Score:  -0.1 ASSESSMENT: Patient's diagnostic category is LOW BONE MASS by WHO Criteria. FRACTURE RISK: Moderate FRAX: Based on the [place], the 10 year probability of a major osteoporotic fracture is 10%. The 10 year probability of a hip fracture is 1.6%. COMPARISON: Previous examinations, the most recent dated 09/21/2013. Since that time, there has been a statistically significant 4.0% decrease in bone mineral density of the lumbar spine and a statistically insignificant 2.8% decrease in  bone mineral density of the left hip. Effective therapies are available in the form of bisphosphonates, selective estrogen receptor modulators, biologic agents, and hormone replacement therapy (for women). All patients should ensure an adequate intake of dietary calcium (1200 mg daily) and vitamin D (800 IU daily) unless contraindicated. All treatment decisions require clinical judgment and consideration of individual patient factors, including patient preferences, co-morbidities, previous drug use, risk factors not captured in the FRAX model (e.g., frailty, falls, vitamin D deficiency, increased bone turnover, interval significant decline in bone density) and possible under- or over-estimation of fracture risk by FRAX. The National Osteoporosis Foundation recommends that FDA-approved medical therapies be considered in postmenopausal women and men age 62 or older with a: 1. Hip or vertebral (clinical or morphometric) fracture. 2. T-score of -2.5 or lower at the spine or hip. 3. Ten-year fracture probability by FRAX of 3% or greater for hip fracture or 20% or greater for major osteoporotic fracture. People with diagnosed cases of osteoporosis or at high risk for fracture should have regular bone mineral density tests. For patients eligible for Medicare, routine testing is allowed  once every 2 years. The testing frequency can be increased to one year for patients who have rapidly progressing disease, those who are receiving or discontinuing medical therapy to restore bone mass, or have additional risk factors. [facility] [employer] [facility]) Criteria: Normal: T-scores from +1.0 to -1.0 Low Bone Mass (Osteopenia): T-scores between -1.0 and -2.5 Osteoporosis: T-scores -2.5 and below Comparison to Reference Population: T-score is the key measure used in the diagnosis of osteoporosis and relative risk determination for fracture. It provides a value for bone mass relative to the mean bone mass of a young adult reference  population expressed in terms of standard deviation (SD). Z-score is the age-matched score showing the patient's values compared to a population matched for age, sex, and race. This is also expressed in terms of standard deviation. The patient may have values that compare favorably to the age-matched values and still be at increased risk for fracture. Electronically Signed   By: Claudie Revering M.D.   On: 12/04/2014 18:01   Mm Screening Breast Tomo Bilateral  12/02/2014  CLINICAL DATA:  Screening. EXAM: DIGITAL SCREENING BILATERAL MAMMOGRAM WITH 3D TOMO WITH CAD COMPARISON:  Previous exam(s). ACR Breast Density Category c: The breast tissue is heterogeneously dense, which may obscure small masses. FINDINGS: There are no findings suspicious for malignancy. Images were processed with CAD. IMPRESSION: No mammographic evidence of malignancy. A result letter of this screening mammogram will be mailed directly to the patient. RECOMMENDATION: Screening mammogram in one year. (Code:SM-B-01Y) BI-RADS CATEGORY  1: Negative. Electronically Signed   By: Margarette Canada M.D.   On: 12/02/2014 13:48    Assessment & Plan:   Abisai was seen today for skin sensitivity and hyperlipidemia.  Diagnoses and all orders for this visit:  Hyperlipidemia -     HM COLONOSCOPY  Notalgia paresthetica -     HM COLONOSCOPY  Osteopenia -     HM COLONOSCOPY  Vitamin D deficiency -     HM COLONOSCOPY  Screening for colon cancer -     HM COLONOSCOPY  Other orders -     Cholecalciferol (VITAMIN D) 2000 UNITS tablet; Take 1 tablet (2,000 Units total) by mouth daily. -     calcium-vitamin D (OSCAL WITH D) 500-200 MG-UNIT tablet; Take 1 tablet by mouth 2 (two) times daily. -     raloxifene (EVISTA) 60 MG tablet; Take 1 tablet (60 mg total) by mouth daily.   I have discontinued Ms. Edelstein's aspirin, simvastatin, and predniSONE. I am also having her start on Vitamin D, calcium-vitamin D, and raloxifene. Additionally, I am having her  maintain her DULoxetine and levocetirizine.  Meds ordered this encounter  Medications  . Cholecalciferol (VITAMIN D) 2000 UNITS tablet    Sig: Take 1 tablet (2,000 Units total) by mouth daily.    Dispense:  30 tablet    Refill:  11  . calcium-vitamin D (OSCAL WITH D) 500-200 MG-UNIT tablet    Sig: Take 1 tablet by mouth 2 (two) times daily.    Dispense:  100 tablet    Refill:  11  . raloxifene (EVISTA) 60 MG tablet    Sig: Take 1 tablet (60 mg total) by mouth daily.    Dispense:  30 tablet    Refill:  5   Discontinue the cetirizine. Wait one week. If itching does not get worse, restart the simvastatin.  Follow-up: Return in about 6 months (around 02/20/2016) for CPE.  Claretta Fraise, M.D.

## 2015-08-23 ENCOUNTER — Ambulatory Visit (INDEPENDENT_AMBULATORY_CARE_PROVIDER_SITE_OTHER): Payer: Medicare Other | Admitting: Family Medicine

## 2015-08-23 ENCOUNTER — Encounter: Payer: Self-pay | Admitting: Family Medicine

## 2015-08-23 VITALS — BP 145/79 | HR 71 | Temp 97.0°F | Ht 63.0 in | Wt 146.0 lb

## 2015-08-23 DIAGNOSIS — Z1211 Encounter for screening for malignant neoplasm of colon: Secondary | ICD-10-CM | POA: Diagnosis not present

## 2015-08-23 DIAGNOSIS — E785 Hyperlipidemia, unspecified: Secondary | ICD-10-CM

## 2015-08-23 DIAGNOSIS — R202 Paresthesia of skin: Secondary | ICD-10-CM | POA: Diagnosis not present

## 2015-08-23 DIAGNOSIS — E559 Vitamin D deficiency, unspecified: Secondary | ICD-10-CM

## 2015-08-23 DIAGNOSIS — M8580 Other specified disorders of bone density and structure, unspecified site: Secondary | ICD-10-CM

## 2015-08-23 MED ORDER — CALCIUM CARBONATE-VITAMIN D 500-200 MG-UNIT PO TABS: 1.0000 | ORAL_TABLET | Freq: Two times a day (BID) | ORAL | Status: DC

## 2015-08-23 MED ORDER — RALOXIFENE HCL 60 MG PO TABS: 60.0000 mg | ORAL_TABLET | Freq: Every day | ORAL | Status: DC

## 2015-08-23 MED ORDER — VITAMIN D 50 MCG (2000 UT) PO TABS: 2000.0000 [IU] | ORAL_TABLET | Freq: Every day | ORAL | Status: DC

## 2015-08-23 MED ORDER — DULOXETINE HCL 30 MG PO CPEP: ORAL_CAPSULE | ORAL | Status: DC

## 2015-08-23 NOTE — Patient Instructions (Signed)
Discontinue the cetirizine. Wait one week. If itching does not get worse, restart the simvastatin.

## 2015-09-07 ENCOUNTER — Encounter: Payer: Self-pay | Admitting: *Deleted

## 2015-09-14 DIAGNOSIS — D2371 Other benign neoplasm of skin of right lower limb, including hip: Secondary | ICD-10-CM | POA: Diagnosis not present

## 2015-09-14 DIAGNOSIS — M79671 Pain in right foot: Secondary | ICD-10-CM | POA: Diagnosis not present

## 2015-10-12 ENCOUNTER — Other Ambulatory Visit: Payer: Self-pay | Admitting: Family Medicine

## 2015-10-17 HISTORY — PX: BREAST LUMPECTOMY: SHX2

## 2015-10-19 ENCOUNTER — Telehealth: Payer: Self-pay | Admitting: Family Medicine

## 2015-10-19 NOTE — Telephone Encounter (Signed)
Patient is experiencing itching, burning & tingling again, she had seen you for this in the past and wants to know if you think she should begin the Xyzal again.  Patient has medication at home already.

## 2015-10-19 NOTE — Telephone Encounter (Signed)
Patient having itching and burning on the upper arms for about 2 weeks. The severity is slowly increasing. Similar to previous symptoms that responded well to Xyzal. She was advised to resume the Xyzal and use Cetaphil lotion twice daily. Follow-up if symptoms do not begin to remit over the next 5-7 days.

## 2015-12-13 ENCOUNTER — Other Ambulatory Visit: Payer: Self-pay

## 2015-12-13 DIAGNOSIS — Z1231 Encounter for screening mammogram for malignant neoplasm of breast: Secondary | ICD-10-CM

## 2016-01-12 ENCOUNTER — Ambulatory Visit
Admission: RE | Admit: 2016-01-12 | Discharge: 2016-01-12 | Disposition: A | Discharge status: Home / Self Care | Payer: Medicare Other | Source: Ambulatory Visit

## 2016-01-12 DIAGNOSIS — D485 Neoplasm of uncertain behavior of skin: Secondary | ICD-10-CM | POA: Diagnosis not present

## 2016-01-12 DIAGNOSIS — L814 Other melanin hyperpigmentation: Secondary | ICD-10-CM | POA: Diagnosis not present

## 2016-01-12 DIAGNOSIS — L821 Other seborrheic keratosis: Secondary | ICD-10-CM | POA: Diagnosis not present

## 2016-01-12 DIAGNOSIS — B078 Other viral warts: Secondary | ICD-10-CM | POA: Diagnosis not present

## 2016-01-12 DIAGNOSIS — D1801 Hemangioma of skin and subcutaneous tissue: Secondary | ICD-10-CM | POA: Diagnosis not present

## 2016-01-12 DIAGNOSIS — L570 Actinic keratosis: Secondary | ICD-10-CM | POA: Diagnosis not present

## 2016-01-12 DIAGNOSIS — Z1231 Encounter for screening mammogram for malignant neoplasm of breast: Secondary | ICD-10-CM | POA: Diagnosis not present

## 2016-01-12 DIAGNOSIS — L820 Inflamed seborrheic keratosis: Secondary | ICD-10-CM | POA: Diagnosis not present

## 2016-01-12 DIAGNOSIS — D225 Melanocytic nevi of trunk: Secondary | ICD-10-CM | POA: Diagnosis not present

## 2016-01-17 ENCOUNTER — Other Ambulatory Visit: Payer: Self-pay | Admitting: Family Medicine

## 2016-01-17 DIAGNOSIS — R928 Other abnormal and inconclusive findings on diagnostic imaging of breast: Secondary | ICD-10-CM

## 2016-01-24 ENCOUNTER — Other Ambulatory Visit: Payer: Self-pay | Admitting: Family Medicine

## 2016-01-24 ENCOUNTER — Ambulatory Visit
Admission: RE | Admit: 2016-01-24 | Discharge: 2016-01-24 | Disposition: A | Discharge status: Home / Self Care | Payer: Medicare Other | Source: Ambulatory Visit | Attending: Family Medicine | Admitting: Family Medicine

## 2016-01-24 DIAGNOSIS — R928 Other abnormal and inconclusive findings on diagnostic imaging of breast: Secondary | ICD-10-CM

## 2016-01-24 DIAGNOSIS — N6489 Other specified disorders of breast: Secondary | ICD-10-CM | POA: Diagnosis not present

## 2016-01-24 DIAGNOSIS — R922 Inconclusive mammogram: Secondary | ICD-10-CM | POA: Diagnosis not present

## 2016-02-01 ENCOUNTER — Other Ambulatory Visit: Payer: Self-pay | Admitting: Family Medicine

## 2016-02-01 DIAGNOSIS — R928 Other abnormal and inconclusive findings on diagnostic imaging of breast: Secondary | ICD-10-CM

## 2016-02-02 ENCOUNTER — Ambulatory Visit
Admission: RE | Admit: 2016-02-02 | Discharge: 2016-02-02 | Disposition: A | Discharge status: Home / Self Care | Payer: Medicare Other | Source: Ambulatory Visit | Attending: Family Medicine | Admitting: Family Medicine

## 2016-02-02 DIAGNOSIS — N6489 Other specified disorders of breast: Secondary | ICD-10-CM | POA: Diagnosis not present

## 2016-02-02 DIAGNOSIS — R928 Other abnormal and inconclusive findings on diagnostic imaging of breast: Secondary | ICD-10-CM

## 2016-02-02 DIAGNOSIS — D241 Benign neoplasm of right breast: Secondary | ICD-10-CM | POA: Diagnosis not present

## 2016-02-15 ENCOUNTER — Ambulatory Visit: Payer: Self-pay | Admitting: General Surgery

## 2016-02-15 DIAGNOSIS — N6489 Other specified disorders of breast: Secondary | ICD-10-CM

## 2016-02-15 NOTE — H&P (Signed)
Kathryn Young 02/15/2016 2:12 PM Location: [facility] Patient #: [id number] DOB: [birth year] Married / Language: English / Race: White Female  History of Present Illness Kathryn Hollingshead MD; 02/15/2016 3:19 PM) The patient is a 70 year old female.   Note:She is referred by Dr. 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 She underwent her annual mammogram and there is a suspicious lesion noted in the right breast. Image guided biopsy was performed demonstrating the pathology below. Wider excision was recommended and she presents for that. There is a family history of breast cancer and that her mother had breast cancer in her late 64s and died from metastatic breast cancer. [name] has had previous left breast biopsies that were benign.   Breast, right, needle core biopsy, upper outer quadrant - COMPLEX SCLEROSING LESION / RADIAL SCAR SHOWING FIBROCYSTIC CHANGES WITH USUAL DUCTAL HYPERPLASIA AND ASSOCIATED CALCIFICATION. - INTRADUCTAL PAPILLOMA FORMATION PRES  Other Problems Elbert Ewings, CMA; 02/15/2016 2:12 PM) Heart murmur Hypercholesterolemia Kidney Stone Lump In Breast  Past Surgical History Elbert Ewings, CMA; 02/15/2016 2:12 PM) Breast Biopsy Left. multiple Hysterectomy (not due to cancer) - Partial  Diagnostic Studies History Elbert Ewings, CMA; 02/15/2016 2:12 PM) Colonoscopy >10 years ago Mammogram within last year Pap Smear 1-5 years ago  Allergies Elbert Ewings, CMA; 02/15/2016 2:12 PM) No Known Drug Allergies 02/15/2016  Medication History Elbert Ewings, CMA; 02/15/2016 2:13 PM) Levocetirizine Dihydrochloride (5MG  Tablet, Oral) Active. Simvastatin (20MG  Tablet, Oral) Active. Raloxifene HCl (60MG  Tablet, Oral) Active. DULoxetine HCl (30MG  Capsule DR Part, Oral) Active. Calcium (500MG  Tablet, Oral) Active. Cholecalciferol (2000UNIT Capsule, Oral) Active. Medications  Reconciled  Social History Elbert Ewings, [place]; 02/15/2016 2:12 PM) Caffeine use Coffee, Tea. No alcohol use No drug use Tobacco use Never smoker.  Family History Elbert Ewings, [place]; 02/15/2016 2:12 PM) Breast Cancer Mother. Heart Disease Family Members In General. Heart disease in female family member before age 42 Malignant Neoplasm Of Pancreas Family Members In General.  Pregnancy / Birth History Elbert Ewings, [place]; 02/15/2016 2:12 PM) Age at menarche 36 years. Age of menopause <45 Contraceptive History Intrauterine device, Oral contraceptives. Gravida 1 Maternal age 62-20 Para 1 Regular periods     Review of Systems Elbert Ewings CMA; 02/15/2016 2:12 PM) General Present- Fatigue and Weight Gain. Not Present- Appetite Loss, Chills, Fever, Night Sweats and Weight Loss. Skin Present- Dryness. Not Present- Change in Wart/Mole, Hives, Jaundice, New Lesions, Non-Healing Wounds, Rash and Ulcer. HEENT Present- Wears glasses/contact lenses. Not Present- Earache, Hearing Loss, Hoarseness, Nose Bleed, Oral Ulcers, Ringing in the Ears, Seasonal Allergies, Sinus Pain, Sore Throat, Visual Disturbances and Yellow Eyes. Respiratory Not Present- Bloody sputum, Chronic Cough, Difficulty Breathing, Snoring and Wheezing. Breast Present- Breast Mass. Not Present- Breast Pain, Nipple Discharge and Skin Changes. Cardiovascular Present- Shortness of Breath. Not Present- Chest Pain, Difficulty Breathing Lying Down, Leg Cramps, Palpitations, Rapid Heart Rate and Swelling of Extremities. Gastrointestinal Not Present- Abdominal Pain, Bloating, Bloody Stool, Change in Bowel Habits, Chronic diarrhea, Constipation, Difficulty Swallowing, Excessive gas, Gets full quickly at meals, Hemorrhoids, Indigestion, Nausea, Rectal Pain and Vomiting. Female Genitourinary Not Present- Frequency, Nocturia, Painful Urination, Pelvic Pain and Urgency. Musculoskeletal Present- Joint Pain. Not Present- Back Pain, Joint  Stiffness, Muscle Pain, Muscle Weakness and Swelling of Extremities. Neurological Not Present- Decreased Memory, Fainting, Headaches, Numbness, Seizures, Tingling, Tremor, Trouble walking and Weakness. Psychiatric Not Present- Anxiety, Bipolar, Change in Sleep Pattern, Depression, Fearful and Frequent crying. Endocrine Present- Excessive  Hunger. Not Present- Cold Intolerance, Hair Changes, Heat Intolerance, Hot flashes and New Diabetes. Hematology Not Present- Easy Bruising, Excessive bleeding, Gland problems, HIV and Persistent Infections.  Vitals Elbert Ewings CMA; 02/15/2016 2:14 PM) 02/15/2016 2:13 PM Weight: 149 lb Height: 63.5in Body Surface Area: 1.72 m Body Mass Index: 25.98 kg/m  Temp.: 97.15F  Pulse: 70 (Regular)  BP: 126/76 (Sitting, Left Arm, Standard)      Assessment & Plan Kathryn Hollingshead MD; 02/15/2016 3:21 PM)  RADIAL SCAR OF BREAST (N64.89) Impression: She also has an intraductal papilloma in the same specimen. We discussed the probability of their potentially being malignancy around that area and thus the reason for wider excision. She seems to understand this and agrees with that recommendation.  Plan: Right breast lumpectomy after radioactive seed localization. I have explained the procedure, risks, and aftercare to her. Risks include but are not limited to bleeding, infection, wound problems, seroma formation, cosmetic deformity, anesthesia. She seems to [facility] and agrees with the plan.  Jackolyn Confer, MD

## 2016-02-18 ENCOUNTER — Encounter (INDEPENDENT_AMBULATORY_CARE_PROVIDER_SITE_OTHER): Payer: Self-pay

## 2016-02-18 ENCOUNTER — Ambulatory Visit (INDEPENDENT_AMBULATORY_CARE_PROVIDER_SITE_OTHER): Payer: Medicare Other | Admitting: Family Medicine

## 2016-02-18 ENCOUNTER — Encounter: Payer: Self-pay | Admitting: Family Medicine

## 2016-02-18 VITALS — BP 138/82 | HR 70 | Temp 97.2°F | Ht 64.0 in | Wt 149.4 lb

## 2016-02-18 DIAGNOSIS — Z23 Encounter for immunization: Secondary | ICD-10-CM

## 2016-02-18 DIAGNOSIS — R202 Paresthesia of skin: Secondary | ICD-10-CM

## 2016-02-18 DIAGNOSIS — Z0000 Encounter for general adult medical examination without abnormal findings: Secondary | ICD-10-CM

## 2016-02-18 DIAGNOSIS — E559 Vitamin D deficiency, unspecified: Secondary | ICD-10-CM | POA: Diagnosis not present

## 2016-02-18 DIAGNOSIS — Z1211 Encounter for screening for malignant neoplasm of colon: Secondary | ICD-10-CM

## 2016-02-18 DIAGNOSIS — M8580 Other specified disorders of bone density and structure, unspecified site: Secondary | ICD-10-CM | POA: Insufficient documentation

## 2016-02-18 DIAGNOSIS — E785 Hyperlipidemia, unspecified: Secondary | ICD-10-CM | POA: Diagnosis not present

## 2016-02-18 NOTE — Progress Notes (Signed)
Subjective:  Patient ID: Kathryn Young, female    DOB: 11/10/1945  Age: 70 y.o. MRN: [id number]  CC: Annual Exam and Hyperlipidemia   HPI Kathryn Young presents for burning in lower back to knees = bilat. Like standing close to a fire.2/10. Intermittent. Patient in for follow-up of elevated cholesterol. Doing well without complaints on current medication. Denies side effects of statin including myalgia and arthralgia and nausea. Also in today for liver function testing. Currently no chest pain, shortness of breath or other cardiovascular related symptoms noted.  Has a spiculated mass on her breast that has been biopsied. She has seen Dr. Zella Richer and she is awaiting decision from him about the next step with her breast mass.  Kathryn Young is a 70 y.o. female who presents for an Initial Medicare Annual Wellness Visit.  History Lizett has a past medical history of Hyperlipidemia and Hypertension.   She has past surgical history that includes Abdominal hysterectomy; Breast surgery; and Kidney Stones.   Her family history includes Cancer in her mother.She reports that she has never smoked. She does not have any smokeless tobacco history on file. She reports that she does not drink alcohol or use illicit drugs.    ROS Review of Systems  Constitutional: Negative for fever, chills, diaphoresis, appetite change, fatigue and unexpected weight change.  HENT: Negative for congestion, ear pain, hearing loss, postnasal drip, rhinorrhea, sneezing, sore throat and trouble swallowing.   Eyes: Negative for pain.  Respiratory: Negative for cough, chest tightness and shortness of breath.   Cardiovascular: Negative for chest pain and palpitations.  Gastrointestinal: Negative for nausea, vomiting, abdominal pain, diarrhea and constipation.  Endocrine: Negative for cold intolerance, heat intolerance, polydipsia, polyphagia and polyuria.  Genitourinary: Negative for dysuria, frequency and menstrual  problem.  Musculoskeletal: Negative for joint swelling and arthralgias.  Skin: Negative for rash.  Allergic/Immunologic: Negative for environmental allergies.  Neurological: Negative for dizziness, weakness, numbness and headaches.  Psychiatric/Behavioral: Negative for dysphoric mood and agitation.    Objective:  BP 138/82 mmHg  Pulse 70  Temp(Src) 97.2 F (36.2 C) (Oral)  Ht _0  (1.626 m)  Wt 149 lb 6.4 oz (67.767 kg)  BMI 25.63 kg/m2  SpO2 97%  BP Readings from Last 3 Encounters:  02/18/16 138/82  08/23/15 145/79  04/29/15 167/76    Wt Readings from Last 3 Encounters:  02/18/16 149 lb 6.4 oz (67.767 kg)  08/23/15 146 lb (66.225 kg)  05/21/15 144 lb (65.318 kg)       Review of Systems    Current Medications (verified) Outpatient Encounter Prescriptions as of 02/18/2016  Medication Sig  . calcium-vitamin D (OSCAL WITH D) 500-200 MG-UNIT tablet Take 1 tablet by mouth 2 (two) times daily.  . Cholecalciferol (VITAMIN D) 2000 UNITS tablet Take 1 tablet (2,000 Units total) by mouth daily.  . DULoxetine (CYMBALTA) 30 MG capsule TAKE (1) CAPSULE DAILY  . levocetirizine (XYZAL) 5 MG tablet TAKE 1 TABLET IN THE EVENING  . raloxifene (EVISTA) 60 MG tablet Take 1 tablet (60 mg total) by mouth daily.  . simvastatin (ZOCOR) 20 MG tablet TAKE 1 TABLET DAILY   No facility-administered encounter medications on file as of 02/18/2016.    Allergies (verified) Review of patient's allergies indicates no known allergies.   History: Past Medical History  Diagnosis Date  . Hyperlipidemia   . Hypertension    Past Surgical History  Procedure Laterality Date  . Abdominal hysterectomy    . Breast surgery    .  Kidney stones     Family History  Problem Relation Age of Onset  . Cancer Mother     Breast   Social History   Occupational History  . Not on file.   Social History Main Topics  . Smoking status: Never Smoker   . Smokeless tobacco: Not on file  . Alcohol Use: No  .  Drug Use: No  . Sexual Activity: Not on file    Do you feel safe at home?  Yes Are there smokers in your home (other than you)? Yes  Dietary issues and exercise activities: Current Exercise Habits: The patient does not participate in regular exercise at present  Current Dietary habits:  Eating veggies, low fat  Objective:    Today's Vitals   02/18/16 0920  BP: 138/82  Pulse: 70  Temp: 97.2 F (36.2 C)  TempSrc: Oral  Height: _0  (1.626 m)  Weight: 149 lb 6.4 oz (67.767 kg)  SpO2: 97%   Body mass index is 25.63 kg/(m^2).  Activities of Daily Living In your present state of health, do you have any difficulty performing the following activities: 02/18/2016  Hearing? N  Vision? N  Difficulty concentrating or making decisions? N  Walking or climbing stairs? N  Dressing or bathing? N  Doing errands, shopping? N        Depression Screen PHQ 2/9 Scores 02/18/2016 08/23/2015 05/21/2015 04/13/2015  PHQ - 2 Score 0 0 0 0     Fall Risk Fall Risk  02/18/2016 08/23/2015 05/21/2015 04/13/2015 02/26/2015  Falls in the past year? _1     Cognitive Function: No flowsheet data found.  Immunizations and Health Maintenance Immunization History  Administered Date(s) Administered  . Influenza-Unspecified 07/25/2014, 07/25/2015  . Pneumococcal Conjugate-13 02/18/2016  . Pneumococcal Polysaccharide-23 09/16/2011  . Tdap 04/06/2010  . Zoster 11/21/2007   Health Maintenance Due  Topic Date Due  . PAP SMEAR  09/15/2013    Patient Care Team: Claretta Fraise, MD as PCP - General (Family Medicine)      Assessment:    Annual Wellness Visit    Screening Tests Health Maintenance  Topic Date Due  . PAP SMEAR  09/15/2013  . Hepatitis C Screening  02/20/2016 (Originally [birth year])  . INFLUENZA VACCINE  05/16/2016  . MAMMOGRAM  01/11/2018  . TETANUS/TDAP  04/06/2020  . COLONOSCOPY  08/22/2025  . DEXA SCAN  Completed  . ZOSTAVAX  Completed  . PNA vac Low Risk Adult   Completed   *Pap not to be done due to TAH history       Physical Exam  Constitutional: She is oriented to person, place, and time. She appears well-developed and well-nourished. No distress.  HENT:  Head: Normocephalic and atraumatic.  Right Ear: External ear normal.  Left Ear: External ear normal.  Nose: Nose normal.  Mouth/Throat: Oropharynx is clear and moist.  Eyes: Conjunctivae and EOM are normal. Pupils are equal, round, and reactive to light.  Neck: Normal range of motion. Neck supple. No thyromegaly present.  Cardiovascular: Normal rate, regular rhythm and normal heart sounds.   No murmur heard. Pulmonary/Chest: Effort normal and breath sounds normal. No respiratory distress. She has no wheezes. She has no rales. Right breast exhibits no inverted nipple, no mass and no tenderness. Left breast exhibits no inverted nipple, no mass and no tenderness. Breasts are symmetrical.  Abdominal: Soft. Normal appearance and bowel sounds are normal. She exhibits no distension, no abdominal bruit and no mass. There is no  splenomegaly or hepatomegaly. There is no tenderness. There is no tenderness at McBurney's point and negative Murphy's sign.  Musculoskeletal: Normal range of motion. She exhibits no edema or tenderness.  Lymphadenopathy:    She has no cervical adenopathy.  Neurological: She is alert and oriented to person, place, and time. She has normal reflexes.  Skin: Skin is warm and dry. No rash noted.  Psychiatric: She has a normal mood and affect. Her behavior is normal. Judgment and thought content normal.     Lab Results  Component Value Date   WBC 5.0 08/19/2015   HGB 12.9 02/26/2015   HCT 38.5 08/19/2015   PLT 235 08/19/2015   GLUCOSE 104* 08/19/2015   CHOL 240* 08/19/2015   TRIG 195* 08/19/2015   HDL 49 08/19/2015   LDLCALC 152* 08/19/2015   ALT 13 08/19/2015   AST 13 08/19/2015   NA 141 08/19/2015   K 4.1 08/19/2015   CL 102 08/19/2015   CREATININE 0.83  08/19/2015   BUN 14 08/19/2015   CO2 24 08/19/2015   TSH 2.280 01/26/2014    Mm Diag Breast Tomo Uni Right  02/02/2016  CLINICAL DATA:  Status post stereotactic guided core biopsy of right breast distortion. EXAM: 3D DIAGNOSTIC RIGHT MAMMOGRAM POST STEREOTACTIC BIOPSY COMPARISON:  Previous exam(s). FINDINGS: 3D Mammographic images were obtained following stereotactic guided biopsy of distortion in the upper-outer quadrant of the right breast. An X shaped clip is identified in the upper-outer quadrant of the right breast in the area of distortion. IMPRESSION: Tissue marker clip is in expected location after biopsy. Final Assessment: Post Procedure Mammograms for Marker Placement Electronically Signed   By: Nolon Nations M.D.   On: 02/02/2016 09:19   Mm Rt Breast Bx W Loc Dev 1st Lesion Image Bx Spec Stereo Guide  02/04/2016  ADDENDUM REPORT: 02/03/2016 14:47 ADDENDUM: Pathology revealed COMPLEX SCLEROSING LESION / RADIAL SCAR SHOWING FIBROCYSTIC CHANGES WITH USUAL DUCTAL HYPERPLASIA AND ASSOCIATED CALCIFICATION. INTRADUCTAL PAPILLOMA FORMATION PRESENT of the upper outer quadrant of the Right breast, with excision recommended. This was found to be concordant by Dr. Nolon Nations. Pathology results were discussed with the patient by telephone. The patient reported doing well after the biopsy with tenderness at the site. Post biopsy instructions and care were reviewed and questions were answered. The patient was encouraged to call [facility] for any additional concerns. Surgical consultation has been arranged with Dr. Jackolyn Confer at [facility] on Feb 15, 2016. Pathology results reported by Terie Purser, RN on 02/03/2016. Electronically Signed   By: Nolon Nations M.D.   On: 02/03/2016 14:47  02/04/2016  CLINICAL DATA:  Patient presents for stereotactic guided core biopsy distortion in the right breast. EXAM: RIGHT BREAST STEREOTACTIC CORE NEEDLE BIOPSY  COMPARISON:  Previous exams. FINDINGS: The patient and I discussed the procedure of stereotactic-guided biopsy including benefits and alternatives. We discussed the high likelihood of a successful procedure. We discussed the risks of the procedure including infection, bleeding, tissue injury, clip migration, and inadequate sampling. Informed written consent was given. The usual time out protocol was performed immediately prior to the procedure. Using sterile technique and 2% lidocaine as local anesthetic, under stereotactic guidance, a 9 gauge vacuum assisted device was used to perform core needle biopsy of distortion in the upper-outer quadrant of the right breast using a cephalad approach. At the conclusion of the procedure, X shaped tissue marker clip was deployed into the biopsy cavity. Follow-up 2-view mammogram was performed and  dictated separately. IMPRESSION: Stereotactic-guided biopsy of right breast distortion. No apparent complications. Electronically Signed: By: Nolon Nations M.D. On: 02/02/2016 09:18    Assessment & Plan:   Sojourner was seen today for annual exam and hyperlipidemia.  Diagnoses and all orders for this visit:  Medicare annual wellness visit, subsequent -     CBC with Differential/Platelet -     CMP14+EGFR -     Hepatitis c antibody (reflex)  Screen for colon cancer -     Ambulatory referral to Gastroenterology -     CBC with Differential/Platelet -     CMP14+EGFR -     Hepatitis c antibody (reflex)  Vitamin D deficiency -     CBC with Differential/Platelet -     CMP14+EGFR -     Hepatitis c antibody (reflex) -     VITAMIN D 25 Hydroxy (Vit-D Deficiency, Fractures)  Osteopenia -     CBC with Differential/Platelet -     CMP14+EGFR -     Hepatitis c antibody (reflex)  Notalgia paresthetica -     CBC with Differential/Platelet -     CMP14+EGFR -     Hepatitis c antibody (reflex)  Hyperlipidemia -     CBC with Differential/Platelet -     CMP14+EGFR -      Hepatitis c antibody (reflex) -     Lipid panel  Other orders -     Pneumococcal conjugate vaccine 13-valent     Plan:   During the course of the visit Mayci was educated and counseled about the following appropriate screening and preventive services:   Vaccines to include Pneumoccal, Influenza, Td, Zostavax,  Colorectal cancer screening  Cardiovascular disease screening  Diabetes screening  Bone Denisty / Osteoporosis Screening  Mammogram  PAP  Glaucoma screening / Diabetic Eye Exam  Nutrition counseling  Smoking cessation counseling  Advanced Directives  Physical Activity  I am having Ms. Appelt maintain her Vitamin D, calcium-vitamin D, raloxifene, DULoxetine, simvastatin, and levocetirizine.  No orders of the defined types were placed in this encounter.    Goals    None       Patient Instructions (the written plan) were given to the patient.   Demitrious Mccannon, MD   02/18/2016   Follow-up: Return in about 6 months (around 08/20/2016).  Claretta Fraise, M.D.

## 2016-02-19 LAB — LIPID PANEL
CHOLESTEROL TOTAL: 165 mg/dL (ref 100–199)
Chol/HDL Ratio: 3 ratio units (ref 0.0–4.4)
HDL: 55 mg/dL (ref >39)
LDL Calculated: 88 mg/dL (ref 0–99)
Triglycerides: 110 mg/dL (ref 0–149)
VLDL CHOLESTEROL CAL: 22 mg/dL (ref 5–40)

## 2016-02-19 LAB — CBC WITH DIFFERENTIAL/PLATELET
BASOS: 0 %
Basophils Absolute: 0 10*3/uL (ref 0.0–0.2)
EOS (ABSOLUTE): 0.1 10*3/uL (ref 0.0–0.4)
Eos: 2 %
Hematocrit: 40.7 % (ref 34.0–46.6)
Hemoglobin: 13.2 g/dL (ref 11.1–15.9)
IMMATURE GRANS (ABS): 0 10*3/uL (ref 0.0–0.1)
IMMATURE GRANULOCYTES: 0 %
LYMPHS: 40 %
Lymphocytes Absolute: 2.3 10*3/uL (ref 0.7–3.1)
MCH: 30.7 pg (ref 26.6–33.0)
MCHC: 32.4 g/dL (ref 31.5–35.7)
MCV: 95 fL (ref 79–97)
Monocytes Absolute: 0.5 10*3/uL (ref 0.1–0.9)
Monocytes: 8 %
NEUTROS PCT: 50 %
Neutrophils Absolute: 2.9 10*3/uL (ref 1.4–7.0)
Platelets: 251 10*3/uL (ref 150–379)
RBC: 4.3 x10E6/uL (ref 3.77–5.28)
RDW: 13.3 % (ref 12.3–15.4)
WBC: 5.7 10*3/uL (ref 3.4–10.8)

## 2016-02-19 LAB — CMP14+EGFR
A/G RATIO: 1.8 (ref 1.2–2.2)
ALBUMIN: 4.3 g/dL (ref 3.6–4.8)
ALK PHOS: 93 IU/L (ref 39–117)
ALT: 10 IU/L (ref 0–32)
AST: 16 IU/L (ref 0–40)
BUN / CREAT RATIO: 13 (ref 12–28)
BUN: 12 mg/dL (ref 8–27)
Bilirubin Total: 0.4 mg/dL (ref 0.0–1.2)
CO2: 25 mmol/L (ref 18–29)
CREATININE: 0.94 mg/dL (ref 0.57–1.00)
Calcium: 9.2 mg/dL (ref 8.7–10.3)
Chloride: 101 mmol/L (ref 96–106)
GFR calc Af Amer: 72 mL/min/{1.73_m2} (ref >59)
GFR, EST NON AFRICAN AMERICAN: 62 mL/min/{1.73_m2} (ref >59)
GLOBULIN, TOTAL: 2.4 g/dL (ref 1.5–4.5)
Glucose: 100 mg/dL — ABNORMAL HIGH (ref 65–99)
POTASSIUM: 4.1 mmol/L (ref 3.5–5.2)
SODIUM: 141 mmol/L (ref 134–144)
Total Protein: 6.7 g/dL (ref 6.0–8.5)

## 2016-02-19 LAB — VITAMIN D 25 HYDROXY (VIT D DEFICIENCY, FRACTURES): VIT D 25 HYDROXY: 37 ng/mL (ref 30.0–100.0)

## 2016-02-19 LAB — HCV COMMENT:

## 2016-02-19 LAB — HEPATITIS C ANTIBODY (REFLEX): HCV Ab: <0.1 s/co ratio (ref 0.0–0.9)

## 2016-02-21 ENCOUNTER — Encounter: Payer: Medicare Other | Admitting: Family Medicine

## 2016-03-01 ENCOUNTER — Other Ambulatory Visit: Payer: Self-pay | Admitting: General Surgery

## 2016-03-01 DIAGNOSIS — N6489 Other specified disorders of breast: Secondary | ICD-10-CM

## 2016-03-20 ENCOUNTER — Encounter (HOSPITAL_BASED_OUTPATIENT_CLINIC_OR_DEPARTMENT_OTHER): Payer: Self-pay | Admitting: *Deleted

## 2016-03-21 ENCOUNTER — Encounter (HOSPITAL_BASED_OUTPATIENT_CLINIC_OR_DEPARTMENT_OTHER)
Admission: RE | Admit: 2016-03-21 | Discharge: 2016-03-21 | Disposition: A | Discharge status: Home / Self Care | Payer: Medicare Other | Source: Ambulatory Visit | Attending: General Surgery | Admitting: General Surgery

## 2016-03-21 DIAGNOSIS — N63 Unspecified lump in breast: Secondary | ICD-10-CM | POA: Insufficient documentation

## 2016-03-21 DIAGNOSIS — Z01812 Encounter for preprocedural laboratory examination: Secondary | ICD-10-CM | POA: Insufficient documentation

## 2016-03-21 DIAGNOSIS — I10 Essential (primary) hypertension: Secondary | ICD-10-CM | POA: Diagnosis not present

## 2016-03-21 DIAGNOSIS — E785 Hyperlipidemia, unspecified: Secondary | ICD-10-CM | POA: Diagnosis not present

## 2016-03-21 LAB — COMPREHENSIVE METABOLIC PANEL
ALT: 14 U/L (ref 14–54)
AST: 20 U/L (ref 15–41)
Albumin: 3.8 g/dL (ref 3.5–5.0)
Alkaline Phosphatase: 81 U/L (ref 38–126)
Anion gap: 7 (ref 5–15)
BUN: 10 mg/dL (ref 6–20)
CHLORIDE: 106 mmol/L (ref 101–111)
CO2: 28 mmol/L (ref 22–32)
CREATININE: 0.87 mg/dL (ref 0.44–1.00)
Calcium: 9.2 mg/dL (ref 8.9–10.3)
GFR calc Af Amer: >60 mL/min (ref >60)
Glucose, Bld: 100 mg/dL — ABNORMAL HIGH (ref 65–99)
Potassium: 4.2 mmol/L (ref 3.5–5.1)
Sodium: 141 mmol/L (ref 135–145)
Total Bilirubin: 0.7 mg/dL (ref 0.3–1.2)
Total Protein: 6.6 g/dL (ref 6.5–8.1)

## 2016-03-21 LAB — CBC WITH DIFFERENTIAL/PLATELET
BASOS PCT: 0 %
Basophils Absolute: 0 10*3/uL (ref 0.0–0.1)
Eosinophils Absolute: 0.1 10*3/uL (ref 0.0–0.7)
Eosinophils Relative: 2 %
HEMATOCRIT: 39.1 % (ref 36.0–46.0)
Hemoglobin: 12.7 g/dL (ref 12.0–15.0)
LYMPHS ABS: 2.3 10*3/uL (ref 0.7–4.0)
Lymphocytes Relative: 42 %
MCH: 30.8 pg (ref 26.0–34.0)
MCHC: 32.5 g/dL (ref 30.0–36.0)
MCV: 94.7 fL (ref 78.0–100.0)
MONO ABS: 0.6 10*3/uL (ref 0.1–1.0)
MONOS PCT: 10 %
NEUTROS ABS: 2.6 10*3/uL (ref 1.7–7.7)
Neutrophils Relative %: 46 %
Platelets: 221 10*3/uL (ref 150–400)
RBC: 4.13 MIL/uL (ref 3.87–5.11)
RDW: 12.7 % (ref 11.5–15.5)
WBC: 5.6 10*3/uL (ref 4.0–10.5)

## 2016-03-21 NOTE — Progress Notes (Signed)
Pt given 8oz carton on boost breeze instructed to drink by 6 am morning dos.  Not to drink any other liquid after mid night.  If pt has not drank by 6 am do not drink it after 6 am. Used teach back and pt voiced understanding

## 2016-03-23 ENCOUNTER — Ambulatory Visit
Admission: RE | Admit: 2016-03-23 | Discharge: 2016-03-23 | Disposition: A | Discharge status: Home / Self Care | Payer: Medicare Other | Source: Ambulatory Visit | Attending: General Surgery | Admitting: General Surgery

## 2016-03-23 DIAGNOSIS — R928 Other abnormal and inconclusive findings on diagnostic imaging of breast: Secondary | ICD-10-CM | POA: Diagnosis not present

## 2016-03-23 DIAGNOSIS — N6489 Other specified disorders of breast: Secondary | ICD-10-CM

## 2016-03-27 ENCOUNTER — Ambulatory Visit (HOSPITAL_BASED_OUTPATIENT_CLINIC_OR_DEPARTMENT_OTHER): Payer: Medicare Other | Admitting: Anesthesiology

## 2016-03-27 ENCOUNTER — Ambulatory Visit (HOSPITAL_BASED_OUTPATIENT_CLINIC_OR_DEPARTMENT_OTHER)
Admission: RE | Admit: 2016-03-27 | Discharge: 2016-03-27 | Disposition: A | Discharge status: Home / Self Care | Payer: Medicare Other | Source: Ambulatory Visit | Attending: General Surgery | Admitting: General Surgery

## 2016-03-27 ENCOUNTER — Encounter (HOSPITAL_BASED_OUTPATIENT_CLINIC_OR_DEPARTMENT_OTHER): Payer: Self-pay | Admitting: Certified Registered"

## 2016-03-27 ENCOUNTER — Encounter (HOSPITAL_BASED_OUTPATIENT_CLINIC_OR_DEPARTMENT_OTHER)
Admission: RE | Disposition: A | Discharge status: Home / Self Care | Payer: Self-pay | Source: Ambulatory Visit | Attending: General Surgery

## 2016-03-27 ENCOUNTER — Ambulatory Visit
Admission: RE | Admit: 2016-03-27 | Discharge: 2016-03-27 | Disposition: A | Discharge status: Home / Self Care | Payer: Medicare Other | Source: Ambulatory Visit | Attending: General Surgery | Admitting: General Surgery

## 2016-03-27 DIAGNOSIS — I10 Essential (primary) hypertension: Secondary | ICD-10-CM | POA: Insufficient documentation

## 2016-03-27 DIAGNOSIS — N6021 Fibroadenosis of right breast: Secondary | ICD-10-CM | POA: Insufficient documentation

## 2016-03-27 DIAGNOSIS — N6081 Other benign mammary dysplasias of right breast: Secondary | ICD-10-CM | POA: Diagnosis not present

## 2016-03-27 DIAGNOSIS — N6011 Diffuse cystic mastopathy of right breast: Secondary | ICD-10-CM | POA: Diagnosis not present

## 2016-03-27 DIAGNOSIS — N6091 Unspecified benign mammary dysplasia of right breast: Secondary | ICD-10-CM | POA: Diagnosis not present

## 2016-03-27 DIAGNOSIS — Z79899 Other long term (current) drug therapy: Secondary | ICD-10-CM | POA: Insufficient documentation

## 2016-03-27 DIAGNOSIS — Z79810 Long term (current) use of selective estrogen receptor modulators (SERMs): Secondary | ICD-10-CM | POA: Diagnosis not present

## 2016-03-27 DIAGNOSIS — Z803 Family history of malignant neoplasm of breast: Secondary | ICD-10-CM | POA: Diagnosis not present

## 2016-03-27 DIAGNOSIS — N6489 Other specified disorders of breast: Secondary | ICD-10-CM | POA: Diagnosis not present

## 2016-03-27 DIAGNOSIS — E7800 Pure hypercholesterolemia, unspecified: Secondary | ICD-10-CM | POA: Diagnosis not present

## 2016-03-27 DIAGNOSIS — R928 Other abnormal and inconclusive findings on diagnostic imaging of breast: Secondary | ICD-10-CM | POA: Diagnosis not present

## 2016-03-27 DIAGNOSIS — R921 Mammographic calcification found on diagnostic imaging of breast: Secondary | ICD-10-CM | POA: Diagnosis not present

## 2016-03-27 HISTORY — DX: Unspecified lump in the right breast, unspecified quadrant: N63.10

## 2016-03-27 HISTORY — DX: Myoneural disorder, unspecified: G70.9

## 2016-03-27 HISTORY — PX: BREAST LUMPECTOMY WITH RADIOACTIVE SEED LOCALIZATION: SHX6424

## 2016-03-27 SURGERY — BREAST LUMPECTOMY WITH RADIOACTIVE SEED LOCALIZATION
Anesthesia: General | Site: Breast | Laterality: Right

## 2016-03-27 MED ORDER — GLYCOPYRROLATE 0.2 MG/ML IJ SOLN: 0.2000 mg | Freq: Once | INTRAMUSCULAR | Status: DC | PRN

## 2016-03-27 MED ORDER — DEXAMETHASONE SODIUM PHOSPHATE 4 MG/ML IJ SOLN
INTRAMUSCULAR | Status: DC | PRN
  Administered 2016-03-27: 10 mg via INTRAVENOUS

## 2016-03-27 MED ORDER — MIDAZOLAM HCL 2 MG/2ML IJ SOLN: 1.0000 mg | INTRAMUSCULAR | Status: DC | PRN

## 2016-03-27 MED ORDER — HYDROCODONE-ACETAMINOPHEN 5-325 MG PO TABS: 1.0000 | ORAL_TABLET | ORAL | Status: DC | PRN

## 2016-03-27 MED ORDER — FENTANYL CITRATE (PF) 100 MCG/2ML IJ SOLN
INTRAMUSCULAR | Status: AC
  Filled 2016-03-27: qty 2

## 2016-03-27 MED ORDER — EPHEDRINE SULFATE 50 MG/ML IJ SOLN
INTRAMUSCULAR | Status: DC | PRN
  Administered 2016-03-27: 10 mg via INTRAVENOUS

## 2016-03-27 MED ORDER — CEFAZOLIN SODIUM-DEXTROSE 2-4 GM/100ML-% IV SOLN
2.0000 g | INTRAVENOUS | Status: AC
  Administered 2016-03-27: 2 g via INTRAVENOUS

## 2016-03-27 MED ORDER — LACTATED RINGERS IV SOLN
INTRAVENOUS | Status: DC
  Administered 2016-03-27: 10:00:00 via INTRAVENOUS
  Administered 2016-03-27: 10 mL/h via INTRAVENOUS

## 2016-03-27 MED ORDER — LACTATED RINGERS IV SOLN: INTRAVENOUS | Status: DC

## 2016-03-27 MED ORDER — FENTANYL CITRATE (PF) 100 MCG/2ML IJ SOLN: 25.0000 ug | INTRAMUSCULAR | Status: DC | PRN

## 2016-03-27 MED ORDER — FENTANYL CITRATE (PF) 100 MCG/2ML IJ SOLN
50.0000 ug | INTRAMUSCULAR | Status: DC | PRN
  Administered 2016-03-27: 50 ug via INTRAVENOUS

## 2016-03-27 MED ORDER — PROPOFOL 10 MG/ML IV BOLUS
INTRAVENOUS | Status: DC | PRN
  Administered 2016-03-27: 150 mg via INTRAVENOUS

## 2016-03-27 MED ORDER — BUPIVACAINE HCL (PF) 0.5 % IJ SOLN
INTRAMUSCULAR | Status: DC | PRN
  Administered 2016-03-27: 19 mL

## 2016-03-27 MED ORDER — SCOPOLAMINE 1 MG/3DAYS TD PT72: 1.0000 | MEDICATED_PATCH | Freq: Once | TRANSDERMAL | Status: DC | PRN

## 2016-03-27 MED ORDER — BUPIVACAINE HCL (PF) 0.5 % IJ SOLN
INTRAMUSCULAR | Status: AC
  Filled 2016-03-27: qty 30

## 2016-03-27 MED ORDER — LIDOCAINE HCL (CARDIAC) 20 MG/ML IV SOLN
INTRAVENOUS | Status: DC | PRN
  Administered 2016-03-27: 60 mg via INTRAVENOUS

## 2016-03-27 MED ORDER — ONDANSETRON HCL 4 MG/2ML IJ SOLN
INTRAMUSCULAR | Status: DC | PRN
  Administered 2016-03-27: 4 mg via INTRAVENOUS

## 2016-03-27 MED ORDER — CEFAZOLIN SODIUM-DEXTROSE 2-4 GM/100ML-% IV SOLN
INTRAVENOUS | Status: AC
  Filled 2016-03-27: qty 100

## 2016-03-27 SURGICAL SUPPLY — 58 items
ADH SKN CLS APL DERMABOND .7 (GAUZE/BANDAGES/DRESSINGS)
APL SKNCLS STERI-STRIP NONHPOA (GAUZE/BANDAGES/DRESSINGS) ×1
APPLIER CLIP 9.375 MED OPEN (MISCELLANEOUS)
APR CLP MED 9.3 20 MLT OPN (MISCELLANEOUS)
BENZOIN TINCTURE PRP APPL 2/3 (GAUZE/BANDAGES/DRESSINGS) ×2 IMPLANT
BINDER BREAST LRG (GAUZE/BANDAGES/DRESSINGS) ×2 IMPLANT
BINDER BREAST MEDIUM (GAUZE/BANDAGES/DRESSINGS) IMPLANT
BINDER BREAST XLRG (GAUZE/BANDAGES/DRESSINGS) IMPLANT
BINDER BREAST XXLRG (GAUZE/BANDAGES/DRESSINGS) IMPLANT
BLADE SURG 15 STRL LF DISP TIS (BLADE) ×1 IMPLANT
BLADE SURG 15 STRL SS (BLADE) ×3
CANISTER SUC SOCK COL 7IN (MISCELLANEOUS) IMPLANT
CANISTER SUCT 1200ML W/VALVE (MISCELLANEOUS) IMPLANT
CHLORAPREP W/TINT 26ML (MISCELLANEOUS) ×3 IMPLANT
CLIP APPLIE 9.375 MED OPEN (MISCELLANEOUS) IMPLANT
CLIP TI WIDE RED SMALL 6 (CLIP) ×2 IMPLANT
CLOSURE WOUND 1/2 X4 (GAUZE/BANDAGES/DRESSINGS)
COVER BACK TABLE 60X90IN (DRAPES) ×3 IMPLANT
COVER MAYO STAND STRL (DRAPES) ×3 IMPLANT
COVER PROBE W GEL 5X96 (DRAPES) ×3 IMPLANT
DECANTER SPIKE VIAL GLASS SM (MISCELLANEOUS) IMPLANT
DERMABOND ADVANCED (GAUZE/BANDAGES/DRESSINGS)
DERMABOND ADVANCED .7 DNX12 (GAUZE/BANDAGES/DRESSINGS) IMPLANT
DEVICE DUBIN W/COMP PLATE 8390 (MISCELLANEOUS) IMPLANT
DRAPE LAPAROTOMY 100X72 PEDS (DRAPES) ×3 IMPLANT
DRAPE UTILITY XL STRL (DRAPES) ×3 IMPLANT
DRSG TELFA 3X8 NADH (GAUZE/BANDAGES/DRESSINGS) ×3 IMPLANT
ELECT COATED BLADE 2.86 ST (ELECTRODE) ×3 IMPLANT
ELECT REM PT RETURN 9FT ADLT (ELECTROSURGICAL) ×3
ELECTRODE REM PT RTRN 9FT ADLT (ELECTROSURGICAL) ×1 IMPLANT
GAUZE SPONGE 4X4 12PLY STRL (GAUZE/BANDAGES/DRESSINGS) ×3 IMPLANT
GLOVE BIOGEL PI IND STRL 8.5 (GLOVE) ×1 IMPLANT
GLOVE BIOGEL PI INDICATOR 8.5 (GLOVE) ×2
GLOVE ECLIPSE 8.0 STRL XLNG CF (GLOVE) ×3 IMPLANT
GOWN STRL REUS W/ TWL LRG LVL3 (GOWN DISPOSABLE) ×2 IMPLANT
GOWN STRL REUS W/TWL LRG LVL3 (GOWN DISPOSABLE) ×6
KIT MARKER MARGIN INK (KITS) ×3 IMPLANT
NDL HYPO 25X1 1.5 SAFETY (NEEDLE) ×1 IMPLANT
NEEDLE HYPO 25X1 1.5 SAFETY (NEEDLE) ×3 IMPLANT
NS IRRIG 1000ML POUR BTL (IV SOLUTION) ×3 IMPLANT
PACK BASIN DAY SURGERY FS (CUSTOM PROCEDURE TRAY) ×3 IMPLANT
PAD DRESSING TELFA 3X8 NADH (GAUZE/BANDAGES/DRESSINGS) ×1 IMPLANT
PENCIL BUTTON HOLSTER BLD 10FT (ELECTRODE) ×3 IMPLANT
SLEEVE SCD COMPRESS KNEE MED (MISCELLANEOUS) ×3 IMPLANT
SPONGE GAUZE 4X4 12PLY STER LF (GAUZE/BANDAGES/DRESSINGS) ×3 IMPLANT
SPONGE LAP 4X18 X RAY DECT (DISPOSABLE) ×3 IMPLANT
STRIP CLOSURE SKIN 1/2X4 (GAUZE/BANDAGES/DRESSINGS) IMPLANT
SUT MON AB 4-0 PC3 18 (SUTURE) ×3 IMPLANT
SUT SILK 2 0 FS (SUTURE) ×3 IMPLANT
SUT VICRYL 3-0 CR8 SH (SUTURE) ×3 IMPLANT
SYR CONTROL 10ML LL (SYRINGE) ×3 IMPLANT
TAPE CLOTH SURG 4X10 WHT LF (GAUZE/BANDAGES/DRESSINGS) ×2 IMPLANT
TAPE STRIPS DRAPE STRL (GAUZE/BANDAGES/DRESSINGS) ×2 IMPLANT
TOWEL OR 17X24 6PK STRL BLUE (TOWEL DISPOSABLE) ×3 IMPLANT
TOWEL OR NON WOVEN STRL DISP B (DISPOSABLE) ×3 IMPLANT
TUBE CONNECTING 20'X1/4 (TUBING)
TUBE CONNECTING 20X1/4 (TUBING) IMPLANT
YANKAUER SUCT BULB TIP NO VENT (SUCTIONS) IMPLANT

## 2016-03-27 NOTE — H&P (Signed)
Kathryn Young.  08/31/16  H & P    She was referred by Dr. 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 She underwent her annual mammogram and there is a suspicious lesion noted in the right breast. Image guided biopsy was performed demonstrating the pathology below. Wider excision was recommended and she presents for that. There is a family history of breast cancer and that her mother had breast cancer in her late 44s and died from metastatic breast cancer. Mrs. Hoek has had previous left breast biopsies that were benign.   Breast, right, needle core biopsy, upper outer quadrant - COMPLEX SCLEROSING LESION / RADIAL SCAR SHOWING FIBROCYSTIC CHANGES WITH USUAL DUCTAL HYPERPLASIA AND ASSOCIATED CALCIFICATION. - INTRADUCTAL PAPILLOMA FORMATION PRES  Other Problems  Heart murmur Hypercholesterolemia Kidney Stone Lump In Breast  Past Surgical History  Breast Biopsy Left. multiple Hysterectomy (not due to cancer) - Partial  Diagnostic Studies History  Colonoscopy >10 years ago Mammogram within last year Pap Smear 1-5 years ago  Allergies  No Known Drug Allergies   Prior to Admission medications   Medication Sig Start Date End Date Taking? Authorizing Provider  calcium-vitamin D (OSCAL WITH D) 500-200 MG-UNIT tablet Take 1 tablet by mouth 2 (two) times daily. 08/23/15  Yes Claretta Fraise, MD  Cholecalciferol (VITAMIN D) 2000 UNITS tablet Take 1 tablet (2,000 Units total) by mouth daily. 08/23/15  Yes Claretta Fraise, MD  DULoxetine (CYMBALTA) 30 MG capsule TAKE (1) CAPSULE DAILY 08/23/15  Yes Claretta Fraise, MD  levocetirizine (XYZAL) 5 MG tablet TAKE 1 TABLET IN THE EVENING 02/01/16  Yes Claretta Fraise, MD  raloxifene (EVISTA) 60 MG tablet Take 1 tablet (60 mg total) by mouth daily. 08/23/15  Yes Claretta Fraise, MD  simvastatin (ZOCOR) 20 MG tablet TAKE 1 TABLET DAILY 01/17/16  Yes Claretta Fraise, MD     Social  History  Caffeine use Coffee, Tea. No alcohol use No drug use Tobacco use Never smoker.  Family History  Breast Cancer Mother. Heart Disease Family Members In General. Heart disease in female family member before age 24 Malignant Neoplasm Of Pancreas Family Members In General.  Pregnancy / Birth History  Age at menarche 70 years. Age of menopause <45 Contraceptive History Intrauterine device, Oral contraceptives. Gravida 1 Maternal age 66-20 Para 1 Regular periods     Review of Systems  General Present- Fatigue and Weight Gain. Not Present- Appetite Loss, Chills, Fever, Night Sweats and Weight Loss. Skin Present- Dryness. Not Present- Change in Wart/Mole, Hives, Jaundice, New Lesions, Non-Healing Wounds, Rash and Ulcer. HEENT Present- Wears glasses/contact lenses. Not Present- Earache, Hearing Loss, Hoarseness, Nose Bleed, Oral Ulcers, Ringing in the Ears, Seasonal Allergies, Sinus Pain, Sore Throat, Visual Disturbances and Yellow Eyes. Respiratory Not Present- Bloody sputum, Chronic Cough, Difficulty Breathing, Snoring and Wheezing. Breast Present- Breast Mass. Not Present- Breast Pain, Nipple Discharge and Skin Changes. Cardiovascular Present- Shortness of Breath. Not Present- Chest Pain, Difficulty Breathing Lying Down, Leg Cramps, Palpitations, Rapid Heart Rate and Swelling of Extremities. Gastrointestinal Not Present- Abdominal Pain, Bloating, Bloody Stool, Change in Bowel Habits, Chronic diarrhea, Constipation, Difficulty Swallowing, Excessive gas, Gets full quickly at meals, Hemorrhoids, Indigestion, Nausea, Rectal Pain and Vomiting. Female Genitourinary Not Present- Frequency, Nocturia, Painful Urination, Pelvic Pain and Urgency. Musculoskeletal Present- Joint Pain. Not Present- Back Pain, Joint Stiffness, Muscle Pain, Muscle Weakness and Swelling of Extremities. Neurological Not Present- Decreased Memory, Fainting, Headaches, Numbness, Seizures, Tingling,  Tremor, Trouble walking and Weakness.  Psychiatric Not Present- Anxiety, Bipolar, Change in Sleep Pattern, Depression, Fearful and Frequent crying. Endocrine Present- Excessive Hunger. Not Present- Cold Intolerance, Hair Changes, Heat Intolerance, Hot flashes and New Diabetes. Hematology Not Present- Easy Bruising, Excessive bleeding, Gland problems, HIV and Persistent Infections.  PE:  Gen-WDWN in NAd  HEENT-Simms/AT  CV- RRR  Right Breast-Clear bandage on; seed verified with Neoprobe   Assessment & Plan [name] MD; 02/15/2016 3:21 PM)  RADIAL SCAR OF BREAST (N64.89) Impression: She also has an intraductal papilloma in the same specimen. We discussed the probability of their potentially being malignancy around that area and thus the reason for wider excision. She seems to understand this and agrees with that recommendation.  Plan: Right breast lumpectomy after radioactive seed localization. I have explained the procedure, risks, and aftercare to her. Risks include but are not limited to bleeding, infection, wound problems, seroma formation, cosmetic deformity, anesthesia. She seems to understand and agrees with the plan.  Jackolyn Confer, MD

## 2016-03-27 NOTE — Op Note (Signed)
Operative Note  Kathryn Young female 70 y.o. 03/27/2016  PREOPERATIVE DX:  Right breast complex sclerosing lesion  POSTOPERATIVE DX:  Same  PROCEDURE:   Right breast lumpectomy after radioactive seed localization         Surgeon: [name]   Assistants: none  Anesthesia: General LMA anesthesia  Indications:   This is a 70 year old female with a family history of breast cancer who was noted to have a suspicious lesion in the right breast. Image guided biopsy demonstrated a complex sclerosing lesion and intraductal papilloma. Wider excision was recommended and she presents for that. She had successful placement of the radioactive seed 4 days ago.    Procedure Detail:  She was seen in the holding area. Radioactive seed was confirmed using the neoprobe.  My initials were placed on the right breast. She is brought to the operating room placed supine on the operating table and a general anesthetic was given. The right breast was sterilely prepped and draped. A timeout was performed.  Using the neoprobe the area of increased counts at the 11:00 position adjacent to the nipple areolar complex was identified. Local anesthetic consisting of half percent plain Marcaine was injected in the circumareolar area from 8:00 to 2:00 positions. A circumareolar incision was then made from 8:00 to 2:00 position.  Subcutaneous flaps were raised. Using the neoprobe and keeping the area of increased counts in the middle of the specimen, I performed a lumpectomy using electrocautery. Once the specimen was removed, margins were inked. There is no radioactivity in the lumpectomy bed. Specimen mammogram was performed. The marker, radioactive seed, and lesion were in the middle of the specimen and this was verified by the radiologist.  The specimen was sent to pathology.  A small clip was placed  In the middle of the lumpectomy bed.  Local anesthetic was then infiltrated into the subcutaneous tissues of the  lumpectomy site.  Hemostasis was adequate. Subcutaneous tissue was approximated with interrupted 3-0 Vicryl sutures. The skin was closed with a running 4-0 Monocryl subcuticular stitch. Steri-Strips were applied. Sterile dressing was applied. A breast binder was applied.  She tolerated the procedure well without any apparent complications. She was taken to the recovery room in satisfactory condition. Estimated blood loss less than 100 cc's. Radioactive seed was recovered by the pathology department.

## 2016-03-27 NOTE — Interval H&P Note (Signed)
History and Physical Interval Note:  03/27/2016 9:22 AM  [name]  has presented today for surgery, with the diagnosis of Complex sclerosing lesion right lesion   The various methods of treatment have been discussed with the patient and family. After consideration of risks, benefits and other options for treatment, the patient has consented to  Procedure(s): RIGHT BREAST LUMPECTOMY WITH RADIOACTIVE SEED LOCALIZATION (Right) as a surgical intervention .  The patient's history has been reviewed, patient examined, no change in status, stable for surgery.  I have reviewed the patient's chart and labs.  Questions were answered to the patient's satisfaction.     Misha Antonini Lenna Sciara

## 2016-03-27 NOTE — Transfer of Care (Signed)
Immediate Anesthesia Transfer of Care Note  Patient: Kathryn Young  Procedure(s) Performed: Procedure(s) with comments: RIGHT BREAST LUMPECTOMYAFTER RADIOACTIVE SEED LOCALIZATION (Right) - RIGHT BREAST [place] RADIOACTIVE SEED LOCALIZATION  Patient Location: PACU  Anesthesia Type:General  Level of Consciousness: awake, alert , oriented and patient cooperative  Airway & Oxygen Therapy: Patient Spontanous Breathing and Patient connected to face mask oxygen  Post-op Assessment: Report given to RN and Post -op Vital signs reviewed and stable  Post vital signs: Reviewed and stable  Last Vitals:  Filed Vitals:   03/27/16 1023 03/27/16 1024  BP: 138/65   Pulse: 84 81  Temp:    Resp:  20    Last Pain: There were no vitals filed for this visit.       Complications: No apparent anesthesia complications

## 2016-03-27 NOTE — Anesthesia Procedure Notes (Signed)
Procedure Name: LMA Insertion Date/Time: 03/27/2016 9:31 AM Performed by: Bejamin Hackbart D Pre-anesthesia Checklist: Patient identified, Emergency Drugs available, Suction available and Patient being monitored Patient Re-evaluated:Patient Re-evaluated prior to inductionOxygen Delivery Method: Circle system utilized Preoxygenation: Pre-oxygenation with 100% oxygen Intubation Type: IV induction Ventilation: Mask ventilation without difficulty LMA: LMA inserted LMA Size: 4.0 Number of attempts: 1 Airway Equipment and Method: Bite block Placement Confirmation: positive ETCO2 Tube secured with: Tape Dental Injury: Teeth and Oropharynx as per pre-operative assessment

## 2016-03-27 NOTE — Anesthesia Postprocedure Evaluation (Signed)
Anesthesia Post Note  Patient: Kathryn Young  Procedure(s) Performed: Procedure(s) (LRB): RIGHT BREAST LUMPECTOMYAFTER RADIOACTIVE SEED LOCALIZATION (Right)  Patient location during evaluation: PACU Anesthesia Type: General Level of consciousness: awake and alert Pain management: pain level controlled Vital Signs Assessment: post-procedure vital signs reviewed and stable Respiratory status: spontaneous breathing, nonlabored ventilation, respiratory function stable and patient connected to nasal cannula oxygen Cardiovascular status: blood pressure returned to baseline and stable Postop Assessment: no signs of nausea or vomiting Anesthetic complications: no    Last Vitals:  Filed Vitals:   03/27/16 1115 03/27/16 1130  BP: 150/94 156/79  Pulse: 77 81  Temp:  36.7 C  Resp: 18 14    Last Pain:  Filed Vitals:   03/27/16 1137  PainSc: 0-No pain                 Tymeka Privette L

## 2016-03-27 NOTE — Anesthesia Preprocedure Evaluation (Addendum)
Anesthesia Evaluation  Patient identified by MRN, date of birth, ID band Patient awake    Reviewed: Allergy & Precautions, H&P , NPO status , Patient's Chart, lab work & pertinent test results  Airway Mallampati: II  TM Distance: >3 FB Neck ROM: full    Dental no notable dental hx. (+) Dental Advisory Given, Teeth Intact   Pulmonary neg pulmonary ROS,    Pulmonary exam normal breath sounds clear to auscultation       Cardiovascular Exercise Tolerance: Good hypertension, Pt. on medications Normal cardiovascular exam Rhythm:regular Rate:Normal     Neuro/Psych burning down right arm  Neuromuscular disease negative psych ROS   GI/Hepatic negative GI ROS, Neg liver ROS,   Endo/Other  negative endocrine ROS  Renal/GU negative Renal ROS  negative genitourinary   Musculoskeletal   Abdominal   Peds  Hematology negative hematology ROS (+)   Anesthesia Other Findings   Reproductive/Obstetrics negative OB ROS                             Anesthesia Physical Anesthesia Plan  ASA: II  Anesthesia Plan: General   Post-op Pain Management:    Induction: Intravenous  Airway Management Planned: LMA  Additional Equipment:   Intra-op Plan:   Post-operative Plan:   Informed Consent: I have reviewed the patients History and Physical, chart, labs and discussed the procedure including the risks, benefits and alternatives for the proposed anesthesia with the patient or authorized representative who has indicated his/her understanding and acceptance.   Dental Advisory Given  Plan Discussed with: CRNA  Anesthesia Plan Comments:         Anesthesia Quick Evaluation

## 2016-03-27 NOTE — Discharge Instructions (Signed)
[place] Office Phone Number [phone]  BREAST BIOPSY/ PARTIAL MASTECTOMY: POST OP INSTRUCTIONS  Always review your discharge instruction sheet given to you by the facility where your surgery was performed.  IF YOU HAVE DISABILITY OR FAMILY LEAVE FORMS, YOU MUST BRING THEM TO THE OFFICE FOR PROCESSING.  DO NOT GIVE THEM TO YOUR DOCTOR.  1. A prescription for pain medication may be given to you upon discharge.  Take your pain medication as prescribed, if needed.  If narcotic pain medicine is not needed, then you may take acetaminophen (Tylenol) or ibuprofen (Advil) as needed. 2. Take your usually prescribed medications unless otherwise directed 3. If you need a refill on your pain medication, please contact your pharmacy.  They will contact our office to request authorization.  Prescriptions will not be filled after 5pm or on week-ends. 4. You should eat very light the first 24 hours after surgery, such as soup, crackers, pudding, etc.  Resume your normal diet the day after surgery. 5. Most patients will experience some swelling and bruising in the breast.  Ice packs and a good support bra will help.  Swelling and bruising can take several days to resolve.  6. It is common to experience some constipation if taking pain medication after surgery.  Increasing fluid intake and taking a stool softener will usually help or prevent this problem from occurring.  A mild laxative (Milk of Magnesia or Miralax) should be taken according to package directions if there are no bowel movements after 48 hours. 7. Unless discharge instructions indicate otherwise, you may remove your bandages 48 hours after surgery, and you may shower at that time.  You may have steri-strips (small skin tapes) in place directly over the incision.  These strips should be left on the skin until they fall off.  If your surgeon used skin glue on the incision, you may shower in 24 hours.  The glue will flake off over the  next 2-3 weeks.  Any sutures or staples will be removed at the office during your follow-up visit. 8. ACTIVITIES:  You may resume light daily activities (gradually increasing) beginning the next day. Continue light activities until you are pain-free. Wearing a good support bra or sports bra minimizes pain and swelling.  You may have sexual intercourse when it is comfortable. a. You may drive when you no longer are taking prescription pain medication, you can comfortably wear a seatbelt, and you can safely maneuver your car and apply brakes. b. RETURN TO WORK:  ______________________________________________________________________________________ 9. You should see your doctor in the office for a follow-up appointment approximately two weeks after your surgery.    Expect your pathology report 3 business days after your surgery.  You may call to check if you do not hear from [place] after three days (ask for [facility]). 10. OTHER INSTRUCTIONS: _______________________________________________________________________________________________ _____________________________________________________________________________________________________________________________________ _____________________________________________________________________________________________________________________________________ _____________________________________________________________________________________________________________________________________  WHEN TO CALL YOUR DOCTOR: 1. Fever over 101.0 2. Nausea and/or vomiting. 3. Extreme swelling or bruising. 4. Continued bleeding from incision. 5. Increased pain, redness, or drainage from the incision.  The clinic staff is available to answer your questions during regular business hours.  Please dont hesitate to call and ask to speak to one of the nurses for clinical concerns.  If you have a medical emergency, go to the nearest emergency room or call 911.  A surgeon from [facility] is always on call at the hospital.  For further questions, please visit [email or web address]  Instructions  Activity: Get plenty of rest for the remainder of the day. A responsible adult should stay with you for 24 hours following the procedure.  For the next 24 hours, DO NOT: -Drive a car -Paediatric nurse -Drink alcoholic beverages -Take any medication unless instructed by your physician -Make any legal decisions or sign important papers.  Meals: Start with liquid foods such as gelatin or soup. Progress to regular foods as tolerated. Avoid greasy, spicy, heavy foods. If nausea and/or vomiting occur, drink only clear liquids until the nausea and/or vomiting subsides. Call your physician if vomiting continues.  Special Instructions/Symptoms: Your throat may feel dry or sore from the anesthesia or the breathing tube placed in your throat during surgery. If this causes discomfort, gargle with warm salt water. The discomfort should disappear within 24 hours.  If you had a scopolamine patch placed behind your ear for the management of post- operative nausea and/or vomiting:  1. The medication in the patch is effective for 72 hours, after which it should be removed.  Wrap patch in a tissue and discard in the trash. Wash hands thoroughly with soap and water. 2. You may remove the patch earlier than 72 hours if you experience unpleasant side effects which may include dry mouth, dizziness or visual disturbances. 3. Avoid touching the patch. Wash your hands with soap and water after contact with the patch.

## 2016-03-28 ENCOUNTER — Encounter (HOSPITAL_BASED_OUTPATIENT_CLINIC_OR_DEPARTMENT_OTHER): Payer: Self-pay | Admitting: General Surgery

## 2016-05-08 ENCOUNTER — Other Ambulatory Visit: Payer: Self-pay | Admitting: Family Medicine

## 2016-06-01 ENCOUNTER — Encounter (HOSPITAL_BASED_OUTPATIENT_CLINIC_OR_DEPARTMENT_OTHER): Payer: Self-pay

## 2016-06-06 ENCOUNTER — Other Ambulatory Visit: Payer: Self-pay | Admitting: Gastroenterology

## 2016-06-06 DIAGNOSIS — K219 Gastro-esophageal reflux disease without esophagitis: Secondary | ICD-10-CM

## 2016-06-06 DIAGNOSIS — Z1211 Encounter for screening for malignant neoplasm of colon: Secondary | ICD-10-CM | POA: Diagnosis not present

## 2016-06-06 DIAGNOSIS — R1310 Dysphagia, unspecified: Secondary | ICD-10-CM | POA: Diagnosis not present

## 2016-06-09 ENCOUNTER — Ambulatory Visit
Admission: RE | Admit: 2016-06-09 | Discharge: 2016-06-09 | Disposition: A | Discharge status: Home / Self Care | Payer: Medicare Other | Source: Ambulatory Visit | Attending: Gastroenterology | Admitting: Gastroenterology

## 2016-06-09 DIAGNOSIS — K219 Gastro-esophageal reflux disease without esophagitis: Secondary | ICD-10-CM

## 2016-06-09 DIAGNOSIS — R1310 Dysphagia, unspecified: Secondary | ICD-10-CM

## 2016-06-09 DIAGNOSIS — K449 Diaphragmatic hernia without obstruction or gangrene: Secondary | ICD-10-CM | POA: Diagnosis not present

## 2016-06-21 DIAGNOSIS — H43813 Vitreous degeneration, bilateral: Secondary | ICD-10-CM | POA: Diagnosis not present

## 2016-06-21 DIAGNOSIS — H43812 Vitreous degeneration, left eye: Secondary | ICD-10-CM | POA: Diagnosis not present

## 2016-06-21 DIAGNOSIS — H2513 Age-related nuclear cataract, bilateral: Secondary | ICD-10-CM | POA: Diagnosis not present

## 2016-06-21 DIAGNOSIS — H5203 Hypermetropia, bilateral: Secondary | ICD-10-CM | POA: Diagnosis not present

## 2016-07-05 ENCOUNTER — Other Ambulatory Visit: Payer: Self-pay | Admitting: Family Medicine

## 2016-07-07 ENCOUNTER — Encounter: Payer: Self-pay | Admitting: Family Medicine

## 2016-07-07 DIAGNOSIS — Z1211 Encounter for screening for malignant neoplasm of colon: Secondary | ICD-10-CM | POA: Diagnosis not present

## 2016-07-31 ENCOUNTER — Ambulatory Visit (INDEPENDENT_AMBULATORY_CARE_PROVIDER_SITE_OTHER): Payer: Medicare Other

## 2016-07-31 DIAGNOSIS — Z23 Encounter for immunization: Secondary | ICD-10-CM

## 2016-08-21 ENCOUNTER — Ambulatory Visit (INDEPENDENT_AMBULATORY_CARE_PROVIDER_SITE_OTHER): Payer: Medicare Other

## 2016-08-21 ENCOUNTER — Ambulatory Visit: Payer: Medicare Other

## 2016-08-21 ENCOUNTER — Encounter: Payer: Self-pay | Admitting: Family Medicine

## 2016-08-21 ENCOUNTER — Ambulatory Visit (INDEPENDENT_AMBULATORY_CARE_PROVIDER_SITE_OTHER): Payer: Medicare Other | Admitting: Family Medicine

## 2016-08-21 VITALS — BP 126/77 | HR 77 | Temp 97.0°F | Ht 63.0 in | Wt 150.0 lb

## 2016-08-21 DIAGNOSIS — M79604 Pain in right leg: Secondary | ICD-10-CM | POA: Diagnosis not present

## 2016-08-21 DIAGNOSIS — M8589 Other specified disorders of bone density and structure, multiple sites: Secondary | ICD-10-CM | POA: Diagnosis not present

## 2016-08-21 DIAGNOSIS — E782 Mixed hyperlipidemia: Secondary | ICD-10-CM | POA: Diagnosis not present

## 2016-08-21 DIAGNOSIS — M79605 Pain in left leg: Principal | ICD-10-CM

## 2016-08-21 DIAGNOSIS — R202 Paresthesia of skin: Secondary | ICD-10-CM | POA: Diagnosis not present

## 2016-08-21 MED ORDER — PANTOPRAZOLE SODIUM 40 MG PO TBEC: 40.0000 mg | DELAYED_RELEASE_TABLET | Freq: Every day | ORAL | 11 refills | Status: DC

## 2016-08-21 NOTE — Progress Notes (Signed)
Subjective:  Patient ID: Kathryn Young, female    DOB: [birth year]  Age: 70 y.o. MRN: [id number]  CC: Hyperlipidemia (6 mo)   HPI Kathryn Young presents for follow-up of elevated cholesterol.Patient states she is having significant myalgia and arthralgia of both lower extremities. She continues to take her statin regularly. There is concern that the medication may be contributing to current symptoms. Also in today for liver function testing. Currently no chest pain, shortness of breath or other cardiovascular related symptoms noted. History Kathryn Young has a past medical history of Breast mass, right; Hyperlipidemia; Hypertension; and Neuromuscular disorder ([place]).   She has a past surgical history that includes Abdominal hysterectomy; Kidney Stones; Breast surgery (Left); and Breast lumpectomy with radioactive seed localization (Right, 03/27/2016).   Her family history includes Cancer in her mother.She reports that she has never smoked. She has never used smokeless tobacco. She reports that she does not drink alcohol or use drugs.  Current Outpatient Prescriptions on File Prior to Visit  Medication Sig Dispense Refill  . calcium-vitamin D (OSCAL WITH D) 500-200 MG-UNIT tablet Take 1 tablet by mouth 2 (two) times daily. 100 tablet 11  . Cholecalciferol (VITAMIN D) 2000 UNITS tablet Take 1 tablet (2,000 Units total) by mouth daily. 30 tablet 11  . DULoxetine (CYMBALTA) 30 MG capsule TAKE (1) CAPSULE DAILY 90 capsule 3  . levocetirizine (XYZAL) 5 MG tablet TAKE 1 TABLET IN THE EVENING 30 tablet 4   No current facility-administered medications on file prior to visit.     ROS Review of Systems  Constitutional: Negative for activity change, appetite change and fever.  HENT: Negative for congestion, rhinorrhea and sore throat.   Eyes: Negative for visual disturbance.  Respiratory: Negative for cough and shortness of breath.   Cardiovascular: Negative for chest pain and palpitations.    Gastrointestinal: Negative for abdominal pain, diarrhea and nausea.  Genitourinary: Negative for dysuria.  Musculoskeletal: Positive for arthralgias and myalgias.    Objective:  BP 126/77 (BP Location: Right Arm)   Pulse 77   Temp 97 F (36.1 C) (Oral)   Ht _0  (1.6 m)   Wt 150 lb (68 kg)   BMI 26.57 kg/m   BP Readings from Last 3 Encounters:  08/21/16 126/77  03/27/16 (!) 156/79  02/18/16 138/82    Wt Readings from Last 3 Encounters:  08/21/16 150 lb (68 kg)  03/27/16 151 lb 6.4 oz (68.7 kg)  02/18/16 149 lb 6.4 oz (67.8 kg)     Physical Exam  Constitutional: She is oriented to person, place, and time. She appears well-developed and well-nourished. No distress.  HENT:  Head: Normocephalic and atraumatic.  Right Ear: External ear normal.  Left Ear: External ear normal.  Nose: Nose normal.  Mouth/Throat: Oropharynx is clear and moist.  Eyes: Conjunctivae and EOM are normal. Pupils are equal, round, and reactive to light.  Neck: Normal range of motion. Neck supple. No thyromegaly present.  Cardiovascular: Normal rate, regular rhythm and normal heart sounds.   No murmur heard. Pulmonary/Chest: Effort normal and breath sounds normal. No respiratory distress. She has no wheezes. She has no rales.  Abdominal: Soft. Bowel sounds are normal. She exhibits no distension. There is no tenderness.  Lymphadenopathy:    She has no cervical adenopathy.  Neurological: She is alert and oriented to person, place, and time. She has normal reflexes.  Skin: Skin is warm and dry.  Psychiatric: She has a normal mood and affect. Her behavior is normal.  Judgment and thought content normal.    No results found for: HGBA1C  Lab Results  Component Value Date   WBC 5.5 08/21/2016   HGB 12.7 03/21/2016   HCT 39.0 08/21/2016   PLT 233 08/21/2016   GLUCOSE 96 08/21/2016   CHOL 188 08/21/2016   TRIG 143 08/21/2016   HDL 53 08/21/2016   LDLCALC 106 (H) 08/21/2016   ALT 16 08/21/2016    AST 20 08/21/2016   NA 142 08/21/2016   K 4.4 08/21/2016   CL 101 08/21/2016   CREATININE 0.72 08/21/2016   BUN 9 08/21/2016   CO2 25 08/21/2016   TSH 2.280 01/26/2014    Dg Esophagus  Result Date: 06/09/2016 CLINICAL DATA:  Gastroesophageal reflux disease. EXAM: ESOPHOGRAM / BARIUM SWALLOW / BARIUM TABLET STUDY TECHNIQUE: Combined double contrast and single contrast examination performed using effervescent crystals, thick barium liquid, and thin barium liquid. The patient was observed with fluoroscopy swallowing a 13 mm barium sulphate tablet. FLUOROSCOPY TIME:  Fluoroscopy Time:  1 minutes and 54 seconds 28 d GY cm2 COMPARISON:  None. FINDINGS: Patient was administered barium contrast orally during fluoroscopic evaluation. Thoracic esophagus appeared normal without mass, ulceration or other focal wall irregularity. A small hiatal hernia was elicited with Valsalva maneuvers. No associated reflux visualized during today's examination. Contrast moved promptly through the thoracic esophagus and into the stomach without evidence of obstruction or dysmotility. A 13 mm barium tablet passed promptly through the esophagus and into the stomach. IMPRESSION: 1. Small hiatal hernia. No associated gastroesophageal reflux visualized during this examination. 2. Otherwise normal barium swallow fluoroscopic examination. Electronically Signed   By: Franki Cabot M.D.   On: 06/09/2016 11:02    Assessment & Plan:   Kathryn Young was seen today for hyperlipidemia.  Diagnoses and all orders for this visit:  Pain in both lower extremities -     CBC with Differential/Platelet -     CMP14+EGFR -     DG HIP UNILAT W OR W/O PELVIS 2-3 VIEWS LEFT; Future -     DG HIP UNILAT W OR W/O PELVIS 2-3 VIEWS RIGHT; Future -     DG Knee 1-2 Views Left; Future -     DG Knee 1-2 Views Right; Future  Mixed hyperlipidemia -     Lipid panel -     CBC with Differential/Platelet -     CMP14+EGFR -     CK  Notalgia paresthetica -      CBC with Differential/Platelet -     CMP14+EGFR  Osteopenia of multiple sites -     CBC with Differential/Platelet -     CMP14+EGFR  Other orders -     pantoprazole (PROTONIX) 40 MG tablet; Take 1 tablet (40 mg total) by mouth daily. For stomach   I have discontinued Ms. Venters's HYDROcodone-acetaminophen. I am also having her start on pantoprazole. Additionally, I am having her maintain her Vitamin D, calcium-vitamin D, DULoxetine, and levocetirizine.  Meds ordered this encounter  Medications  . pantoprazole (PROTONIX) 40 MG tablet    Sig: Take 1 tablet (40 mg total) by mouth daily. For stomach    Dispense:  30 tablet    Refill:  11   In order to make sure that your aches etc. are not coming from your cholesterol pill U should go through the following procedure:  Discontinue the cholesterol pill, the statin. Every day for the next 2 weeks right down on your pain diary the level of pain that you are  having. The pain should get better through that 2 week time period.  After results first 2 weeks start taking the pill again. Right down to a pain level in the diary as before. During this 2 weeks, the pain may come back.  After this second 2 week trial, again discontinue the medication. Right down the pain levels as before. The pain may go away again.  If the pain goes away while off the medicine and comes back through the trials back on the medicine then we can be pretty sure the medication is the cause of the pain. Otherwise I recommend we investigate other possibilities for your pain.  Follow-up: Return in about 6 months (around 02/18/2017).  Claretta Fraise, M.D.

## 2016-08-21 NOTE — Patient Instructions (Signed)
In order to make sure that your aches etc. are not coming from your cholesterol pill U should go through the following procedure:  Discontinue the cholesterol pill, the statin. Every day for the next 2 weeks right down on your pain diary the level of pain that you are having. The pain should get better through that 2 week time period.  After results first 2 weeks start taking the pill again. Right down to a pain level in the diary as before. During this 2 weeks, the pain may come back.  After this second 2 week trial, again discontinue the medication. Right down the pain levels as before. The pain may go away again.  If the pain goes away while off the medicine and comes back through the trials back on the medicine then we can be pretty sure the medication is the cause of the pain. Otherwise I recommend we investigate other possibilities for your pain.

## 2016-08-22 LAB — CMP14+EGFR
A/G RATIO: 2 (ref 1.2–2.2)
ALBUMIN: 4.4 g/dL (ref 3.6–4.8)
ALT: 16 IU/L (ref 0–32)
AST: 20 IU/L (ref 0–40)
Alkaline Phosphatase: 100 IU/L (ref 39–117)
BILIRUBIN TOTAL: 0.4 mg/dL (ref 0.0–1.2)
BUN / CREAT RATIO: 13 (ref 12–28)
BUN: 9 mg/dL (ref 8–27)
CALCIUM: 9.3 mg/dL (ref 8.7–10.3)
CHLORIDE: 101 mmol/L (ref 96–106)
CO2: 25 mmol/L (ref 18–29)
Creatinine, Ser: 0.72 mg/dL (ref 0.57–1.00)
GFR, EST AFRICAN AMERICAN: 99 mL/min/{1.73_m2} (ref >59)
GFR, EST NON AFRICAN AMERICAN: 86 mL/min/{1.73_m2} (ref >59)
GLUCOSE: 96 mg/dL (ref 65–99)
Globulin, Total: 2.2 g/dL (ref 1.5–4.5)
Potassium: 4.4 mmol/L (ref 3.5–5.2)
Sodium: 142 mmol/L (ref 134–144)
TOTAL PROTEIN: 6.6 g/dL (ref 6.0–8.5)

## 2016-08-22 LAB — CBC WITH DIFFERENTIAL/PLATELET
BASOS: 0 %
Basophils Absolute: 0 10*3/uL (ref 0.0–0.2)
EOS (ABSOLUTE): 0.1 10*3/uL (ref 0.0–0.4)
Eos: 2 %
HEMOGLOBIN: 13.3 g/dL (ref 11.1–15.9)
Hematocrit: 39 % (ref 34.0–46.6)
IMMATURE GRANS (ABS): 0 10*3/uL (ref 0.0–0.1)
IMMATURE GRANULOCYTES: 0 %
LYMPHS: 41 %
Lymphocytes Absolute: 2.3 10*3/uL (ref 0.7–3.1)
MCH: 32 pg (ref 26.6–33.0)
MCHC: 34.1 g/dL (ref 31.5–35.7)
MCV: 94 fL (ref 79–97)
MONOCYTES: 7 %
Monocytes Absolute: 0.4 10*3/uL (ref 0.1–0.9)
NEUTROS ABS: 2.7 10*3/uL (ref 1.4–7.0)
NEUTROS PCT: 50 %
Platelets: 233 10*3/uL (ref 150–379)
RBC: 4.15 x10E6/uL (ref 3.77–5.28)
RDW: 13 % (ref 12.3–15.4)
WBC: 5.5 10*3/uL (ref 3.4–10.8)

## 2016-08-22 LAB — LIPID PANEL
CHOLESTEROL TOTAL: 188 mg/dL (ref 100–199)
Chol/HDL Ratio: 3.5 ratio units (ref 0.0–4.4)
HDL: 53 mg/dL (ref >39)
LDL Calculated: 106 mg/dL — ABNORMAL HIGH (ref 0–99)
TRIGLYCERIDES: 143 mg/dL (ref 0–149)
VLDL Cholesterol Cal: 29 mg/dL (ref 5–40)

## 2016-08-22 LAB — CK: Total CK: 98 U/L (ref 24–173)

## 2016-08-30 ENCOUNTER — Other Ambulatory Visit: Payer: Self-pay | Admitting: Family Medicine

## 2016-09-12 ENCOUNTER — Other Ambulatory Visit: Payer: Self-pay | Admitting: Family Medicine

## 2016-09-15 ENCOUNTER — Ambulatory Visit (INDEPENDENT_AMBULATORY_CARE_PROVIDER_SITE_OTHER): Payer: Medicare Other | Admitting: Family Medicine

## 2016-09-15 VITALS — BP 181/96 | HR 88 | Temp 97.2°F | Ht 63.0 in | Wt 150.0 lb

## 2016-09-15 DIAGNOSIS — M792 Neuralgia and neuritis, unspecified: Secondary | ICD-10-CM

## 2016-09-15 MED ORDER — METHYLPREDNISOLONE ACETATE 80 MG/ML IJ SUSP
80.0000 mg | Freq: Once | INTRAMUSCULAR | Status: AC
  Administered 2016-09-15: 80 mg via INTRAMUSCULAR

## 2016-09-15 MED ORDER — GABAPENTIN 300 MG PO CAPS: 300.0000 mg | ORAL_CAPSULE | Freq: Three times a day (TID) | ORAL | 3 refills | Status: DC

## 2016-09-15 MED ORDER — KETOROLAC TROMETHAMINE 60 MG/2ML IM SOLN
60.0000 mg | Freq: Once | INTRAMUSCULAR | Status: AC
  Administered 2016-09-15: 60 mg via INTRAMUSCULAR

## 2016-09-15 NOTE — Patient Instructions (Signed)
Great to see you!  Gabapentin can make you sleepy, start with 1 pill when you do not mind taking a nap. You can take up to 2 pills three times daily but starting with 1 pill at night and then increasing to 2 pills at night is a good way to start.   Please come back if you develop any other concerning signs or symptoms.   Please medical help if you develop dizziness, severe persistent headache, vision changes, or weakness/numbness/tingling on one side of your body.

## 2016-09-15 NOTE — Progress Notes (Signed)
   HPI  Patient presents today here with severe right facial and head pain.  Patient states that over the last 4-5 days she's had low level symptoms similar to this. She's had an episode previously that responded well to steroids.  She states that 3 days ago she hit her head on a cabinet door which was not severe and did not cause any dizziness.   This morning she woke up with severe right-sided scalp pain is radiating down to her for head and then down to her right cheek. She has not tried medications for this. It hurts to touch her eyebrow right cheek, or her right scalp. She describes the pain as electric and shooting in sensation. It lasts a few moments it is so severe that she is screaming this morning.  PMH: Smoking status noted ROS: Per HPI  Objective: BP (!) 181/96 (BP Location: Left Arm, Patient Position: Sitting, Cuff Size: Normal)   Pulse 88   Temp 97.2 F (36.2 C) (Oral)   Ht 5\' 3"  (1.6 m)   Wt 150 lb (68 kg)   BMI 26.57 kg/m  Gen: NAD, alert, cooperative with exam HEENT: NCAT, EOMI, PERRLA CV: RRR, good S1/S2, no murmur Resp: CTABL, no wheezes, non-labored Ext: No edema, warm Neuro: Alert and oriented, strength 5/5 and sensation intact in bilateral lower and upper extremities Careful but normal gait  Assessment and plan:  # Neuralgia Suspicious for trigeminal neuralgia, this is the most severe but not the first episode. An IM Depo-Medrol and Toradol in clinic Also given gabapentin with an aggressive titration, she will try 300 mg first and if it is effective she has been told she can take it 3 times daily. Cautioned her about sleepiness with gabapentin, she may try 600 mg at once if 300 mg is not effective, no more than 3 times a day.  She has close follow-up with her PCP in a few days, I recommended following up as scheduled  Red flags for intracranial bleeding or stroke reviewed the patient will maintain a low threshold for seeking emergency medical  care.   Meds ordered this encounter  Medications  . gabapentin (NEURONTIN) 300 MG capsule    Sig: Take 1-2 capsules (300-600 mg total) by mouth 3 (three) times daily.    Dispense:  180 capsule    Refill:  [place], MD [facility] 09/15/2016, 9:44 AM

## 2016-09-19 ENCOUNTER — Encounter: Payer: Self-pay | Admitting: Family Medicine

## 2016-09-19 ENCOUNTER — Ambulatory Visit (HOSPITAL_COMMUNITY)
Admission: RE | Admit: 2016-09-19 | Discharge: 2016-09-19 | Disposition: A | Discharge status: Home / Self Care | Payer: Medicare Other | Source: Ambulatory Visit | Attending: Family Medicine | Admitting: Family Medicine

## 2016-09-19 ENCOUNTER — Ambulatory Visit (INDEPENDENT_AMBULATORY_CARE_PROVIDER_SITE_OTHER): Payer: Medicare Other | Admitting: Family Medicine

## 2016-09-19 VITALS — BP 155/89 | HR 65 | Temp 97.3°F | Ht 63.0 in | Wt 147.6 lb

## 2016-09-19 DIAGNOSIS — R51 Headache: Secondary | ICD-10-CM | POA: Insufficient documentation

## 2016-09-19 DIAGNOSIS — R9082 White matter disease, unspecified: Secondary | ICD-10-CM | POA: Insufficient documentation

## 2016-09-19 DIAGNOSIS — Z8673 Personal history of transient ischemic attack (TIA), and cerebral infarction without residual deficits: Secondary | ICD-10-CM | POA: Diagnosis not present

## 2016-09-19 DIAGNOSIS — R519 Headache, unspecified: Secondary | ICD-10-CM

## 2016-09-19 MED ORDER — KETOROLAC TROMETHAMINE 60 MG/2ML IM SOLN
60.0000 mg | Freq: Once | INTRAMUSCULAR | Status: AC
  Administered 2016-09-19: 60 mg via INTRAMUSCULAR

## 2016-09-19 MED ORDER — BETAMETHASONE SOD PHOS & ACET 6 (3-3) MG/ML IJ SUSP
6.0000 mg | Freq: Once | INTRAMUSCULAR | Status: AC
  Administered 2016-09-19: 6 mg via INTRAMUSCULAR

## 2016-09-19 MED ORDER — GADOBENATE DIMEGLUMINE 529 MG/ML IV SOLN
13.0000 mL | Freq: Once | INTRAVENOUS | Status: AC | PRN
  Administered 2016-09-19: 13 mL via INTRAVENOUS

## 2016-09-19 MED ORDER — HYDROCODONE-ACETAMINOPHEN 10-325 MG PO TABS: 1.0000 | ORAL_TABLET | ORAL | 0 refills | Status: DC | PRN

## 2016-09-19 NOTE — Progress Notes (Signed)
Subjective:  Patient ID: Kathryn Young, female    DOB: [birth year]  Age: 70 y.o. MRN: [id number]  CC: Follow-up (1 month) and Headaches   HPI Kathryn Young presents for excrutiating electrical pain in right scalp. Roughly dermatome of trigrminal branch 2. Onset 4 days ago. Temporary relief with toradol given on 12/1 here. Recurred one day later. Crescendo-ing now. 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 Nothing since. Photophobic, eyebrows hurt. Pain increasing daily. 9/10 steadily. No nausea. Sensation is sharp, behind right eye.   History Kathryn Young has a past medical history of Breast mass, right; Hyperlipidemia; Hypertension; and Neuromuscular disorder ([place]).   She has a past surgical history that includes Abdominal hysterectomy; Kidney Stones; Breast surgery (Left); and Breast lumpectomy with radioactive seed localization (Right, 03/27/2016).   Her family history includes Cancer in her mother.She reports that she has never smoked. She has never used smokeless tobacco. She reports that she does not drink alcohol or use drugs.    ROS Review of Systems  Constitutional: Negative for activity change, appetite change and fever.  HENT: Negative for congestion, rhinorrhea and sore throat.   Eyes: Positive for photophobia and visual disturbance.  Respiratory: Negative for cough and shortness of breath.   Cardiovascular: Negative for chest pain and palpitations.  Gastrointestinal: Negative for abdominal pain, diarrhea and nausea.  Genitourinary: Negative for dysuria.  Musculoskeletal: Negative for arthralgias and myalgias.  Neurological: Positive for headaches. Negative for seizures, syncope, speech difficulty and light-headedness.  Psychiatric/Behavioral: Positive for decreased concentration and sleep disturbance. Negative for agitation, behavioral problems, confusion, hallucinations and self-injury. The patient is not hyperactive.     Objective:  BP (!) 155/89   Pulse 65   Temp 97.3 F (36.3  C) (Oral)   Ht 5\' 3"  (1.6 m)   Wt 147 lb 9.6 oz (67 kg)   SpO2 96%   BMI 26.15 kg/m   BP Readings from Last 3 Encounters:  09/19/16 (!) 155/89  09/15/16 (!) 181/96  08/21/16 126/77    Wt Readings from Last 3 Encounters:  09/19/16 147 lb 9.6 oz (67 kg)  09/15/16 150 lb (68 kg)  08/21/16 150 lb (68 kg)     Physical Exam  Constitutional: She is oriented to person, place, and time. She appears well-developed and well-nourished. She appears distressed (wearing sunglasses in darkened room).  HENT:  Head: Normocephalic and atraumatic.  Right Ear: External ear normal.  Left Ear: External ear normal.  Nose: Nose normal.  Mouth/Throat: Oropharynx is clear and moist.  Eyes: Conjunctivae and EOM are normal. Pupils are equal, round, and reactive to light.  Neck: Normal range of motion. Neck supple. No thyromegaly present.  Cardiovascular: Normal rate, regular rhythm and normal heart sounds.   No murmur heard. Pulmonary/Chest: Effort normal and breath sounds normal. No respiratory distress. She has no wheezes. She has no rales.  Abdominal: Soft. Bowel sounds are normal. She exhibits no distension. There is no tenderness.  Lymphadenopathy:    She has no cervical adenopathy.  Neurological: She is alert and oriented to person, place, and time. She has normal reflexes. She displays normal reflexes. No cranial nerve deficit. She exhibits normal muscle tone. Coordination normal.  Skin: Skin is warm and dry.  Psychiatric: Her speech is normal and behavior is normal. Judgment and thought content normal. Her mood appears anxious. Cognition and memory are normal.     Lab Results  Component Value Date   WBC 5.5 08/21/2016   HGB 12.7 03/21/2016   HCT  39.0 08/21/2016   PLT 233 08/21/2016   GLUCOSE 96 08/21/2016   CHOL 188 08/21/2016   TRIG 143 08/21/2016   HDL 53 08/21/2016   LDLCALC 106 (H) 08/21/2016   ALT 16 08/21/2016   AST 20 08/21/2016   NA 142 08/21/2016   K 4.4 08/21/2016   CL  101 08/21/2016   CREATININE 0.72 08/21/2016   BUN 9 08/21/2016   CO2 25 08/21/2016   TSH 2.280 01/26/2014    Dg Esophagus  Result Date: 06/09/2016 CLINICAL DATA:  Gastroesophageal reflux disease. EXAM: ESOPHOGRAM / BARIUM SWALLOW / BARIUM TABLET STUDY TECHNIQUE: Combined double contrast and single contrast examination performed using effervescent crystals, thick barium liquid, and thin barium liquid. The patient was observed with fluoroscopy swallowing a 13 mm barium sulphate tablet. FLUOROSCOPY TIME:  Fluoroscopy Time:  1 minutes and 54 seconds 28 d GY cm2 COMPARISON:  None. FINDINGS: Patient was administered barium contrast orally during fluoroscopic evaluation. Thoracic esophagus appeared normal without mass, ulceration or other focal wall irregularity. A small hiatal hernia was elicited with Valsalva maneuvers. No associated reflux visualized during today's examination. Contrast moved promptly through the thoracic esophagus and into the stomach without evidence of obstruction or dysmotility. A 13 mm barium tablet passed promptly through the esophagus and into the stomach. IMPRESSION: 1. Small hiatal hernia. No associated gastroesophageal reflux visualized during this examination. 2. Otherwise normal barium swallow fluoroscopic examination. Electronically Signed   By: Franki Cabot M.D.   On: 06/09/2016 11:02    Assessment & Plan:   Kathryn Young was seen today for follow-up and headaches.  Diagnoses and all orders for this visit:  Acute intractable headache, unspecified headache type -     betamethasone acetate-betamethasone sodium phosphate (CELESTONE) injection 6 mg; Inject 1 mL (6 mg total) into the muscle once. -     ketorolac (TORADOL) injection 60 mg; Inject 2 mLs (60 mg total) into the muscle once. -     MR BRAIN W WO CONTRAST; Future  Other orders -     HYDROcodone-acetaminophen (NORCO) 10-325 MG tablet; Take 1 tablet by mouth every 4 (four) hours as needed for moderate pain or severe  pain.   I am having Kathryn Young start on HYDROcodone-acetaminophen. I am also having her maintain her calcium-vitamin D, levocetirizine, pantoprazole, raloxifene, simvastatin, DULoxetine, Vitamin D, and gabapentin. We will continue to administer betamethasone acetate-betamethasone sodium phosphate and ketorolac.  Meds ordered this encounter  Medications  . betamethasone acetate-betamethasone sodium phosphate (CELESTONE) injection 6 mg  . ketorolac (TORADOL) injection 60 mg  . HYDROcodone-acetaminophen (NORCO) 10-325 MG tablet    Sig: Take 1 tablet by mouth every 4 (four) hours as needed for moderate pain or severe pain.    Dispense:  18 tablet    Refill:  0     Follow-up: Return in about 3 days (around 09/22/2016) for Headache.  Claretta Fraise, M.D.

## 2016-09-20 ENCOUNTER — Ambulatory Visit: Payer: Medicare Other | Admitting: Family Medicine

## 2016-09-20 LAB — CBC WITH DIFFERENTIAL/PLATELET
BASOS ABS: 0 10*3/uL (ref 0.0–0.2)
Basos: 0 %
EOS (ABSOLUTE): 0.1 10*3/uL (ref 0.0–0.4)
Eos: 1 %
HEMATOCRIT: 42.7 % (ref 34.0–46.6)
HEMOGLOBIN: 14.2 g/dL (ref 11.1–15.9)
Immature Grans (Abs): 0 10*3/uL (ref 0.0–0.1)
Immature Granulocytes: 0 %
LYMPHS ABS: 2.3 10*3/uL (ref 0.7–3.1)
Lymphs: 31 %
MCH: 31.8 pg (ref 26.6–33.0)
MCHC: 33.3 g/dL (ref 31.5–35.7)
MCV: 96 fL (ref 79–97)
MONOCYTES: 8 %
MONOS ABS: 0.6 10*3/uL (ref 0.1–0.9)
NEUTROS ABS: 4.4 10*3/uL (ref 1.4–7.0)
Neutrophils: 60 %
Platelets: 260 10*3/uL (ref 150–379)
RBC: 4.46 x10E6/uL (ref 3.77–5.28)
RDW: 13.3 % (ref 12.3–15.4)
WBC: 7.3 10*3/uL (ref 3.4–10.8)

## 2016-09-20 LAB — CMP14+EGFR
A/G RATIO: 1.6 (ref 1.2–2.2)
ALBUMIN: 4.4 g/dL (ref 3.5–4.8)
ALK PHOS: 106 IU/L (ref 39–117)
ALT: 9 IU/L (ref 0–32)
AST: 13 IU/L (ref 0–40)
BILIRUBIN TOTAL: 0.3 mg/dL (ref 0.0–1.2)
BUN / CREAT RATIO: 14 (ref 12–28)
BUN: 11 mg/dL (ref 8–27)
CO2: 27 mmol/L (ref 18–29)
CREATININE: 0.78 mg/dL (ref 0.57–1.00)
Calcium: 9.8 mg/dL (ref 8.7–10.3)
Chloride: 102 mmol/L (ref 96–106)
GFR calc Af Amer: 89 mL/min/{1.73_m2} (ref >59)
GFR calc non Af Amer: 77 mL/min/{1.73_m2} (ref >59)
GLOBULIN, TOTAL: 2.7 g/dL (ref 1.5–4.5)
Glucose: 98 mg/dL (ref 65–99)
POTASSIUM: 4.4 mmol/L (ref 3.5–5.2)
SODIUM: 144 mmol/L (ref 134–144)
Total Protein: 7.1 g/dL (ref 6.0–8.5)

## 2016-09-20 LAB — SEDIMENTATION RATE: Sed Rate: 6 mm/hr (ref 0–40)

## 2016-09-22 ENCOUNTER — Encounter: Payer: Self-pay | Admitting: Family Medicine

## 2016-09-22 ENCOUNTER — Other Ambulatory Visit: Payer: Self-pay

## 2016-09-22 ENCOUNTER — Ambulatory Visit (INDEPENDENT_AMBULATORY_CARE_PROVIDER_SITE_OTHER): Payer: Medicare Other | Admitting: Family Medicine

## 2016-09-22 VITALS — BP 174/87 | HR 76 | Temp 97.8°F | Ht 63.0 in | Wt 147.8 lb

## 2016-09-22 DIAGNOSIS — G500 Trigeminal neuralgia: Secondary | ICD-10-CM

## 2016-09-22 MED ORDER — HYDROCODONE-ACETAMINOPHEN 10-325 MG PO TABS: 1.0000 | ORAL_TABLET | ORAL | 0 refills | Status: DC | PRN

## 2016-09-22 MED ORDER — KETOROLAC TROMETHAMINE 60 MG/2ML IM SOLN
60.0000 mg | Freq: Once | INTRAMUSCULAR | Status: AC
  Administered 2016-09-22: 60 mg via INTRAMUSCULAR

## 2016-09-22 MED ORDER — CARBAMAZEPINE 200 MG PO TABS: 200.0000 mg | ORAL_TABLET | Freq: Every day | ORAL | 1 refills | Status: DC

## 2016-09-22 MED ORDER — BETAMETHASONE SOD PHOS & ACET 6 (3-3) MG/ML IJ SUSP
6.0000 mg | Freq: Once | INTRAMUSCULAR | Status: AC
  Administered 2016-09-22: 6 mg via INTRAMUSCULAR

## 2016-09-22 NOTE — Progress Notes (Signed)
Subjective:  Patient ID: Kathryn Young, female    DOB: 03-07-1946  Age: 70 y.o. MRN: [id number]  CC: Headache (3 day re check and patient states it is worse)   HPI Kathryn Young presents for excrutiating electrical pain in right scalp. Roughly dermatome of trigrminal branch 2. Onset 4 days ago. Temporary relief with toradol given on 12/1 here. Recurred one day later. Crescendo-ing now. 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 Nothing since. Photophobic, eyebrows hurt.   History Kathryn Young has a past medical history of Breast mass, right; Hyperlipidemia; Hypertension; and Neuromuscular disorder ([place]).   She has a past surgical history that includes Abdominal hysterectomy; Kidney Stones; Breast surgery (Left); and Breast lumpectomy with radioactive seed localization (Right, 03/27/2016).   Her family history includes Cancer in her mother.She reports that she has never smoked. She has never used smokeless tobacco. She reports that she does not drink alcohol or use drugs.    ROS Review of Systems  Constitutional: Positive for activity change and appetite change. Negative for fever.  HENT: Negative for congestion, rhinorrhea and sore throat.   Eyes: Positive for photophobia and visual disturbance.  Respiratory: Negative for cough and shortness of breath.   Cardiovascular: Negative for chest pain and palpitations.  Gastrointestinal: Negative for abdominal pain, diarrhea and nausea.  Genitourinary: Negative for dysuria.  Musculoskeletal: Negative for arthralgias and myalgias.  Neurological: Positive for headaches.    Objective:  BP (!) 174/87   Pulse 76   Temp 97.8 F (36.6 C) (Oral)   Ht 5\' 3"  (1.6 m)   Wt 147 lb 12.8 oz (67 kg)   BMI 26.18 kg/m   BP Readings from Last 3 Encounters:  09/22/16 (!) 174/87  09/19/16 (!) 155/89  09/15/16 (!) 181/96    Wt Readings from Last 3 Encounters:  09/22/16 147 lb 12.8 oz (67 kg)  09/19/16 147 lb 9.6 oz (67 kg)  09/15/16 150 lb (68 kg)      Physical Exam  Constitutional: She is oriented to person, place, and time. She appears well-developed and well-nourished. She appears distressed (wearing sunglasses in dark room).  HENT:  Head: Normocephalic and atraumatic.  Right Ear: External ear normal.  Left Ear: External ear normal.  Nose: Nose normal.  Mouth/Throat: Oropharynx is clear and moist.  Eyes: Conjunctivae and EOM are normal. Pupils are equal, round, and reactive to light.  Neck: Normal range of motion. Neck supple. No thyromegaly present.  Cardiovascular: Normal rate, regular rhythm and normal heart sounds.   No murmur heard. Pulmonary/Chest: Effort normal and breath sounds normal. No respiratory distress. She has no wheezes. She has no rales.  Abdominal: Soft. Bowel sounds are normal. She exhibits no distension. There is no tenderness.  Musculoskeletal: Normal range of motion.  Lymphadenopathy:    She has no cervical adenopathy.  Neurological: She is alert and oriented to person, place, and time. She has normal reflexes.  Skin: Skin is warm and dry.  Psychiatric: She has a normal mood and affect. Her behavior is normal. Judgment and thought content normal.     Lab Results  Component Value Date   WBC 7.3 09/19/2016   HGB 12.7 03/21/2016   HCT 42.7 09/19/2016   PLT 260 09/19/2016   GLUCOSE 98 09/19/2016   CHOL 188 08/21/2016   TRIG 143 08/21/2016   HDL 53 08/21/2016   LDLCALC 106 (H) 08/21/2016   ALT 9 09/19/2016   AST 13 09/19/2016   NA 144 09/19/2016   K 4.4 09/19/2016  CL 102 09/19/2016   CREATININE 0.78 09/19/2016   BUN 11 09/19/2016   CO2 27 09/19/2016   TSH 2.280 01/26/2014    Dg Esophagus  Result Date: 06/09/2016 CLINICAL DATA:  Gastroesophageal reflux disease. EXAM: ESOPHOGRAM / BARIUM SWALLOW / BARIUM TABLET STUDY TECHNIQUE: Combined double contrast and single contrast examination performed using effervescent crystals, thick barium liquid, and thin barium liquid. The patient was  observed with fluoroscopy swallowing a 13 mm barium sulphate tablet. FLUOROSCOPY TIME:  Fluoroscopy Time:  1 minutes and 54 seconds 28 d GY cm2 COMPARISON:  None. FINDINGS: Patient was administered barium contrast orally during fluoroscopic evaluation. Thoracic esophagus appeared normal without mass, ulceration or other focal wall irregularity. A small hiatal hernia was elicited with Valsalva maneuvers. No associated reflux visualized during today's examination. Contrast moved promptly through the thoracic esophagus and into the stomach without evidence of obstruction or dysmotility. A 13 mm barium tablet passed promptly through the esophagus and into the stomach. IMPRESSION: 1. Small hiatal hernia. No associated gastroesophageal reflux visualized during this examination. 2. Otherwise normal barium swallow fluoroscopic examination. Electronically Signed   By: Franki Cabot M.D.   On: 06/09/2016 11:02    Assessment & Plan:   Kathryn Young was seen today for headache.  Diagnoses and all orders for this visit:  Trigeminal neuralgia of right side of face -     betamethasone acetate-betamethasone sodium phosphate (CELESTONE) injection 6 mg; Inject 1 mL (6 mg total) into the muscle once. -     ketorolac (TORADOL) injection 60 mg; Inject 2 mLs (60 mg total) into the muscle once.  Other orders -     carbamazepine (TEGRETOL) 200 MG tablet; Take 1 tablet (200 mg total) by mouth daily.   I have discontinued Kathryn Young's gabapentin. I am also having her start on carbamazepine. Additionally, I am having her maintain her calcium-vitamin D, levocetirizine, pantoprazole, raloxifene, simvastatin, DULoxetine, and Vitamin D. We administered betamethasone acetate-betamethasone sodium phosphate and ketorolac.  Meds ordered this encounter  Medications  . betamethasone acetate-betamethasone sodium phosphate (CELESTONE) injection 6 mg  . ketorolac (TORADOL) injection 60 mg  . carbamazepine (TEGRETOL) 200 MG tablet    Sig:  Take 1 tablet (200 mg total) by mouth daily.    Dispense:  30 tablet    Refill:  1     Follow-up: Return in about 5 days (around 09/27/2016).  Claretta Fraise, M.D.

## 2016-09-27 ENCOUNTER — Encounter: Payer: Self-pay | Admitting: Family Medicine

## 2016-09-27 ENCOUNTER — Ambulatory Visit (INDEPENDENT_AMBULATORY_CARE_PROVIDER_SITE_OTHER): Payer: Medicare Other | Admitting: Family Medicine

## 2016-09-27 VITALS — BP 156/82 | HR 74 | Temp 98.8°F | Ht 63.0 in | Wt 146.4 lb

## 2016-09-27 DIAGNOSIS — R51 Headache: Secondary | ICD-10-CM

## 2016-09-27 DIAGNOSIS — R519 Headache, unspecified: Secondary | ICD-10-CM

## 2016-09-27 DIAGNOSIS — G500 Trigeminal neuralgia: Secondary | ICD-10-CM | POA: Diagnosis not present

## 2016-09-27 MED ORDER — KETOROLAC TROMETHAMINE 60 MG/2ML IM SOLN
60.0000 mg | Freq: Once | INTRAMUSCULAR | Status: AC
  Administered 2016-09-27: 60 mg via INTRAMUSCULAR

## 2016-09-27 MED ORDER — CARBAMAZEPINE 200 MG PO TABS: 200.0000 mg | ORAL_TABLET | Freq: Three times a day (TID) | ORAL | 1 refills | Status: DC

## 2016-09-27 MED ORDER — HYDROCODONE-ACETAMINOPHEN 10-325 MG PO TABS: 1.0000 | ORAL_TABLET | ORAL | 0 refills | Status: DC | PRN

## 2016-09-27 NOTE — Progress Notes (Signed)
Subjective:  Patient ID: Kathryn Young, female    DOB: 08-12-1946  Age: 70 y.o. 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 Pain still rates 8-10/10. She still describes it as an Dealer shock. She is photosensitive as well as noise sensitive. She says just touching the skin above her lip causes the pain to become more intense over her right eye. She cannot chew because the pain is so severe in the right temple. MRI again reviewed showing some remote pinpoint infarcts and sedimentation rate done one week ago was normal.  She now reports a buzzing and sounded static in her head. She also has a feeling like there is a hair in her eye and on the eyelid. She describes a burning on the top of her head down to the right eyelid as well. Patient is very concerned that she had a dental appointment on November 21. At that time she had local anesthesia for a pocket over the eye tooth being cleaned out in front teeth being filled. Her neck was in terrible pain at the time. 9 days later is when the current symptoms began.   History Kathryn Young has a past medical history of Breast mass, right; Hyperlipidemia; Hypertension; and Neuromuscular disorder ([place]).   She has a past surgical history that includes Abdominal hysterectomy; Kidney Stones; Breast surgery (Left); and Breast lumpectomy with radioactive seed localization (Right, 03/27/2016).   Her family history includes Cancer in her mother.She reports that she has never smoked. She has never used smokeless tobacco. She reports that she does not drink alcohol or use drugs.    ROS Review of Systems  Constitutional: Positive for appetite change (decreased by pain with chewing). Negative for activity change and fever.  HENT: Negative for congestion, rhinorrhea and sore throat.   Eyes: Positive for photophobia  and pain (right - behind eye). Negative for visual disturbance.  Respiratory: Negative for cough and shortness of breath.   Cardiovascular: Negative for chest pain and palpitations.  Gastrointestinal: Negative for abdominal pain, diarrhea and nausea.  Endocrine: Polyuria: painful to talk - hurts above right eye.  Genitourinary: Negative for dysuria.  Musculoskeletal: Negative for arthralgias and myalgias.  Neurological: Positive for facial asymmetry, speech difficulty and headaches. Negative for dizziness, tremors, seizures, syncope, light-headedness and numbness.  Psychiatric/Behavioral: Positive for decreased concentration. Negative for agitation and confusion. The patient is not nervous/anxious.     Objective:  BP (!) 156/82   Pulse 74   Temp 98.8 F (37.1 C) (Oral)   Ht '5\' 3"'  (1.6 m)   Wt 146 lb 6 oz (66.4 kg)   BMI 25.93 kg/m   BP Readings from Last 3 Encounters:  09/27/16 (!) 156/82  09/22/16 (!) 174/87  09/19/16 (!) 155/89    Wt Readings from Last 3 Encounters:  09/27/16 146 lb 6 oz (66.4 kg)  09/22/16 147 lb 12.8 oz (67 kg)  09/19/16 147 lb 9.6 oz (67 kg)     Physical Exam  Constitutional: She is oriented to person, place, and time. She appears well-developed and well-nourished. She appears distressed.  HENT:  Head: Normocephalic and atraumatic.  Patient barely moves her mouth while talking due to the pain generated in the temple.  Eyes: Conjunctivae are normal. Pupils are equal, round, and reactive to light.  Neck: Normal range of motion. Neck supple.  No thyromegaly present.  Cardiovascular: Normal rate, regular rhythm and normal heart sounds.   No murmur heard. Pulmonary/Chest: Effort normal and breath sounds normal. No respiratory distress. She has no wheezes. She has no rales.  Abdominal: Soft. Bowel sounds are normal. She exhibits no distension. There is no tenderness.  Musculoskeletal: Normal range of motion.  Lymphadenopathy:    She has no cervical  adenopathy.  Neurological: She is alert and oriented to person, place, and time.  Skin: Skin is warm and dry.  Psychiatric: She has a normal mood and affect. Her behavior is normal. Judgment and thought content normal.     Lab Results  Component Value Date   WBC 7.3 09/19/2016   HGB 12.7 03/21/2016   HCT 42.7 09/19/2016   PLT 260 09/19/2016   GLUCOSE 98 09/19/2016   CHOL 188 08/21/2016   TRIG 143 08/21/2016   HDL 53 08/21/2016   LDLCALC 106 (H) 08/21/2016   ALT 9 09/19/2016   AST 13 09/19/2016   NA 144 09/19/2016   K 4.4 09/19/2016   CL 102 09/19/2016   CREATININE 0.78 09/19/2016   BUN 11 09/19/2016   CO2 27 09/19/2016   TSH 2.280 01/26/2014    Mr Brain W DJ Contrast  Result Date: 09/19/2016 CLINICAL DATA:  Initial evaluation for severe right-sided headaches for past 4 days. EXAM: MRI HEAD WITHOUT AND WITH CONTRAST TECHNIQUE: Multiplanar, multiecho pulse sequences of the brain and surrounding structures were obtained without and with intravenous contrast. CONTRAST:  32m MULTIHANCE GADOBENATE DIMEGLUMINE 529 MG/ML IV SOLN COMPARISON:  None available. FINDINGS: Brain: Mild diffuse prominence of the CSF containing spaces is compatible with generalized age-related cerebral atrophy. Scattered subcentimeter T2/FLAIR hyperintense foci within the periventricular, deep, and subcortical white matter of both cerebral hemispheres noted, nonspecific, but most like related to mild chronic small vessel ischemic disease. Possible changes related to underlying migrainous disorder could also be considered. No abnormal foci of restricted diffusion to suggest acute or subacute ischemia. Gray-white matter differentiation maintained. No evidence for acute or chronic intracranial hemorrhage. Small remote lacunar infarct noted within the left thalamus. Additional few small remote left cerebellar infarcts noted as well. No mass lesion, midline shift, or mass effect. Ventricles are normal in size without  evidence for hydrocephalus. No abnormal enhancement. No extra-axial fluid collection. Major dural sinuses are grossly patent. Pituitary gland and suprasellar region within normal limits. Midline structures intact. Vascular: Major intracranial vascular flow voids are maintained. Skull and upper cervical spine: Craniocervical junction normal. Visualized upper cervical spine unremarkable. Bone marrow signal intensity within normal limits. No scalp soft tissue abnormality. Sinuses/Orbits: Globes and orbital soft tissues within normal limits. Small retention cyst noted within the right maxillary sinus. Paranasal sinuses are otherwise clear. Mild scattered opacity within the right mastoid air cells. Inner ear structures grossly normal. IMPRESSION: 1. No acute or reversible intracranial process identified. 2. 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 3. Mild scattered subcentimeter T2/FLAIR hyperintense foci within the supratentorial cerebral white matter, nonspecific, but most commonly related to chronic microvascular ischemic changes. Foci such disease have also been described with underlying migrainous disorder. Overall, these are mild in nature for patient age. Electronically Signed   By: BJeannine BogaM.D.   On: 09/19/2016 18:39    Assessment & Plan:   RMaayanwas seen today for trigeminal neuralgia.  Diagnoses and all orders for this visit:  Trigeminal neuralgia of right side of face -  Ambulatory referral to Neurology -     Sedimentation rate -     CBC with Differential/Platelet -     CMP14+EGFR -     ketorolac (TORADOL) injection 60 mg; Inject 2 mLs (60 mg total) into the muscle once.  Acute intractable headache, unspecified headache type -     Ambulatory referral to Neurology  Other orders -     carbamazepine (TEGRETOL) 200 MG tablet; Take 1 tablet (200 mg total) by mouth 3 (three) times daily. -      HYDROcodone-acetaminophen (NORCO) 10-325 MG tablet; Take 1 tablet by mouth every 4 (four) hours as needed for moderate pain or severe pain.      I have discontinued Kathryn Young's gabapentin. I have also changed her carbamazepine. Additionally, I am having her maintain her calcium-vitamin D, levocetirizine, pantoprazole, raloxifene, simvastatin, DULoxetine, Vitamin D, and HYDROcodone-acetaminophen. We administered ketorolac.  Meds ordered this encounter  Medications  . DISCONTD: gabapentin (NEURONTIN) 300 MG capsule  . carbamazepine (TEGRETOL) 200 MG tablet    Sig: Take 1 tablet (200 mg total) by mouth 3 (three) times daily.    Dispense:  90 tablet    Refill:  1  . ketorolac (TORADOL) injection 60 mg  . HYDROcodone-acetaminophen (NORCO) 10-325 MG tablet    Sig: Take 1 tablet by mouth every 4 (four) hours as needed for moderate pain or severe pain.    Dispense:  24 tablet    Refill:  0     Follow-up: Return in about 5 days (around 10/02/2016) for Unless seen first by neurology.  Claretta Fraise, M.D.

## 2016-09-28 LAB — CMP14+EGFR
ALBUMIN: 4.3 g/dL (ref 3.5–4.8)
ALK PHOS: 113 IU/L (ref 39–117)
ALT: 8 IU/L (ref 0–32)
AST: 9 IU/L (ref 0–40)
Albumin/Globulin Ratio: 1.8 (ref 1.2–2.2)
BUN/Creatinine Ratio: 16 (ref 12–28)
BUN: 14 mg/dL (ref 8–27)
Bilirubin Total: 0.3 mg/dL (ref 0.0–1.2)
CO2: 27 mmol/L (ref 18–29)
CREATININE: 0.89 mg/dL (ref 0.57–1.00)
Calcium: 9.7 mg/dL (ref 8.7–10.3)
Chloride: 98 mmol/L (ref 96–106)
GFR calc Af Amer: 76 mL/min/{1.73_m2} (ref >59)
GFR, EST NON AFRICAN AMERICAN: 66 mL/min/{1.73_m2} (ref >59)
GLOBULIN, TOTAL: 2.4 g/dL (ref 1.5–4.5)
Glucose: 88 mg/dL (ref 65–99)
POTASSIUM: 3.8 mmol/L (ref 3.5–5.2)
SODIUM: 140 mmol/L (ref 134–144)
Total Protein: 6.7 g/dL (ref 6.0–8.5)

## 2016-09-28 LAB — CBC WITH DIFFERENTIAL/PLATELET
BASOS: 0 %
Basophils Absolute: 0 10*3/uL (ref 0.0–0.2)
EOS (ABSOLUTE): 0.4 10*3/uL (ref 0.0–0.4)
EOS: 3 %
HEMATOCRIT: 40.4 % (ref 34.0–46.6)
HEMOGLOBIN: 13.9 g/dL (ref 11.1–15.9)
Immature Grans (Abs): 0 10*3/uL (ref 0.0–0.1)
Immature Granulocytes: 0 %
LYMPHS ABS: 2.9 10*3/uL (ref 0.7–3.1)
Lymphs: 27 %
MCH: 32.3 pg (ref 26.6–33.0)
MCHC: 34.4 g/dL (ref 31.5–35.7)
MCV: 94 fL (ref 79–97)
MONOCYTES: 7 %
MONOS ABS: 0.7 10*3/uL (ref 0.1–0.9)
NEUTROS ABS: 6.7 10*3/uL (ref 1.4–7.0)
Neutrophils: 63 %
Platelets: 252 10*3/uL (ref 150–379)
RBC: 4.3 x10E6/uL (ref 3.77–5.28)
RDW: 13.1 % (ref 12.3–15.4)
WBC: 10.8 10*3/uL (ref 3.4–10.8)

## 2016-09-28 LAB — SEDIMENTATION RATE: Sed Rate: 4 mm/hr (ref 0–40)

## 2016-10-02 ENCOUNTER — Ambulatory Visit (INDEPENDENT_AMBULATORY_CARE_PROVIDER_SITE_OTHER): Payer: Medicare Other | Admitting: Neurology

## 2016-10-02 ENCOUNTER — Encounter: Payer: Self-pay | Admitting: Neurology

## 2016-10-02 VITALS — BP 174/82 | HR 78 | Ht 63.5 in | Wt 143.5 lb

## 2016-10-02 DIAGNOSIS — G500 Trigeminal neuralgia: Secondary | ICD-10-CM | POA: Diagnosis not present

## 2016-10-02 DIAGNOSIS — G501 Atypical facial pain: Secondary | ICD-10-CM

## 2016-10-02 NOTE — Progress Notes (Signed)
Reason for visit: Headache  Referring physician: Dr. Ree Edman is a 70 y.o. female  History of present illness:  Kathryn Young is a 70 year old right-handed white female with history of migraine headache. The patient has had migraine off and on throughout her life that eventually essentially went away after her hysterectomy. The patient comes in with a new type of headache that began about 2 weeks ago. The patient had sudden onset of severe sharp jabbing electric shock type pain that began in the V1 distribution of the head, but the patient will have occasional discomfort down into the lower part of the face as well. The pain is only on the right side. The patient has noted hypersensitivity to light touch on the forehead, she indicates that the jabs of pain may last 15-30 seconds and then go away, and recur every 30 minutes to an hour and a half. The patient thinks that talking may induce the headache pain, but she also has extreme photophobia and phonophobia that will activate the pain. The patient became hypersensitive to odors as well. She denies any nausea or vomiting, she denies any cognitive clouding with the headache. The patient has a history of vertigo, but she has not had vertigo with this event and her prior episodes of vertigo are unassociated with headache. The patient denies any numbness or weakness of the face, arms, or legs. She denies any slurred speech, difficulty swallowing, visual disturbances such as double vision or loss of vision. The patient has been placed on carbamazepine with excellent benefit, the patient has not had any pain over the last several days. She is sent to this office for an evaluation.  Past Medical History:  Diagnosis Date  . Brain infection    Age 72  . Breast mass, right   . Hyperlipidemia   . Hypertension    no meds  . Meningitis    Age 53  . Neuromuscular disorder ([place])    right shoulder with burning down arm    Past Surgical  History:  Procedure Laterality Date  . ABDOMINAL HYSTERECTOMY  1990  . BREAST LUMPECTOMY WITH RADIOACTIVE SEED LOCALIZATION Right 03/27/2016   Procedure: RIGHT BREAST LUMPECTOMYAFTER RADIOACTIVE SEED LOCALIZATION;  Surgeon: Jackolyn Confer, MD;  Location: [facility];  Service: General;  Laterality: Right;  RIGHT BREAST LUMPECTOMYAFTER RADIOACTIVE SEED LOCALIZATION  . BREAST SURGERY Left 1981   benign cysts removed  . Kidney Stones      Family History  Problem Relation Age of Onset  . Cancer Mother     Breast  . Other Father     Drowned  . Prostate cancer Paternal Grandfather   . Heart attack Paternal Grandfather     Social history:  reports that she has never smoked. She has never used smokeless tobacco. She reports that she does not drink alcohol or use drugs.  Medications:  Prior to Admission medications   Medication Sig Start Date End Date Taking? Authorizing Provider  calcium-vitamin D (OSCAL WITH D) 500-200 MG-UNIT tablet Take 1 tablet by mouth 2 (two) times daily. 08/23/15  Yes Claretta Fraise, MD  carbamazepine (TEGRETOL) 200 MG tablet Take 1 tablet (200 mg total) by mouth 3 (three) times daily. 09/27/16  Yes Claretta Fraise, MD  Cholecalciferol (VITAMIN D) 2000 units tablet Take one tab po qe 09/12/16  Yes Claretta Fraise, MD  DULoxetine (CYMBALTA) 30 MG capsule TAKE (1) CAPSULE DAILY 09/12/16  Yes Claretta Fraise, MD  levocetirizine (XYZAL) 5 MG  tablet TAKE 1 TABLET IN THE EVENING 05/08/16  Yes Claretta Fraise, MD  pantoprazole (PROTONIX) 40 MG tablet Take 1 tablet (40 mg total) by mouth daily. For stomach 08/21/16  Yes Claretta Fraise, MD  raloxifene (EVISTA) 60 MG tablet Take 1 tablet (60 mg total) by mouth daily. 08/30/16  Yes Claretta Fraise, MD  simvastatin (ZOCOR) 20 MG tablet TAKE 1 TABLET DAILY 08/30/16  Yes Claretta Fraise, MD     No Known Allergies  ROS:  Out of a complete 14 system review of symptoms, the patient complains only of the following symptoms, and all  other reviewed systems are negative.  Eye pain Headache, restless legs  Blood pressure (!) 174/82, pulse 78, height 5' 3.5" (1.613 m), weight 143 lb 8 oz (65.1 kg).  Physical Exam  General: The patient is alert and cooperative at the time of the examination.  Eyes: Pupils are equal, round, and reactive to light. Discs are flat bilaterally.  Neck: The neck is supple, no carotid bruits are noted.  Respiratory: The respiratory examination is clear.  Cardiovascular: The cardiovascular examination reveals a regular rate and rhythm, no obvious murmurs or rubs are noted.  Skin: Extremities are without significant edema.  Neurologic Exam  Mental status: The patient is alert and oriented x 3 at the time of the examination. The patient has apparent normal recent and remote memory, with an apparently normal attention span and concentration ability.  Cranial nerves: Facial symmetry is present. There is good sensation of the face to pinprick and soft touch bilaterally. The strength of the facial muscles and the muscles to head turning and shoulder shrug are normal bilaterally. Speech is well enunciated, no aphasia or dysarthria is noted. Extraocular movements are full. Visual fields are full. The tongue is midline, and the patient has symmetric elevation of the soft palate. No obvious hearing deficits are noted.  Motor: The motor testing reveals 5 over 5 strength of all 4 extremities. Good symmetric motor tone is noted throughout.  Sensory: Sensory testing is intact to pinprick, soft touch, vibration sensation, and position sense on all 4 extremities. No evidence of extinction is noted.  Coordination: Cerebellar testing reveals good finger-nose-finger and heel-to-shin bilaterally.  Gait and station: Gait is normal. Tandem gait is normal. Romberg is negative. No drift is seen.  Reflexes: Deep tendon reflexes are symmetric and normal bilaterally. Toes are downgoing  bilaterally.   Assessment/Plan:  1. Atypical facial pain, right  The description of the pain is not consistent with trigeminal neuralgia, but the patient has responded very well to carbamazepine. The patient reports a prior history of migraine. The current headache pain is not consistent with typical migraine either. It is possible that the patient may have a SUNCT headache syndrome. The headache pain is associated with a distribution primarily in the V1 distribution, and is associated with photophobia, phonophobia, and hypersensitivity to odors. These features are not associated with typical trigeminal neuralgia. At any rate, we will continue the carbamazepine, the patient will follow-up in 2 or 3 months, if the headaches do not recur at that time, we will consider a taper off of the medication.  [name] 10/02/2016 8:51 AM  [facility] [address] [place], [place] [address]  Phone [phone] Fax [phone]

## 2016-10-11 ENCOUNTER — Telehealth: Payer: Self-pay

## 2016-10-11 ENCOUNTER — Other Ambulatory Visit: Payer: Self-pay | Admitting: Neurology

## 2016-10-11 DIAGNOSIS — Z79899 Other long term (current) drug therapy: Secondary | ICD-10-CM

## 2016-10-11 MED ORDER — CARBAMAZEPINE 200 MG PO TABS: 400.0000 mg | ORAL_TABLET | Freq: Two times a day (BID) | ORAL | 1 refills | Status: DC

## 2016-10-11 NOTE — Telephone Encounter (Signed)
They are ordered, I will cc Dr. Jannifer [facility] on this so he know what changes were made to management thanks

## 2016-10-11 NOTE — Telephone Encounter (Signed)
New pt w/ h/o migraines seen as new patient by Dr. Jannifer [facility] last Monday. Husband called this morning to report that pt has been having worsening headaches over the past week, also having severe facial pain that is interfering w/ chewing/talking. She continues Tegretol 200 mg TID which has been beneficial in the past. She is also on Cymbalta 30 mg daily and has started taking Norco again prn for pain. Suggested that she try OTC NSAID, such as ibuprofen, to help w/ inflammation. Aware that Dr. Jannifer [facility] is out of the office today. Will forward to work-in MD.

## 2016-10-11 NOTE — Telephone Encounter (Signed)
Returned TC to pt's husband and they are agreeable to increase in carbamazepine dosage.

## 2016-10-11 NOTE — Telephone Encounter (Signed)
Spoke to pt's husband again. Gave new dosing instructions for Tegretol which he was able to read back. Aware to call if pt has any of the above side effects. Agreeable to returning to [facility] in 1 month for recommended labs.

## 2016-10-11 NOTE — Telephone Encounter (Signed)
Instead of 200mg  three times a day I changed it to 400mg  twice daily (2 pills twice daily).  She should have labs drawn in a another month a cbc and bmp if she likes we can have it done here I can place the order. If she has any side effects to the incerased dose she is to call us. Below are some side effects to review with patient thanks.   Carbamazepine tablets What is this medicine? CARBAMAZEPINE (kar ba MAZ e peen) is used to control seizures caused by certain types of epilepsy. This medicine is also used to treat nerve related pain. It is not for common aches and pains. This medicine may be used for other purposes; ask your health care provider or pharmacist if you have questions. COMMON BRAND NAME(S): Epitol, Tegretol What should I tell my health care provider before I take this medicine? They need to know if you have any of these conditions: -Asian ancestry -bone marrow disease -glaucoma -heart disease or irregular heartbeat -kidney disease -liver disease -low blood counts, like low white cell, platelet, or red cell counts -porphyria -psychotic disorders -suicidal thoughts, plans, or attempt; a previous suicide attempt by you or a family member -an unusual or allergic reaction to carbamazepine, tricyclic antidepressants, phenytoin, phenobarbital or other medicines, foods, dyes, or preservatives -pregnant or trying to get pregnant -breast-feeding How should I use this medicine? Take this medicine by mouth with a glass of water. Follow the directions on the prescription label. Take this medicine with food. Take your doses at regular intervals. Do not take your medicine more often than directed. Do not stop taking this medicine except on the advice of your doctor or health care professional. A special MedGuide will be given to you by the pharmacist with each prescription and refill. Be sure to read this information carefully each time. Talk to your pediatrician regarding the use of this  medicine in children. Special care may be needed. Overdosage: If you think you have taken too much of this medicine contact a poison control center or emergency room at once. NOTE: This medicine is only for you. Do not share this medicine with others. What if I miss a dose? If you miss a dose, take it as soon as you can. If it is almost time for your next dose, take only that dose. Do not take double or extra doses. What may interact with this medicine? Do not take this medicine with any of the following medications: -certain medicines used to treat HIV infection or AIDS that are given in combination with cobicistat - delavirdine - MAOIs like Carbex, Eldepryl, Marplan, Nardil, and Parnate - nefazodone - oxcarbazepine This medicine may also interact with the following medications: - acetaminophen - acetazolamide - barbiturate medicines for inducing sleep or treating seizures, like phenobarbital - certain antibiotics like clarithromycin, erythromycin or troleandomycin - cimetidine - cyclosporine - danazol - dicumarol - doxycycline - female hormones, including estrogens and birth control pills - grapefruit juice - isoniazid, INH - levothyroxine and other thyroid hormones - lithium and other medicines to treat mood problems or psychotic disturbances - loratadine - medicines for angina or high blood pressure - medicines for cancer - medicines for depression or anxiety - medicines for sleep - medicines to treat fungal infections, like fluconazole, itraconazole or ketoconazole - medicines used to treat HIV infection or AIDS - methadone - niacinamide - praziquantel - propoxyphene - rifampin or rifabutin - seizure or epilepsy medicine - steroid medicines such as prednisone or  cortisone - theophylline - tramadol - warfarin This list may not describe all possible interactions. Give your health care provider a list of all the medicines, herbs,  non-prescription drugs, or dietary supplements you use. Also tell them if you smoke, drink alcohol, or use illegal drugs. Some items may interact with your medicine. What should I watch for while using this medicine? Visit your doctor or health care professional for a regular check on your progress. Do not change brands or dosage forms of this medicine without discussing the change with your doctor or health care professional. If you are taking this medicine for epilepsy (seizures) do not stop taking it suddenly. This increases the risk of seizures. Wear a Probation officer or necklace. Carry an identification card with information about your condition, medications, and doctor or health care professional. Dennis Bast may get drowsy, dizzy, or have blurred vision. Do not drive, use machinery, or do anything that needs mental alertness until you know how this medicine affects you. To reduce dizzy or fainting spells, do not sit or stand up quickly, especially if you are an older patient. Alcohol can increase drowsiness and dizziness. Avoid alcoholic drinks. Birth control pills may not work properly while you are taking this medicine. Talk to your doctor about using an extra method of birth control. This medicine can make you more sensitive to the sun. Keep out of the sun. If you cannot avoid being in the sun, wear protective clothing and use sunscreen. Do not use sun lamps or tanning beds/booths. The use of this medicine may increase the chance of suicidal thoughts or actions. Pay special attention to how you are responding while on this medicine. Any worsening of mood, or thoughts of suicide or dying should be reported to your health care professional right away. Women who become pregnant while using this medicine may enroll in the [place] Pregnancy Registry by calling [phone]. This registry collects information about the safety of antiepileptic drug use during pregnancy. What side  effects may I notice from receiving this medicine? Side effects that you should report to your doctor or health care professional as soon as possible: -allergic reactions like skin rash, itching or hives, swelling of the face, lips, or tongue -breathing problems -changes in vision -confusion -dark urine -fast or irregular heartbeat -fever or chills, sore throat -mouth ulcers -pain or difficulty passing urine -redness, blistering, peeling or loosening of the skin, including inside the mouth -ringing in the ears -seizures -stomach pain -swollen joints or muscle/joint aches and pains -unusual bleeding or bruising -unusually weak or tired -vomiting -worsening of mood, thoughts or actions of suicide or dying -yellowing of the eyes or skin Side effects that usually do not require medical attention (report to your doctor or health care professional if they continue or are bothersome): -clumsiness or unsteadiness -diarrhea or constipation -headache -increased sweating -nausea This list may not describe all possible side effects. Call your doctor for medical advice about side effects. You may report side effects to FDA at 1-800-FDA-1088. Where should I keep my medicine? Keep out of reach of children. Store at room temperature below 30 degrees C (86 degrees F). Keep container tightly closed. Protect from moisture. Throw away any unused medicine after the expiration date. NOTE: This sheet is a summary. It may not cover all possible information. If you have questions about this medicine, talk to your doctor, pharmacist, or health care provider.  2017 Elsevier/Gold Standard (2014-09-24 15:38:34)

## 2016-10-11 NOTE — Telephone Encounter (Signed)
Anderson Malta, we could also increase her carbamazepine if she is willing please discuss and let me know.

## 2016-10-23 ENCOUNTER — Ambulatory Visit: Payer: Self-pay | Admitting: Neurology

## 2016-10-24 ENCOUNTER — Telehealth: Payer: Self-pay | Admitting: Neurology

## 2016-10-24 DIAGNOSIS — Z5181 Encounter for therapeutic drug level monitoring: Secondary | ICD-10-CM

## 2016-10-24 MED ORDER — TOPIRAMATE 25 MG PO TABS: 25.0000 mg | ORAL_TABLET | Freq: Two times a day (BID) | ORAL | 3 refills | Status: DC

## 2016-10-24 NOTE — Telephone Encounter (Signed)
Pt reports that she's increased carbamazepine to 400 mg twice a day. Says that she's been having severe break through pain during the day along with light-sensitivity. States that she's unable to bend over, look up, pull clothing over her head and even swallowing aggravates nerve pain. Ibuprofen has not helped much and she's taken Norco twice today for relief. Asking if she should increase carbamazepine even further.

## 2016-10-24 NOTE — Addendum Note (Signed)
Addended by: Margette Fast on: 10/24/2016 06:00 PM   Modules accepted: Orders

## 2016-10-24 NOTE — Telephone Encounter (Signed)
Patient called in reference to carbamazepine (TEGRETOL) 200 MG tablet.  Patient is having really bad eye pain.  Please call

## 2016-10-24 NOTE — Telephone Encounter (Signed)
I called the patient. The patient only got a few days of relief with the carbamazepine, even on 400 mg twice daily, she is still having discomfort. The patient does not have typical neuralgia pain, I will start Topamax for the discomfort, she will come and get carbamazepine levels drawn. If the Topamax is not well tolerated, gabapentin will be used.

## 2016-10-25 ENCOUNTER — Other Ambulatory Visit (INDEPENDENT_AMBULATORY_CARE_PROVIDER_SITE_OTHER): Payer: Self-pay

## 2016-10-25 DIAGNOSIS — Z0289 Encounter for other administrative examinations: Secondary | ICD-10-CM

## 2016-10-25 DIAGNOSIS — Z5181 Encounter for therapeutic drug level monitoring: Secondary | ICD-10-CM | POA: Diagnosis not present

## 2016-10-26 ENCOUNTER — Telehealth: Payer: Self-pay | Admitting: Neurology

## 2016-10-26 LAB — CBC WITH DIFFERENTIAL/PLATELET
BASOS ABS: 0 10*3/uL (ref 0.0–0.2)
Basos: 0 %
EOS (ABSOLUTE): 0.1 10*3/uL (ref 0.0–0.4)
Eos: 2 %
HEMOGLOBIN: 13.1 g/dL (ref 11.1–15.9)
Hematocrit: 39.1 % (ref 34.0–46.6)
IMMATURE GRANS (ABS): 0 10*3/uL (ref 0.0–0.1)
IMMATURE GRANULOCYTES: 0 %
LYMPHS: 44 %
Lymphocytes Absolute: 2.7 10*3/uL (ref 0.7–3.1)
MCH: 31.3 pg (ref 26.6–33.0)
MCHC: 33.5 g/dL (ref 31.5–35.7)
MCV: 94 fL (ref 79–97)
MONOCYTES: 7 %
Monocytes Absolute: 0.4 10*3/uL (ref 0.1–0.9)
NEUTROS PCT: 47 %
Neutrophils Absolute: 3 10*3/uL (ref 1.4–7.0)
PLATELETS: 240 10*3/uL (ref 150–379)
RBC: 4.18 x10E6/uL (ref 3.77–5.28)
RDW: 13.6 % (ref 12.3–15.4)
WBC: 6.2 10*3/uL (ref 3.4–10.8)

## 2016-10-26 LAB — COMPREHENSIVE METABOLIC PANEL
ALT: 11 IU/L (ref 0–32)
AST: 14 IU/L (ref 0–40)
Albumin/Globulin Ratio: 1.7 (ref 1.2–2.2)
Albumin: 4.3 g/dL (ref 3.5–4.8)
Alkaline Phosphatase: 98 IU/L (ref 39–117)
BUN/Creatinine Ratio: 15 (ref 12–28)
BUN: 11 mg/dL (ref 8–27)
CALCIUM: 9.2 mg/dL (ref 8.7–10.3)
CHLORIDE: 96 mmol/L (ref 96–106)
CO2: 28 mmol/L (ref 18–29)
CREATININE: 0.75 mg/dL (ref 0.57–1.00)
GFR, EST AFRICAN AMERICAN: 93 mL/min/{1.73_m2} (ref >59)
GFR, EST NON AFRICAN AMERICAN: 81 mL/min/{1.73_m2} (ref >59)
GLUCOSE: 110 mg/dL — AB (ref 65–99)
Globulin, Total: 2.5 g/dL (ref 1.5–4.5)
Potassium: 4.2 mmol/L (ref 3.5–5.2)
Sodium: 137 mmol/L (ref 134–144)
TOTAL PROTEIN: 6.8 g/dL (ref 6.0–8.5)

## 2016-10-26 LAB — CARBAMAZEPINE LEVEL, TOTAL: CARBAMAZEPINE LVL: 12.3 ug/mL — AB (ref 4.0–12.0)

## 2016-10-26 NOTE — Telephone Encounter (Signed)
I called the patient. The carbamazepine dose is in the low toxic range, cannot go any higher with the dose. The patient has been placed on Topamax, she has not started the medication yet. She is to get on Topamax now.

## 2016-11-23 ENCOUNTER — Other Ambulatory Visit: Payer: Self-pay | Admitting: Family Medicine

## 2016-12-26 ENCOUNTER — Other Ambulatory Visit: Payer: Self-pay | Admitting: Family Medicine

## 2017-01-03 ENCOUNTER — Ambulatory Visit (INDEPENDENT_AMBULATORY_CARE_PROVIDER_SITE_OTHER): Payer: Medicare Other | Admitting: Adult Health

## 2017-01-03 ENCOUNTER — Encounter (INDEPENDENT_AMBULATORY_CARE_PROVIDER_SITE_OTHER): Payer: Self-pay

## 2017-01-03 ENCOUNTER — Encounter: Payer: Self-pay | Admitting: Adult Health

## 2017-01-03 VITALS — BP 168/78 | HR 75 | Ht 63.5 in | Wt 143.8 lb

## 2017-01-03 DIAGNOSIS — G501 Atypical facial pain: Secondary | ICD-10-CM

## 2017-01-03 DIAGNOSIS — Z5181 Encounter for therapeutic drug level monitoring: Secondary | ICD-10-CM | POA: Diagnosis not present

## 2017-01-03 DIAGNOSIS — G500 Trigeminal neuralgia: Secondary | ICD-10-CM

## 2017-01-03 MED ORDER — TOPIRAMATE 25 MG PO TABS: 50.0000 mg | ORAL_TABLET | Freq: Two times a day (BID) | ORAL | 5 refills | Status: DC

## 2017-01-03 NOTE — Patient Instructions (Signed)
Continue Carbamazepine Blood work today Increase Topamax to 50 mg twice a day If symptoms do not improve please let us know  Topiramate tablets What is this medicine? TOPIRAMATE (toe PYRE a mate) is used to treat seizures in adults or children with epilepsy. It is also used for the prevention of migraine headaches. This medicine may be used for other purposes; ask your health care provider or pharmacist if you have questions. COMMON BRAND NAME(S): Topamax, Topiragen What should I tell my health care provider before I take this medicine? They need to know if you have any of these conditions: -bleeding disorders -cirrhosis of the liver or liver disease -diarrhea -glaucoma -kidney stones or kidney disease -low blood counts, like low white cell, platelet, or red cell counts -lung disease like asthma, obstructive pulmonary disease, emphysema -metabolic acidosis -on a ketogenic diet -schedule for surgery or a procedure -suicidal thoughts, plans, or attempt; a previous suicide attempt by you or a family member -an unusual or allergic reaction to topiramate, other medicines, foods, dyes, or preservatives -pregnant or trying to get pregnant -breast-feeding How should I use this medicine? Take this medicine by mouth with a glass of water. Follow the directions on the prescription label. Do not crush or chew. You may take this medicine with meals. Take your medicine at regular intervals. Do not take it more often than directed. Talk to your pediatrician regarding the use of this medicine in children. Special care may be needed. While this drug may be prescribed for children as young as 7 years of age for selected conditions, precautions do apply. Overdosage: If you think you have taken too much of this medicine contact a poison control center or emergency room at once. NOTE: This medicine is only for you. Do not share this medicine with others. What if I miss a dose? If you miss a dose, take it  as soon as you can. If your next dose is to be taken in less than 6 hours, then do not take the missed dose. Take the next dose at your regular time. Do not take double or extra doses. What may interact with this medicine? Do not take this medicine with any of the following medications: -probenecid This medicine may also interact with the following medications: -acetazolamide -alcohol -amitriptyline -aspirin and aspirin-like medicines -birth control pills -certain medicines for depression -certain medicines for seizures -certain medicines that treat or prevent blood clots like warfarin, enoxaparin, dalteparin, apixaban, dabigatran, and rivaroxaban -digoxin -hydrochlorothiazide -lithium -medicines for pain, sleep, or muscle relaxation -metformin -methazolamide -NSAIDS, medicines for pain and inflammation, like ibuprofen or naproxen -pioglitazone -risperidone This list may not describe all possible interactions. Give your health care provider a list of all the medicines, herbs, non-prescription drugs, or dietary supplements you use. Also tell them if you smoke, drink alcohol, or use illegal drugs. Some items may interact with your medicine. What should I watch for while using this medicine? Visit your doctor or health care professional for regular checks on your progress. Do not stop taking this medicine suddenly. This increases the risk of seizures if you are using this medicine to control epilepsy. Wear a medical identification bracelet or chain to say you have epilepsy or seizures, and carry a card that lists all your medicines. This medicine can decrease sweating and increase your body temperature. Watch for signs of deceased sweating or fever, especially in children. Avoid extreme heat, hot baths, and saunas. Be careful about exercising, especially in hot weather. Contact your health  care provider right away if you notice a fever or decrease in sweating. You should drink plenty of fluids  while taking this medicine. If you have had kidney stones in the past, this will help to reduce your chances of forming kidney stones. If you have stomach pain, with nausea or vomiting and yellowing of your eyes or skin, call your doctor immediately. You may get drowsy, dizzy, or have blurred vision. Do not drive, use machinery, or do anything that needs mental alertness until you know how this medicine affects you. To reduce dizziness, do not sit or stand up quickly, especially if you are an older patient. Alcohol can increase drowsiness and dizziness. Avoid alcoholic drinks. If you notice blurred vision, eye pain, or other eye problems, seek medical attention at once for an eye exam. The use of this medicine may increase the chance of suicidal thoughts or actions. Pay special attention to how you are responding while on this medicine. Any worsening of mood, or thoughts of suicide or dying should be reported to your health care professional right away. This medicine may increase the chance of developing metabolic acidosis. If left untreated, this can cause kidney stones, bone disease, or slowed growth in children. Symptoms include breathing fast, fatigue, loss of appetite, irregular heartbeat, or loss of consciousness. Call your doctor immediately if you experience any of these side effects. Also, tell your doctor about any surgery you plan on having while taking this medicine since this may increase your risk for metabolic acidosis. Birth control pills may not work properly while you are taking this medicine. Talk to your doctor about using an extra method of birth control. Women who become pregnant while using this medicine may enroll in the [place] Pregnancy Registry by calling [id number]. This registry collects information about the safety of antiepileptic drug use during pregnancy. What side effects may I notice from receiving this medicine? Side effects that you should  report to your doctor or health care professional as soon as possible: -allergic reactions like skin rash, itching or hives, swelling of the face, lips, or tongue -decreased sweating and/or rise in body temperature -depression -difficulty breathing, fast or irregular breathing patterns -difficulty speaking -difficulty walking or controlling muscle movements -hearing impairment -redness, blistering, peeling or loosening of the skin, including inside the mouth -tingling, pain or numbness in the hands or feet -unusual bleeding or bruising -unusually weak or tired -worsening of mood, thoughts or actions of suicide or dying Side effects that usually do not require medical attention (report to your doctor or health care professional if they continue or are bothersome): -altered taste -back pain, joint or muscle aches and pains -diarrhea, or constipation -headache -loss of appetite -nausea -stomach upset, indigestion -tremors This list may not describe all possible side effects. Call your doctor for medical advice about side effects. You may report side effects to FDA at 1-800-FDA-1088. Where should I keep my medicine? Keep out of the reach of children. Store at room temperature between 15 and 30 degrees C (59 and 86 degrees F) in a tightly closed container. Protect from moisture. Throw away any unused medicine after the expiration date. NOTE: This sheet is a summary. It may not cover all possible information. If you have questions about this medicine, talk to your doctor, pharmacist, or health care provider.  2018 Elsevier/Gold Standard (2013-10-06 23:17:57)

## 2017-01-03 NOTE — Progress Notes (Signed)
I have read the note, and I agree with the clinical assessment and plan.  Jermiya Reichl KEITH   

## 2017-01-03 NOTE — Progress Notes (Signed)
PATIENT: Kathryn Young DOB: 1946-03-28  REASON FOR VISIT: follow up- atypical facial pain HISTORY FROM: patient and husband  HISTORY OF PRESENT ILLNESS: Kathryn Young is a 71 year old female with a history of trigeminal neuralgia and atypical facial pain. The patient is currently on carbamazepine and Topamax. She states initially carbamazepine controlled her discomfort. She states however in January the pain returned and she was placed on Topamax. She reports initially Topamax resolved her discomfort. She states is in the last week this has returned although the severity is less. She states she tends to notice it in the morning more often. She states when she takes her medication it does improve. She states that she notices when she is talking she will have the sharp shooting pain that starts on the right side of forehead and travels down the face. She states that during this time she will have trouble speaking. She feels that this is mainly because she does not want to exacerbate the pain so she tries not to move her mouth. She states that she does have neck discomfort that comes and goes. She reports that her initial facial pain started 10 days after going to the dentist as well as her neck discomfort. She returns today for an evaluation.  HISTORY 10/02/16: Kathryn Young is a 71 year old right-handed white female with history of migraine headache. The patient has had migraine off and on throughout her life that eventually essentially went away after her hysterectomy. The patient comes in with a new type of headache that began about 2 weeks ago. The patient had sudden onset of severe sharp jabbing electric shock type pain that began in the V1 distribution of the head, but the patient will have occasional discomfort down into the lower part of the face as well. The pain is only on the right side. The patient has noted hypersensitivity to light touch on the forehead, she indicates that the jabs of pain  may last 15-30 seconds and then go away, and recur every 30 minutes to an hour and a half. The patient thinks that talking may induce the headache pain, but she also has extreme photophobia and phonophobia that will activate the pain. The patient became hypersensitive to odors as well. She denies any nausea or vomiting, she denies any cognitive clouding with the headache. The patient has a history of vertigo, but she has not had vertigo with this event and her prior episodes of vertigo are unassociated with headache. The patient denies any numbness or weakness of the face, arms, or legs. She denies any slurred speech, difficulty swallowing, visual disturbances such as double vision or loss of vision. The patient has been placed on carbamazepine with excellent benefit, the patient has not had any pain over the last several days. She is sent to this office for an evaluation.  REVIEW OF SYSTEMS: Out of a complete 14 system review of symptoms, the patient complains only of the following symptoms, and all other reviewed systems are negative.  Light sensitivity, trouble swallowing, speech difficulty, headache  ALLERGIES: No Known Allergies  HOME MEDICATIONS: Outpatient Medications Prior to Visit  Medication Sig Dispense Refill  . calcium-vitamin D (OSCAL WITH D) 500-200 MG-UNIT tablet Take 1 tablet by mouth 2 (two) times daily. 100 tablet 11  . carbamazepine (TEGRETOL) 200 MG tablet Take 2 tablets (400 mg total) by mouth 2 (two) times daily. 120 tablet 1  . Cholecalciferol (VITAMIN D) 2000 units tablet Take one tab po qe 30 tablet 2  .  DULoxetine (CYMBALTA) 30 MG capsule TAKE (1) CAPSULE DAILY 90 capsule 0  . levocetirizine (XYZAL) 5 MG tablet TAKE 1 TABLET IN THE EVENING 30 tablet 4  . pantoprazole (PROTONIX) 40 MG tablet Take 1 tablet (40 mg total) by mouth daily. For stomach 30 tablet 11  . raloxifene (EVISTA) 60 MG tablet Take 1 tablet (60 mg total) by mouth daily. 30 tablet 0  . simvastatin (ZOCOR)  20 MG tablet TAKE 1 TABLET DAILY 90 tablet 0  . topiramate (TOPAMAX) 25 MG tablet Take 1 tablet (25 mg total) by mouth 2 (two) times daily. 60 tablet 3   No facility-administered medications prior to visit.     PAST MEDICAL HISTORY: Past Medical History:  Diagnosis Date  . Brain infection    Age 14  . Breast mass, right   . Hyperlipidemia   . Hypertension    no meds  . Meningitis    Age 43  . Neuromuscular disorder ([place])    right shoulder with burning down arm    PAST SURGICAL HISTORY: Past Surgical History:  Procedure Laterality Date  . ABDOMINAL HYSTERECTOMY  1990  . BREAST LUMPECTOMY WITH RADIOACTIVE SEED LOCALIZATION Right 03/27/2016   Procedure: RIGHT BREAST LUMPECTOMYAFTER RADIOACTIVE SEED LOCALIZATION;  Surgeon: Jackolyn Confer, MD;  Location: [facility];  Service: General;  Laterality: Right;  RIGHT BREAST LUMPECTOMYAFTER RADIOACTIVE SEED LOCALIZATION  . BREAST SURGERY Left 1981   benign cysts removed  . Kidney Stones      FAMILY HISTORY: Family History  Problem Relation Age of Onset  . Cancer Mother     Breast  . Other Father     Drowned  . Prostate cancer Paternal Grandfather   . Heart attack Paternal Grandfather   . Pneumonia Brother     SOCIAL HISTORY: Social History   Social History  . Marital status: Married    Spouse name: N/A  . Number of children: 1  . Years of education: 37   Occupational History  . Retired    Social History Main Topics  . Smoking status: Never Smoker  . Smokeless tobacco: Never Used  . Alcohol use No  . Drug use: No  . Sexual activity: Not on file     Comment: Married   Other Topics Concern  . Not on file   Social History Narrative   Lives at home w/ her husband and grandson   Right-handed   Caffeine: 2 cups of coffee and tea throughout the day      PHYSICAL EXAM  Vitals:   01/03/17 0855  BP: (!) 168/78  Pulse: 75  Weight: 143 lb 12.8 oz (65.2 kg)  Height: 5' 3.5" (1.613 m)   Body  mass index is 25.07 kg/m.  Generalized: Well developed, in no acute distress   Neurological examination  Mentation: Alert oriented to time, place, history taking. Follows all commands speech and language fluent Cranial nerve II-XII: Pupils were equal round reactive to light. Extraocular movements were full, visual field were full on confrontational test. Facial sensation and strength were normal. Uvula tongue midline. Head turning and shoulder shrug  were normal and symmetric. Motor: The motor testing reveals 5 over 5 strength of all 4 extremities. Good symmetric motor tone is noted throughout.  Sensory: Sensory testing is intact to soft touch on all 4 extremities. No evidence of extinction is noted.  Coordination: Cerebellar testing reveals good finger-nose-finger and heel-to-shin bilaterally.  Gait and station: Gait is normal. Tandem gait is normal. Romberg is  negative. No drift is seen.  Reflexes: Deep tendon reflexes are symmetric and normal bilaterally.   DIAGNOSTIC DATA (LABS, IMAGING, TESTING) - I reviewed patient records, labs, notes, testing and imaging myself where available.  Lab Results  Component Value Date   WBC 6.2 10/25/2016   HGB 12.7 03/21/2016   HCT 39.1 10/25/2016   MCV 94 10/25/2016   PLT 240 10/25/2016      Component Value Date/Time   NA 137 10/25/2016 0914   K 4.2 10/25/2016 0914   CL 96 10/25/2016 0914   CO2 28 10/25/2016 0914   GLUCOSE 110 (H) 10/25/2016 0914   GLUCOSE 100 (H) 03/21/2016 0815   BUN 11 10/25/2016 0914   CREATININE 0.75 10/25/2016 0914   CALCIUM 9.2 10/25/2016 0914   PROT 6.8 10/25/2016 0914   ALBUMIN 4.3 10/25/2016 0914   AST 14 10/25/2016 0914   ALT 11 10/25/2016 0914   ALKPHOS 98 10/25/2016 0914   BILITOT <0.2 10/25/2016 0914   GFRNONAA 81 10/25/2016 0914   GFRAA 93 10/25/2016 0914   Lab Results  Component Value Date   CHOL 188 08/21/2016   HDL 53 08/21/2016   LDLCALC 106 (H) 08/21/2016   TRIG 143 08/21/2016   CHOLHDL 3.5  08/21/2016    Lab Results  Component Value Date   VITAMINB12 428 01/26/2014   Lab Results  Component Value Date   TSH 2.280 01/26/2014      ASSESSMENT AND PLAN 71 y.o. year old female  has a past medical history of Brain infection; Breast mass, right; Hyperlipidemia; Hypertension; Meningitis; and Neuromuscular disorder ([place]). here with:  1. Atypical facial pain 2. Trigeminal neuralgia  The patient will continue on carbamazepine. I will check blood work today. We will increase Topamax to 50 mg twice a day. Advised that if her symptoms do not improve or if she is unable to tolerate Topamax she should let us know. I reviewed the side effects of Topamax with the patient and her husband. I also provided her with a handout. Patient will follow-up in 3-4 months or sooner if needed.    Ward Givens, MSN, NP-C 01/03/2017, 9:10 AM [facility] [address], [place], [place] [address] [phone]

## 2017-01-04 ENCOUNTER — Telehealth: Payer: Self-pay | Admitting: *Deleted

## 2017-01-04 LAB — COMPREHENSIVE METABOLIC PANEL
ALBUMIN: 4.7 g/dL (ref 3.5–4.8)
ALT: 11 IU/L (ref 0–32)
AST: 16 IU/L (ref 0–40)
Albumin/Globulin Ratio: 1.9 (ref 1.2–2.2)
Alkaline Phosphatase: 132 IU/L — ABNORMAL HIGH (ref 39–117)
BUN / CREAT RATIO: 10 — AB (ref 12–28)
BUN: 7 mg/dL — ABNORMAL LOW (ref 8–27)
Bilirubin Total: 0.2 mg/dL (ref 0.0–1.2)
CHLORIDE: 91 mmol/L — AB (ref 96–106)
CO2: 24 mmol/L (ref 18–29)
CREATININE: 0.7 mg/dL (ref 0.57–1.00)
Calcium: 9.6 mg/dL (ref 8.7–10.3)
GFR calc Af Amer: 102 mL/min/{1.73_m2} (ref >59)
GFR calc non Af Amer: 88 mL/min/{1.73_m2} (ref >59)
GLOBULIN, TOTAL: 2.5 g/dL (ref 1.5–4.5)
Glucose: 121 mg/dL — ABNORMAL HIGH (ref 65–99)
POTASSIUM: 4.2 mmol/L (ref 3.5–5.2)
SODIUM: 132 mmol/L — AB (ref 134–144)
Total Protein: 7.2 g/dL (ref 6.0–8.5)

## 2017-01-04 LAB — CBC WITH DIFFERENTIAL/PLATELET
BASOS ABS: 0 10*3/uL (ref 0.0–0.2)
Basos: 0 %
EOS (ABSOLUTE): 0 10*3/uL (ref 0.0–0.4)
Eos: 1 %
HEMOGLOBIN: 14.1 g/dL (ref 11.1–15.9)
Hematocrit: 38.6 % (ref 34.0–46.6)
Immature Grans (Abs): 0 10*3/uL (ref 0.0–0.1)
Immature Granulocytes: 0 %
Lymphocytes Absolute: 2 10*3/uL (ref 0.7–3.1)
Lymphs: 39 %
MCH: 33.7 pg — AB (ref 26.6–33.0)
MCHC: 36.5 g/dL — AB (ref 31.5–35.7)
MCV: 92 fL (ref 79–97)
MONOS ABS: 0.4 10*3/uL (ref 0.1–0.9)
Monocytes: 8 %
Neutrophils Absolute: 2.6 10*3/uL (ref 1.4–7.0)
Neutrophils: 52 %
PLATELETS: 297 10*3/uL (ref 150–379)
RBC: 4.18 x10E6/uL (ref 3.77–5.28)
RDW: 14.1 % (ref 12.3–15.4)
WBC: 5.1 10*3/uL (ref 3.4–10.8)

## 2017-01-04 LAB — CARBAMAZEPINE LEVEL, TOTAL: CARBAMAZEPINE LVL: 9.6 ug/mL (ref 4.0–12.0)

## 2017-01-04 NOTE — Telephone Encounter (Signed)
Per Edman Circle, NP, spoke with husband, [name], on [place] and informed him her labs are relatively unremarkable. Advised him her BUN and creatinine are slightly decreased, and this is indicator of kidney function. Also advised her sodium is slightly decreased as well. Lajean Silvius this RN will fax all blood work to primary care provider Dr Viona Gilmore. Livia Snellen. He verbalized understanding, appreciation. Labs faxed.

## 2017-01-08 ENCOUNTER — Other Ambulatory Visit: Payer: Self-pay | Admitting: Neurology

## 2017-02-06 ENCOUNTER — Telehealth: Payer: Self-pay | Admitting: Adult Health

## 2017-02-06 MED ORDER — TOPIRAMATE 25 MG PO TABS: ORAL_TABLET | ORAL | 0 refills | Status: DC

## 2017-02-06 NOTE — Telephone Encounter (Signed)
I have spoken with husband.  Pt. continues to experience facial pain.  I have confirmed she is taking Topamax 50mg  po bid.  Per MM,  have advised ok to increase to 75mg  po bid.  Husband does not think he can split the Topamax tabs, so new rx. for Topamax 25mg  bid escribed to [facility]

## 2017-02-06 NOTE — Telephone Encounter (Signed)
Pt's husband called said she is in severe pain since yesterday, sound is a trigger. He said the pain started last week increasing yesterday. Pt is unable to talk at this time. She is taking topiramate (TOPAMAX) 25 MG tablet and carbamazepine (TEGRETOL) 200 MG tablet which did help. She was prescribed hydrocodone acetaminophen by Dr Leeroy Bock for something else and wanting to know if she could take this. Please call

## 2017-02-09 ENCOUNTER — Other Ambulatory Visit: Payer: Self-pay | Admitting: Neurology

## 2017-02-09 ENCOUNTER — Telehealth: Payer: Self-pay | Admitting: Adult Health

## 2017-02-09 NOTE — Telephone Encounter (Signed)
Patients husband [name] called office in reference to patient taking Topamax 75mg .  Husband states patient continues to have facial pain and headache over right eye.  Please call

## 2017-02-10 ENCOUNTER — Telehealth: Payer: Self-pay | Admitting: Neurology

## 2017-02-10 ENCOUNTER — Other Ambulatory Visit: Payer: Self-pay | Admitting: Neurology

## 2017-02-10 DIAGNOSIS — G500 Trigeminal neuralgia: Secondary | ICD-10-CM

## 2017-02-10 MED ORDER — BACLOFEN 10 MG PO TABS: 10.0000 mg | ORAL_TABLET | Freq: Three times a day (TID) | ORAL | 3 refills | Status: DC

## 2017-02-10 NOTE — Telephone Encounter (Signed)
Patient's husband called, still experiencing severe pain and can't eat. Asked if they could increase Topiramate to 100mg  twice daily. I think that would be ok but they just increased to 75mg  bid a few days ago and it takes time for medication to work. She is on Tegretol and Topamax, ibuprofen and tylenol. Added baclofen today, discussed side effects such as sedation, fall risk, start with 1/2 pill tid and slowly increase as tolerated, do not drive. May consider radiofrequency ablation at [facility] if this is TGN. thanks

## 2017-02-12 ENCOUNTER — Other Ambulatory Visit: Payer: Self-pay

## 2017-02-12 MED ORDER — BACLOFEN 10 MG PO TABS: 10.0000 mg | ORAL_TABLET | Freq: Three times a day (TID) | ORAL | 3 refills | Status: DC

## 2017-02-12 MED ORDER — PREDNISONE 10 MG PO TABS: ORAL_TABLET | ORAL | 0 refills | Status: DC

## 2017-02-12 NOTE — Telephone Encounter (Signed)
I spoke to husband.  Pt continues with pain, maybe slightly improved with baclofen at 1/2 tab tid, if tolerated may increase to 1 tab tid.  He seemed to think she was doing ok with the baclofen.  His concern is that she has not had much to eat to drink, since last 4 days due to pain.  I asked about urine output, color, mucous membranes, etc, if dehydrated may need to go to ER for evalu and treatment.  He verbalized understanding.   Pt taking topamax 100mg  po bid, tegretol 400mg  po bid, baclofen 5mg  po tid.  Please advise.

## 2017-02-12 NOTE — Telephone Encounter (Signed)
I called the patient. I talk with the husband. The patient began having increased pain in the right V1 distribution. She has an atypical type of pain as the pain is activated by factors such as bright lights, but the patient also gets discomfort with swallowing or chewing. The patient is not able to eat and drink well because of this.  The patient is on Topamax and carbamazepine. Initially, these medications seem to help. She was just recently placed on baclofen, she continues to have discomfort.  I will try a prednisone Dosepak. If this does not help with the next 2 or 3 days, she may need to go to the hospital for IV fluids and high-dose IV steroids.  Procedure such as gamma knife procedure is less likely to help given the distribution of pain, and the features that are atypical such as photophobia and phonophobia as initiators of her discomfort.

## 2017-02-12 NOTE — Telephone Encounter (Signed)
There is another phone message documented that was sent to Dr. Jannifer Franklin this morning regarding the same issues. I will wait for him to advise.

## 2017-02-12 NOTE — Telephone Encounter (Signed)
I called the patient, talk with the husband. The patient is taking 100 mg of Topamax twice daily. Once he is out of the prescription of the 25 and 50s, he will call and we will send in a prescription for the 100 mg tablet twice daily.

## 2017-02-12 NOTE — Telephone Encounter (Signed)
Original rx printed, resent/e-scribed to pharmacy.

## 2017-02-12 NOTE — Addendum Note (Signed)
Addended by: Kathrynn Ducking on: 02/12/2017 12:51 PM   Modules accepted: Orders

## 2017-02-12 NOTE — Telephone Encounter (Signed)
Patients husband called office states wife is not doing any better.  Patient is able to speak a little bit but speech becomes slurred.   Per husband patient had half of an ensure yesterday, but patient unable to eat any solid foods.  Patient is in extreme pain.  Please call

## 2017-02-15 MED ORDER — TOPIRAMATE 50 MG PO TABS: 150.0000 mg | ORAL_TABLET | Freq: Two times a day (BID) | ORAL | 1 refills | Status: DC

## 2017-02-15 NOTE — Addendum Note (Signed)
Addended by: Kathrynn Ducking on: 02/15/2017 11:39 AM   Modules accepted: Orders

## 2017-02-15 NOTE — Telephone Encounter (Signed)
Dr Willis- please advise 

## 2017-02-15 NOTE — Telephone Encounter (Signed)
Pt's husband called said she is having pain over the rt eye, touching the roof of her mouth with her tongue causes shooting pain over the eye, noise also. She is experiencing tingling in the rt fingers when touching something. She increased baclofen to 1 tab yesterday, she doesn't think it is helping. Please call

## 2017-02-15 NOTE — Telephone Encounter (Signed)
I called patient, talk with her husband. The patient is having a very unusual headache syndrome.  The patient is having pain mainly at the right V1 distribution, appears to be consistent with the neuralgia pain, touching the roof of the mouth with the tongue may bring on pain, pain is also brought on by sounds or bright lights. The patient reports tingling sensation on her right-handed sheet touches her leg.  I will go up on the Topamax taking 150 mg twice daily. The prednisone is not helped as much as hoped, it may help for the first 24 hours.  I will set up a referral to [facility] for consideration for gamma knife procedure.  I'll get a revisit for the patient.  The patient is now on prednisone, Topamax, carbamazepine, and baclofen without benefit. She is also on Cymbalta.  The patient is not able to eat effectively, she is taking in fluids.  May try high-dose indomethacin when she comes off of the prednisone.

## 2017-02-15 NOTE — Telephone Encounter (Signed)
Tried calling to set up f/u. Can offer tomorrow at 8am or 12pm for work in visit if they call.

## 2017-02-15 NOTE — Telephone Encounter (Signed)
Called and spoke with husband. Scheduled appt for tomorrow at 12pm, check in 1130am. Husband verbalized understanding.

## 2017-02-16 ENCOUNTER — Ambulatory Visit (INDEPENDENT_AMBULATORY_CARE_PROVIDER_SITE_OTHER): Payer: Medicare Other | Admitting: Neurology

## 2017-02-16 ENCOUNTER — Encounter: Payer: Self-pay | Admitting: Neurology

## 2017-02-16 VITALS — BP 152/76 | HR 72 | Ht 63.5 in | Wt 133.5 lb

## 2017-02-16 DIAGNOSIS — G501 Atypical facial pain: Secondary | ICD-10-CM

## 2017-02-16 DIAGNOSIS — G44059 Short lasting unilateral neuralgiform headache with conjunctival injection and tearing (SUNCT), not intractable: Secondary | ICD-10-CM | POA: Diagnosis not present

## 2017-02-16 HISTORY — DX: Short lasting unilateral neuralgiform headache with conjunctival injection and tearing (SUNCT), not intractable: G44.059

## 2017-02-16 MED ORDER — HYDROMORPHONE HCL 2 MG PO TABS: 2.0000 mg | ORAL_TABLET | ORAL | 0 refills | Status: DC | PRN

## 2017-02-16 MED ORDER — LAMOTRIGINE 25 MG PO TABS: ORAL_TABLET | ORAL | 1 refills | Status: DC

## 2017-02-16 NOTE — Patient Instructions (Signed)
With the baclofen begin 1/2 tablet three times a day for one week, then stop.  We will start lamictal for the headache. Call after you have been on this medication 3 tablets twice a day for one week.  Take dilaudid if need for pain.  SUNCT headache

## 2017-02-16 NOTE — Progress Notes (Signed)
Reason for visit: SUNCT headache  Kathryn Young is an 71 y.o. female  History of present illness:  Kathryn Young is a 71 year old right-handed white female with a history of onset of a headache that began around the beginning of December 2017. The patient has sharp pains in the V1 distribution on the right, more recently the pains have converted to being a burning sensation that may spread down into the V2 distribution as well. The patient has developed extreme photophobia and phonophobia, light and sound will activate the burning pain. Touching the roof of her mouth with her tongue will also do the same thing. During the bouts of pain that last about 5 minutes, she will have a tingling sensation in the right hand when she touches something. The patient has noted that touching the forehead, even with the wind blowing on the forehead may bring on pain as well. She is having trouble with chewing and eating, she has converted to a liquid diet. Hot or cold liquids will also activate the discomfort. During the episodes of pain, she will have tearing of the right eye. She is having multiple episodes throughout the day. She initially seemed to gain benefit with carbamazepine, but this has lost its effectiveness. The patient is been placed on Topamax, she was recently increased on the dose. She was placed on baclofen within the last week by the on-call doctor. She has been on a prednisone Dosepak which initially helped for 24 hours, but no longer offers benefit. She comes to this office for further evaluation. Between the episodes of burning pain, the patient has no discomfort.   Past Medical History:  Diagnosis Date  . Brain infection    Age 34  . Breast mass, right   . Hyperlipidemia   . Hypertension    no meds  . Meningitis    Age 16  . Neuromuscular disorder ([place])    right shoulder with burning down arm    Past Surgical History:  Procedure Laterality Date  . ABDOMINAL HYSTERECTOMY  1990  .  BREAST LUMPECTOMY WITH RADIOACTIVE SEED LOCALIZATION Right 03/27/2016   Procedure: RIGHT BREAST LUMPECTOMYAFTER RADIOACTIVE SEED LOCALIZATION;  Surgeon: Jackolyn Confer, MD;  Location: [facility];  Service: General;  Laterality: Right;  RIGHT BREAST LUMPECTOMYAFTER RADIOACTIVE SEED LOCALIZATION  . BREAST SURGERY Left 1981   benign cysts removed  . Kidney Stones      Family History  Problem Relation Age of Onset  . Cancer Mother     Breast  . Other Father     Drowned  . Prostate cancer Paternal Grandfather   . Heart attack Paternal Grandfather   . Pneumonia Brother     Social history:  reports that she has never smoked. She has never used smokeless tobacco. She reports that she does not drink alcohol or use drugs.   No Known Allergies  Medications:  Prior to Admission medications   Medication Sig Start Date End Date Taking? Authorizing Provider  baclofen (LIORESAL) 10 MG tablet Take 1 tablet (10 mg total) by mouth 3 (three) times daily. Start with 1/2 tab 3x/day, increase to whole tab if no side effects. 02/12/17  Yes Melvenia Beam, MD  calcium-vitamin D (OSCAL WITH D) 500-200 MG-UNIT tablet Take 1 tablet by mouth 2 (two) times daily. 08/23/15  Yes Claretta Fraise, MD  carbamazepine (TEGRETOL) 200 MG tablet Take 2 tablets (400 mg total) by mouth 2 (two) times daily. 01/08/17  Yes Melvenia Beam,  MD  Cholecalciferol (VITAMIN D) 2000 units tablet Take one tab po qe 09/12/16  Yes Claretta Fraise, MD  DULoxetine (CYMBALTA) 30 MG capsule TAKE (1) CAPSULE DAILY 12/27/16  Yes Claretta Fraise, MD  levocetirizine (XYZAL) 5 MG tablet TAKE 1 TABLET IN THE EVENING 11/23/16  Yes Claretta Fraise, MD  pantoprazole (PROTONIX) 40 MG tablet Take 1 tablet (40 mg total) by mouth daily. For stomach 08/21/16  Yes Claretta Fraise, MD  predniSONE (DELTASONE) 10 MG tablet Begin taking 6 tablets daily, taper by one tablet every other day until off the medication. 02/12/17  Yes Kathrynn Ducking, MD  raloxifene  (EVISTA) 60 MG tablet Take 1 tablet (60 mg total) by mouth daily. 08/30/16  Yes Claretta Fraise, MD  simvastatin (ZOCOR) 20 MG tablet TAKE 1 TABLET DAILY 08/30/16  Yes Claretta Fraise, MD  topiramate (TOPAMAX) 50 MG tablet Take 3 tablets (150 mg total) by mouth 2 (two) times daily. 02/15/17  Yes Kathrynn Ducking, MD    ROS:  Out of a complete 14 system review of symptoms, the patient complains only of the following symptoms, and all other reviewed systems are negative.  Difficulty swallowing Tearing of the eye, light sensitivity Headache, numbness, speech difficulty  Blood pressure (!) 152/76, pulse 72, height 5' 3.5" (1.613 m), weight 133 lb 8 oz (60.6 kg).  Physical Exam  General: The patient is alert and cooperative at the time of the examination.  Skin: No significant peripheral edema is noted.   Neurologic Exam  Mental status: The patient is alert and oriented x 3 at the time of the examination. The patient has apparent normal recent and remote memory, with an apparently normal attention span and concentration ability.   Cranial nerves: Facial symmetry is present. Speech is normal, no aphasia or dysarthria is noted. Extraocular movements are full. Visual fields are full.  Motor: The patient has good strength in all 4 extremities.  Sensory examination: Soft touch sensation is symmetric on the face, arms, and legs.  Coordination: The patient has good finger-nose-finger and heel-to-shin bilaterally.  Gait and station: The patient has a normal gait. Tandem gait is unsteady. Romberg is negative. No drift is seen.  Reflexes: Deep tendon reflexes are symmetric.   Assessment/Plan:  1. SUNCT headache  The patient is having intractable pain. She is on Topamax, recently the dose was increased to 150 mg twice daily. The patient will go on Lamictal, the combination of Lamictal and Topamax are the treatment of choice for this type of headache. Gabapentin may be added in the future and  the carbamazepine will be tapered off once she gets up on the Lamictal. The baclofen will be tapered off, she will go to 5 mg 3 times daily for one week and then stop the drug. She will follow-up in about 6 weeks. The patient will call our office when she is up to 75 mg twice daily of the Lamictal, we will convert her to 100 mg twice daily at that time. She had been set up for a gamma knife procedure, we will cancel this as this is not indicated for this headache type.   [name] 02/16/2017 12:01 PM  [facility] [address] [place], [place] [address]  Phone [phone] Fax [phone]

## 2017-02-19 ENCOUNTER — Telehealth: Payer: Self-pay | Admitting: *Deleted

## 2017-02-19 ENCOUNTER — Telehealth: Payer: Self-pay | Admitting: Neurology

## 2017-02-19 NOTE — Telephone Encounter (Signed)
Patients husband called office in reference to baclofen (LIORESAL) 10 MG tablet.  Patients husband would like to get clarification on taking the medication.  Please call

## 2017-02-19 NOTE — Telephone Encounter (Signed)
I called the patient. The patient is to taper off of baclofen taking one half of a tablet 3 times daily for one week, then stop.

## 2017-02-19 NOTE — Telephone Encounter (Signed)
Called and spoke with Lawai from [place]. Initiated PA topiramate over the phone. Gave clinical information. PA submitted to pharmacist for review. Awaiting response. Can take 24-72 hours for review.  PA case ID#: [id number].

## 2017-02-20 NOTE — Telephone Encounter (Signed)
Received fax from [facility] requesting further clinical info for PA topiramate. Faxed completed/signed form to [phone]. Received confirmation. Awaiting response.

## 2017-02-21 NOTE — Telephone Encounter (Signed)
Received fax notification from [facility] PA topiramate 50mg  tablet approved effective until 02/21/18.   Faxed approval notice to pt pharmacy. Fax: [phone]. Received confirmation.

## 2017-02-22 DIAGNOSIS — R51 Headache: Secondary | ICD-10-CM | POA: Diagnosis not present

## 2017-02-22 DIAGNOSIS — R1310 Dysphagia, unspecified: Secondary | ICD-10-CM | POA: Diagnosis not present

## 2017-02-26 ENCOUNTER — Telehealth: Payer: Self-pay | Admitting: Family Medicine

## 2017-02-26 NOTE — Telephone Encounter (Signed)
FYI Dr Jannifer Franklin is referring pt to [facility] for SUNCT headache

## 2017-02-28 DIAGNOSIS — G500 Trigeminal neuralgia: Secondary | ICD-10-CM | POA: Diagnosis not present

## 2017-03-07 ENCOUNTER — Telehealth: Payer: Self-pay | Admitting: Neurology

## 2017-03-07 MED ORDER — LAMOTRIGINE 100 MG PO TABS: 100.0000 mg | ORAL_TABLET | Freq: Every day | ORAL | 1 refills | Status: DC

## 2017-03-07 NOTE — Telephone Encounter (Signed)
Patients husband called office in reference to patients lamoTRIgine (LAMICTAL) 25 MG tablet.  Would like to speak with Dr. Jannifer Franklin' how the patient is doing.  Please call

## 2017-03-07 NOTE — Telephone Encounter (Signed)
I called the patient. The patient is still having ongoing headaches, if anything the headaches are worsening. She has been seen through [facility].  They are planning to do a procedure in the near future. Upon review of the literature, gamma knife procedures can be used with medically refractive SUNCT headache.  The Lamictal will be increased to 100 mg twice daily. They will call in 2 weeks to see how she is doing, we will try to continue to increase the medication.

## 2017-03-07 NOTE — Addendum Note (Signed)
Addended by: Kathrynn Ducking on: 03/07/2017 01:10 PM   Modules accepted: Orders

## 2017-03-08 ENCOUNTER — Telehealth: Payer: Self-pay | Admitting: Family Medicine

## 2017-03-09 NOTE — Telephone Encounter (Signed)
In order to consider this  request, the patient will need to see a provider.

## 2017-03-09 NOTE — Telephone Encounter (Signed)
Appt made 5/29

## 2017-03-13 ENCOUNTER — Encounter: Payer: Self-pay | Admitting: Family Medicine

## 2017-03-13 ENCOUNTER — Telehealth: Payer: Self-pay | Admitting: Neurology

## 2017-03-13 ENCOUNTER — Ambulatory Visit (INDEPENDENT_AMBULATORY_CARE_PROVIDER_SITE_OTHER): Payer: Medicare Other | Admitting: Family Medicine

## 2017-03-13 VITALS — BP 141/88 | HR 64 | Temp 97.5°F | Ht 63.5 in | Wt 130.0 lb

## 2017-03-13 DIAGNOSIS — G44059 Short lasting unilateral neuralgiform headache with conjunctival injection and tearing (SUNCT), not intractable: Secondary | ICD-10-CM

## 2017-03-13 DIAGNOSIS — G500 Trigeminal neuralgia: Secondary | ICD-10-CM

## 2017-03-13 MED ORDER — HYDROMORPHONE HCL 2 MG PO TABS: 2.0000 mg | ORAL_TABLET | ORAL | 0 refills | Status: DC | PRN

## 2017-03-13 MED ORDER — LAMOTRIGINE 100 MG PO TABS: 100.0000 mg | ORAL_TABLET | Freq: Two times a day (BID) | ORAL | 1 refills | Status: DC

## 2017-03-13 NOTE — Telephone Encounter (Signed)
Called and spoke with Stu. Per phone note from Dr Jannifer Franklin on 03/07/17 he would like patient to take 100mg  tablet twice daily. Stu requested order be resent to reflect correct order. I re-sent electronically.

## 2017-03-13 NOTE — Telephone Encounter (Signed)
Stu with [facility] calling to confirm directions for medication lamoTRIgine (LAMICTAL) 100 MG tablet.

## 2017-03-13 NOTE — Addendum Note (Signed)
Addended by: Hope Pigeon on: 03/13/2017 08:42 AM   Modules accepted: Orders

## 2017-03-13 NOTE — Progress Notes (Signed)
Subjective:  Patient ID: Kathryn Young, female    DOB: 04/25/1946  Age: 71 y.o. MRN: [id number]  CC: Medication Refill (pt here today to get a refill on her Dilaudid to last her until she has her surgery next Tuesday.)   HPI Kathryn Young presents for intractable pain from trigeminal neuralgia. Plans Gamma knife procedure with neurosurgeon, Dr. Anette Guarneri of [facility] one week from today. Has severe right sided pain with photo & phonophobia.Dr. Radene Knee relates that high pitched noises in particular, as well as bright lights trigger sx. Pain hasn't responded to carbamezapine, topamax & lamotrigine. She confirms today his report that right hemicrania & fascial pain reaches 10/10. Only minimal relief with above meds, so dilaudid given to take the edge off. Dr. Radene Knee anticipates 5 years of symptom relief with gamma knife.  History Kathryn Young has a past medical history of Brain infection; Breast mass, right; Hyperlipidemia; Hypertension; Meningitis; Neuromuscular disorder ([place]); and SUNCT (short unilateral neuralgiform headache, conjunctival inj/tear) (02/16/2017).   She has a past surgical history that includes Abdominal hysterectomy (1990); Kidney Stones; Breast surgery (Left, 1981); and Breast lumpectomy with radioactive seed localization (Right, 03/27/2016).   Her family history includes Cancer in her mother; Heart attack in her paternal grandfather; Other in her father; Pneumonia in her brother; Prostate cancer in her paternal grandfather.She reports that she has never smoked. She has never used smokeless tobacco. She reports that she does not drink alcohol or use drugs.  Current Outpatient Prescriptions on File Prior to Visit  Medication Sig Dispense Refill  . baclofen (LIORESAL) 10 MG tablet Take 1 tablet (10 mg total) by mouth 3 (three) times daily. Start with 1/2 tab 3x/day, increase to whole tab if no side effects. 90 each 3  . calcium-vitamin D (OSCAL WITH D) 500-200 MG-UNIT tablet Take 1 tablet by  mouth 2 (two) times daily. 100 tablet 11  . carbamazepine (TEGRETOL) 200 MG tablet Take 2 tablets (400 mg total) by mouth 2 (two) times daily. 120 tablet 11  . Cholecalciferol (VITAMIN D) 2000 units tablet Take one tab po qe 30 tablet 2  . DULoxetine (CYMBALTA) 30 MG capsule TAKE (1) CAPSULE DAILY 90 capsule 0  . levocetirizine (XYZAL) 5 MG tablet TAKE 1 TABLET IN THE EVENING 30 tablet 4  . pantoprazole (PROTONIX) 40 MG tablet Take 1 tablet (40 mg total) by mouth daily. For stomach 30 tablet 11  . predniSONE (DELTASONE) 10 MG tablet Begin taking 6 tablets daily, taper by one tablet every other day until off the medication. 42 tablet 0  . raloxifene (EVISTA) 60 MG tablet Take 1 tablet (60 mg total) by mouth daily. 30 tablet 0  . simvastatin (ZOCOR) 20 MG tablet TAKE 1 TABLET DAILY 90 tablet 0  . topiramate (TOPAMAX) 50 MG tablet Take 3 tablets (150 mg total) by mouth 2 (two) times daily. 180 tablet 1   No current facility-administered medications on file prior to visit.     ROS Review of Systems  Constitutional: Positive for activity change (minimally active) and appetite change (poor). Negative for fever.  HENT: Negative.   Eyes: Negative for visual disturbance.  Respiratory: Negative for cough and shortness of breath.   Cardiovascular: Negative for chest pain and palpitations.  Gastrointestinal: Negative for abdominal pain, diarrhea and nausea.  Genitourinary: Negative for dysuria.  Neurological: Positive for headaches.    Objective:  BP (!) 146/82   Pulse 64   Temp 97.5 F (36.4 C) (Oral)   Ht 5' 3.5" (  1.613 m)   Wt 130 lb (59 kg)   BMI 22.67 kg/m   Physical Exam  Constitutional: She is oriented to person, place, and time. She appears well-developed and well-nourished. She appears distressed.  Wearing dark glasses in exam room. Even with lights out. Cringes with any light   HENT:  Head: Normocephalic and atraumatic.  Eyes: Conjunctivae are normal. Pupils are equal, round,  and reactive to light.  Neck: Normal range of motion. Neck supple. No thyromegaly present.  Cardiovascular: Normal rate, regular rhythm and normal heart sounds.   No murmur heard. Pulmonary/Chest: Effort normal and breath sounds normal. No respiratory distress. She has no wheezes. She has no rales.  Abdominal: Soft. Bowel sounds are normal. She exhibits no distension. There is no tenderness.  Musculoskeletal: Normal range of motion.  Lymphadenopathy:    She has no cervical adenopathy.  Neurological: She is alert and oriented to person, place, and time.  Skin: Skin is warm and dry.  Psychiatric: She has a normal mood and affect.    Assessment & Plan:   [place] was seen today for medication refill.  Diagnoses and all orders for this visit:  SUNCT (short unilateral neuralgiform headache, conjunctival inj/tear)  Trigeminal neuralgia of right side of face  Other orders -     HYDROmorphone (DILAUDID) 2 MG tablet; Take 1 tablet (2 mg total) by mouth every 4 (four) hours as needed for severe pain.   I am having Kathryn Young maintain her calcium-vitamin D, pantoprazole, raloxifene, simvastatin, Vitamin D, levocetirizine, DULoxetine, carbamazepine, predniSONE, baclofen, topiramate, lamoTRIgine, and HYDROmorphone.  Meds ordered this encounter  Medications  . HYDROmorphone (DILAUDID) 2 MG tablet    Sig: Take 1 tablet (2 mg total) by mouth every 4 (four) hours as needed for severe pain.    Dispense:  40 tablet    Refill:  0     Follow-up: after release from specialty care post procedure.  Claretta Fraise, M.D.

## 2017-03-20 DIAGNOSIS — Z510 Encounter for antineoplastic radiation therapy: Secondary | ICD-10-CM | POA: Diagnosis not present

## 2017-03-20 DIAGNOSIS — G500 Trigeminal neuralgia: Secondary | ICD-10-CM | POA: Diagnosis not present

## 2017-03-26 ENCOUNTER — Telehealth: Payer: Self-pay | Admitting: *Deleted

## 2017-03-26 NOTE — Telephone Encounter (Signed)
Called and spoke with patient. Scheduled appt for 05/09/17 at 930am. Pt requested Wednesday appt. Advised her to check in at 900am. She verbalized understanding.

## 2017-03-26 NOTE — Telephone Encounter (Signed)
I called patient. The patient had the gamma knife procedure on 03/20/2017. We will see her in about 6-8 weeks, they have set up a schedule for tapering her off of her medications, she has already stopped the Topamax, she is not having any pain.

## 2017-03-28 ENCOUNTER — Ambulatory Visit: Payer: Self-pay | Admitting: Neurology

## 2017-04-04 ENCOUNTER — Ambulatory Visit: Payer: Medicare Other | Admitting: Adult Health

## 2017-04-07 ENCOUNTER — Other Ambulatory Visit: Payer: Self-pay | Admitting: Family Medicine

## 2017-05-04 ENCOUNTER — Telehealth: Payer: Self-pay | Admitting: Neurology

## 2017-05-04 ENCOUNTER — Encounter: Payer: Self-pay | Admitting: Neurology

## 2017-05-04 NOTE — Telephone Encounter (Signed)
I tried to call the dental office, the number given is not a working number.  I will dictate a letter and fax it to the number given.

## 2017-05-04 NOTE — Telephone Encounter (Signed)
Kathryn Young with [facility]- Dr. Gildardo Pounds Smith's office called our office to get clarification on what has been done for patients headaches (treatment).  If  anything has been done to patient that will effect her in any way to where she can't have liquid anesthetic.  Does patient need any pre medication before dental cleaning or extractions. Please call [phone] fax #- [phone].

## 2017-05-09 ENCOUNTER — Encounter: Payer: Self-pay | Admitting: Neurology

## 2017-05-09 ENCOUNTER — Ambulatory Visit (INDEPENDENT_AMBULATORY_CARE_PROVIDER_SITE_OTHER): Payer: Medicare Other | Admitting: Neurology

## 2017-05-09 VITALS — BP 130/70 | HR 65 | Ht 63.5 in | Wt 125.5 lb

## 2017-05-09 DIAGNOSIS — G44059 Short lasting unilateral neuralgiform headache with conjunctival injection and tearing (SUNCT), not intractable: Secondary | ICD-10-CM | POA: Diagnosis not present

## 2017-05-09 NOTE — Progress Notes (Signed)
Reason for visit: SUNCT headache  Kathryn Young is an 71 y.o. female  History of present illness:  [name] is a 71 year old right-handed white female with a history of right-sided facial pain in the V1 and V2 distribution associated with severe discomfort with light and sound exposure. Light touch on the right face would bring on pain, simply moving her eyes would cause discomfort. The patient would have some tingling sensation on the right hand if she touches something. The patient has gone for a gamma knife procedure with excellent results, she has been able to come off of all of her medications and she does not have pain whatsoever at this time. The photophobia and phonophobia has disappeared. The patient is able to eat and swallow well. She has some problems with her gums on the right side, the patient is followed through a dentist for this issue. She may require dental procedure in the near future.  The patient is quite satisfied with the results from her gamma knife procedure.  Past Medical History:  Diagnosis Date  . Brain infection    Age 62  . Breast mass, right   . Hyperlipidemia   . Hypertension    no meds  . Meningitis    Age 14  . Neuromuscular disorder ([place])    right shoulder with burning down arm  . SUNCT (short unilateral neuralgiform headache, conjunctival inj/tear) 02/16/2017    Past Surgical History:  Procedure Laterality Date  . ABDOMINAL HYSTERECTOMY  1990  . BREAST LUMPECTOMY WITH RADIOACTIVE SEED LOCALIZATION Right 03/27/2016   Procedure: RIGHT BREAST LUMPECTOMYAFTER RADIOACTIVE SEED LOCALIZATION;  Surgeon: Jackolyn Confer, MD;  Location: [facility];  Service: General;  Laterality: Right;  RIGHT BREAST LUMPECTOMYAFTER RADIOACTIVE SEED LOCALIZATION  . BREAST SURGERY Left 1981   benign cysts removed  . Kidney Stones      Family History  Problem Relation Age of Onset  . Cancer Mother        Breast  . Other Father        Drowned  .  Prostate cancer Paternal Grandfather   . Heart attack Paternal Grandfather   . Pneumonia Brother     Social history:  reports that she has never smoked. She has never used smokeless tobacco. She reports that she does not drink alcohol or use drugs.   No Known Allergies  Medications:  Prior to Admission medications   Medication Sig Start Date End Date Taking? Authorizing Provider  baclofen (LIORESAL) 10 MG tablet Take 1 tablet (10 mg total) by mouth 3 (three) times daily. Start with 1/2 tab 3x/day, increase to whole tab if no side effects. 02/12/17   Melvenia Beam, MD  calcium-vitamin D (OSCAL WITH D) 500-200 MG-UNIT tablet Take 1 tablet by mouth 2 (two) times daily. 08/23/15   Claretta Fraise, MD  carbamazepine (TEGRETOL) 200 MG tablet Take 2 tablets (400 mg total) by mouth 2 (two) times daily. 01/08/17   Melvenia Beam, MD  Cholecalciferol (VITAMIN D) 2000 units tablet Take one tab po qe 09/12/16   Claretta Fraise, MD  DULoxetine (CYMBALTA) 30 MG capsule TAKE (1) CAPSULE DAILY 04/09/17   Claretta Fraise, MD  HYDROmorphone (DILAUDID) 2 MG tablet Take 1 tablet (2 mg total) by mouth every 4 (four) hours as needed for severe pain. 03/13/17   Claretta Fraise, MD  lamoTRIgine (LAMICTAL) 100 MG tablet Take 1 tablet (100 mg total) by mouth 2 (two) times daily. 03/13/17   Kathrynn Ducking,  MD  levocetirizine (XYZAL) 5 MG tablet TAKE 1 TABLET IN THE EVENING 11/23/16   Claretta Fraise, MD  pantoprazole (PROTONIX) 40 MG tablet Take 1 tablet (40 mg total) by mouth daily. For stomach 08/21/16   Claretta Fraise, MD  predniSONE (DELTASONE) 10 MG tablet Begin taking 6 tablets daily, taper by one tablet every other day until off the medication. 02/12/17   Kathrynn Ducking, MD  raloxifene (EVISTA) 60 MG tablet Take 1 tablet (60 mg total) by mouth daily. 08/30/16   Claretta Fraise, MD  simvastatin (ZOCOR) 20 MG tablet TAKE 1 TABLET DAILY 08/30/16   Claretta Fraise, MD  topiramate (TOPAMAX) 50 MG tablet Take 3 tablets (150  mg total) by mouth 2 (two) times daily. 02/15/17   Kathrynn Ducking, MD    ROS:  Out of a complete 14 system review of symptoms, the patient complains only of the following symptoms, and all other reviewed systems are negative.  Headache  Blood pressure 130/70, pulse 65, height 5' 3.5" (1.613 m), weight 125 lb 8 oz (56.9 kg).  Physical Exam  General: The patient is alert and cooperative at the time of the examination.  Skin: No significant peripheral edema is noted.   Neurologic Exam  Mental status: The patient is alert and oriented x 3 at the time of the examination. The patient has apparent normal recent and remote memory, with an apparently normal attention span and concentration ability.   Cranial nerves: Facial symmetry is present. Speech is normal, no aphasia or dysarthria is noted. Extraocular movements are full. Visual fields are full.  Motor: The patient has good strength in all 4 extremities.  Sensory examination: Soft touch sensation is symmetric on the face, arms, and legs. Some slight decrease in soft touch sensation is noted on the left occipital area.  Coordination: The patient has good finger-nose-finger and heel-to-shin bilaterally.  Gait and station: The patient has a normal gait. Tandem gait is normal. Romberg is negative. No drift is seen.  Reflexes: Deep tendon reflexes are symmetric.   Assessment/Plan:  1. SUNCT headache  The patient has responded very well to gamma knife therapy. She has been able to come off of all of her medications, she is to contact our office if the pain returns. Otherwise, the patient will follow-up if needed.  [name] 05/09/2017 9:43 AM  [facility] [address] [place], [place] [address]  Phone [phone] Fax [phone]

## 2017-05-12 ENCOUNTER — Other Ambulatory Visit: Payer: Self-pay | Admitting: Family Medicine

## 2017-06-22 DIAGNOSIS — L814 Other melanin hyperpigmentation: Secondary | ICD-10-CM | POA: Diagnosis not present

## 2017-06-22 DIAGNOSIS — D2261 Melanocytic nevi of right upper limb, including shoulder: Secondary | ICD-10-CM | POA: Diagnosis not present

## 2017-06-22 DIAGNOSIS — D225 Melanocytic nevi of trunk: Secondary | ICD-10-CM | POA: Diagnosis not present

## 2017-06-22 DIAGNOSIS — D1801 Hemangioma of skin and subcutaneous tissue: Secondary | ICD-10-CM | POA: Diagnosis not present

## 2017-06-22 DIAGNOSIS — L821 Other seborrheic keratosis: Secondary | ICD-10-CM | POA: Diagnosis not present

## 2017-07-10 ENCOUNTER — Other Ambulatory Visit: Payer: Self-pay | Admitting: Family Medicine

## 2017-07-11 NOTE — Telephone Encounter (Signed)
Last seen 03/13/17  Dr Livia Snellen   Last lipid 08/21/16 and last Vit D 02/18/16  37.0

## 2017-07-28 DIAGNOSIS — Z23 Encounter for immunization: Secondary | ICD-10-CM | POA: Diagnosis not present

## 2017-08-07 DIAGNOSIS — L568 Other specified acute skin changes due to ultraviolet radiation: Secondary | ICD-10-CM | POA: Diagnosis not present

## 2017-08-07 DIAGNOSIS — G500 Trigeminal neuralgia: Secondary | ICD-10-CM | POA: Diagnosis not present

## 2017-08-07 DIAGNOSIS — Z923 Personal history of irradiation: Secondary | ICD-10-CM | POA: Diagnosis not present

## 2017-09-19 DIAGNOSIS — Z923 Personal history of irradiation: Secondary | ICD-10-CM | POA: Diagnosis not present

## 2017-09-19 DIAGNOSIS — Z8669 Personal history of other diseases of the nervous system and sense organs: Secondary | ICD-10-CM | POA: Diagnosis not present

## 2017-09-19 DIAGNOSIS — Z09 Encounter for follow-up examination after completed treatment for conditions other than malignant neoplasm: Secondary | ICD-10-CM | POA: Diagnosis not present

## 2017-10-11 ENCOUNTER — Other Ambulatory Visit: Payer: Self-pay | Admitting: Family Medicine

## 2017-10-12 NOTE — Telephone Encounter (Signed)
Last seen 03/13/17  Dr Livia Snellen

## 2017-11-19 ENCOUNTER — Other Ambulatory Visit: Payer: Self-pay | Admitting: Family Medicine

## 2017-12-13 ENCOUNTER — Encounter: Payer: Self-pay | Admitting: Family Medicine

## 2017-12-13 ENCOUNTER — Ambulatory Visit (INDEPENDENT_AMBULATORY_CARE_PROVIDER_SITE_OTHER): Payer: Medicare Other | Admitting: Family Medicine

## 2017-12-13 VITALS — BP 137/73 | HR 84 | Temp 97.6°F | Ht 63.5 in | Wt 150.0 lb

## 2017-12-13 DIAGNOSIS — J0110 Acute frontal sinusitis, unspecified: Secondary | ICD-10-CM

## 2017-12-13 MED ORDER — CEFDINIR 300 MG PO CAPS: 300.0000 mg | ORAL_CAPSULE | Freq: Two times a day (BID) | ORAL | 0 refills | Status: DC

## 2017-12-13 MED ORDER — FLUTICASONE PROPIONATE 50 MCG/ACT NA SUSP: 2.0000 | Freq: Every day | NASAL | 6 refills | Status: DC

## 2017-12-13 NOTE — Progress Notes (Signed)
   HPI  Patient presents today here with sore throat and facial pressure.  Patient states she has had symptoms for about 4 days including sore throat, bilateral facial pressure in the maxillary and frontal area, dry cough, and congestion.  She denies shortness of breath she states that she is tolerating food and fluids like usual.  She is tried Alka-Seltzer and extra strength Tylenol with only mild improvement  PMH: Smoking status noted ROS: Per HPI  Objective: BP 137/73   Pulse 84   Temp 97.6 F (36.4 C) (Oral)   Ht 5' 3.5" (1.613 m)   Wt 150 lb (68 kg)   SpO2 97%   BMI 26.15 kg/m  Gen: NAD, alert, cooperative with exam HEENT: NCAT, no tenderness to palpation of bilateral maxillary or frontal sinuses CV: RRR, good S1/S2, no murmur Resp: CTABL, no wheezes, non-labored Ext: No edema, warm Neuro: Alert and oriented, No gross deficits  Assessment and plan:  #Acute frontal sinusitis Patient without absolutely clear signs of acute sinusitis, however her symptoms are very characteristic of developing sinusitis. I have discussed with her starting Flonase today and waiting 2-3 days to decide if she needs the [facility]. Return to clinic with any concerns    Meds ordered this encounter  Medications  . cefdinir (OMNICEF) 300 MG capsule    Sig: Take 1 capsule (300 mg total) by mouth 2 (two) times daily. 1 po BID    Dispense:  20 capsule    Refill:  0  . fluticasone (FLONASE) 50 MCG/ACT nasal spray    Sig: Place 2 sprays into both nostrils daily.    Dispense:  16 g    Refill:  [place], MD [facility] 12/13/2017, 11:36 AM

## 2017-12-13 NOTE — Patient Instructions (Addendum)
Great to see you!   Sinusitis, Adult Sinusitis is soreness and inflammation of your sinuses. Sinuses are hollow spaces in the bones around your face. They are located:  Around your eyes.  In the middle of your forehead.  Behind your nose.  In your cheekbones.  Your sinuses and nasal passages are lined with a stringy fluid (mucus). Mucus normally drains out of your sinuses. When your nasal tissues get inflamed or swollen, the mucus can get trapped or blocked so air cannot flow through your sinuses. This lets bacteria, viruses, and funguses grow, and that leads to infection. Follow these instructions at home: Medicines  Take, use, or apply over-the-counter and prescription medicines only as told by your doctor. These may include nasal sprays.  If you were prescribed an antibiotic medicine, take it as told by your doctor. Do not stop taking the antibiotic even if you start to feel better. Hydrate and Humidify  Drink enough water to keep your pee (urine) clear or pale yellow.  Use a cool mist humidifier to keep the humidity level in your home above 50%.  Breathe in steam for 10-15 minutes, 3-4 times a day or as told by your doctor. You can do this in the bathroom while a hot shower is running.  Try not to spend time in cool or dry air. Rest  Rest as much as possible.  Sleep with your head raised (elevated).  Make sure to get enough sleep each night. General instructions  Put a warm, moist washcloth on your face 3-4 times a day or as told by your doctor. This will help with discomfort.  Wash your hands often with soap and water. If there is no soap and water, use hand sanitizer.  Do not smoke. Avoid being around people who are smoking (secondhand smoke).  Keep all follow-up visits as told by your doctor. This is important. Contact a doctor if:  You have a fever.  Your symptoms get worse.  Your symptoms do not get better within 10 days. Get help right away if:  You  have a very bad headache.  You cannot stop throwing up (vomiting).  You have pain or swelling around your face or eyes.  You have trouble seeing.  You feel confused.  Your neck is stiff.  You have trouble breathing. This information is not intended to replace advice given to you by your health care provider. Make sure you discuss any questions you have with your health care provider. Document Released: 03/20/2008 Document Revised: 05/28/2016 Document Reviewed: 07/28/2015 Elsevier Interactive Patient Education  2018 Elsevier Inc.  

## 2017-12-19 ENCOUNTER — Ambulatory Visit (INDEPENDENT_AMBULATORY_CARE_PROVIDER_SITE_OTHER): Payer: Medicare Other | Admitting: *Deleted

## 2017-12-19 ENCOUNTER — Encounter: Payer: Self-pay | Admitting: *Deleted

## 2017-12-19 ENCOUNTER — Ambulatory Visit (INDEPENDENT_AMBULATORY_CARE_PROVIDER_SITE_OTHER): Payer: Medicare Other

## 2017-12-19 VITALS — BP 134/71 | HR 78 | Ht 63.5 in | Wt 151.0 lb

## 2017-12-19 DIAGNOSIS — Z0000 Encounter for general adult medical examination without abnormal findings: Secondary | ICD-10-CM

## 2017-12-19 DIAGNOSIS — Z780 Asymptomatic menopausal state: Secondary | ICD-10-CM

## 2017-12-19 DIAGNOSIS — M8589 Other specified disorders of bone density and structure, multiple sites: Secondary | ICD-10-CM | POA: Diagnosis not present

## 2017-12-19 NOTE — Progress Notes (Addendum)
Subjective:   Kathryn Young is a 72 y.o. female who presents for Medicare Annual (Subsequent) preventive examination.  Kathryn Young is retired from [employer].  She lives at home with her husband, son and great grandson.  She also has one granddaughter who lives next door.  Kathryn Young enjoys shopping and watching her great grandson play sports.   She also cleans her church once per week.  She had gamma knife surgery 03/2017 and since then has experienced relief from the intractable trigeminal neuralgia along with photophobia and phonophboia that she had been experiencing for several months.  Kathryn Young feels her health is better this year than it was last year due to the success of the Gamma Knife surgery and relief of her trigeminal neuralgia symptoms.  She has had one ER visit and no hospitalizations this year.  The only surgery she had this year was the [facility] surgery last June.    Review of Systems:  HEENT: Congestion       Objective:     Vitals: BP 134/71   Pulse 78   Ht 5' 3.5" (1.613 m)   Wt 151 lb (68.5 kg)   BMI 26.33 kg/m   Body mass index is 26.33 kg/m.  Advanced Directives 12/19/2017 03/27/2016 03/20/2016 04/05/2015  Does Patient Have a Medical Advance Directive? Yes Yes Yes Yes  Type of Advance Directive Living will Healthcare Power of [place] -  Does patient want to make changes to medical advance directive? (No Data) No - Patient declined - -    Tobacco Social History   Tobacco Use  Smoking Status Never Smoker  Smokeless Tobacco Never Used     Counseling given: No   Clinical Intake:     Pain Score: 0-No pain                 Past Medical History:  Diagnosis Date  . Brain infection    Age 40  . Breast mass, right   . Hyperlipidemia   . Hypertension    no meds  . Meningitis    Age 74  . Neuromuscular disorder ([place])    right shoulder with burning down arm  . SUNCT (short unilateral neuralgiform headache,  conjunctival inj/tear) 02/16/2017   Past Surgical History:  Procedure Laterality Date  . ABDOMINAL HYSTERECTOMY  1990  . BREAST LUMPECTOMY WITH RADIOACTIVE SEED LOCALIZATION Right 03/27/2016   Procedure: RIGHT BREAST LUMPECTOMYAFTER RADIOACTIVE SEED LOCALIZATION;  Surgeon: Jackolyn Confer, MD;  Location: [facility];  Service: General;  Laterality: Right;  RIGHT BREAST LUMPECTOMYAFTER RADIOACTIVE SEED LOCALIZATION  . BREAST SURGERY Left 1981   benign cysts removed  . Kidney Stones     Family History  Problem Relation Age of Onset  . Cancer Mother        Breast  . Other Father        Drowned  . Prostate cancer Paternal Grandfather   . Heart attack Paternal Grandfather   . Pneumonia Brother    Social History   Socioeconomic History  . Marital status: Married    Spouse name: None  . Number of children: 1  . Years of education: 34  . Highest education level: None  Social Needs  . Financial resource strain: None  . Food insecurity - worry: None  . Food insecurity - inability: None  . Transportation needs - medical: None  . Transportation needs - non-medical: None  Occupational History  . Occupation: Retired  Tobacco  Use  . Smoking status: Never Smoker  . Smokeless tobacco: Never Used  Substance and Sexual Activity  . Alcohol use: No  . Drug use: No  . Sexual activity: None    Comment: Married  Other Topics Concern  . None  Social History Narrative   Lives at home w/ her husband and grandson   Right-handed   Caffeine: 2 cups of coffee and tea throughout the day    Outpatient Encounter Medications as of 12/19/2017  Medication Sig  . cefdinir (OMNICEF) 300 MG capsule Take 1 capsule (300 mg total) by mouth 2 (two) times daily. 1 po BID  . Cholecalciferol (VITAMIN D) 2000 units tablet TAKE (1) CAPSULE DAILY  . DULoxetine (CYMBALTA) 30 MG capsule TAKE (1) CAPSULE DAILY  . fluticasone (FLONASE) 50 MCG/ACT nasal spray Place 2 sprays into both nostrils daily.  Marland Kitchen  levocetirizine (XYZAL) 5 MG tablet TAKE 1 TABLET IN THE EVENING  . pantoprazole (PROTONIX) 40 MG tablet TAKE (1) TABLET DAILY FOR STOMACH  . raloxifene (EVISTA) 60 MG tablet Take 1 tablet (60 mg total) by mouth daily.  . simvastatin (ZOCOR) 20 MG tablet TAKE 1 TABLET DAILY  . calcium-vitamin D (OSCAL WITH D) 500-200 MG-UNIT tablet Take 1 tablet by mouth 2 (two) times daily. (Patient not taking: Reported on 12/19/2017)   No facility-administered encounter medications on file as of 12/19/2017.     Activities of Daily Living In your present state of health, do you have any difficulty performing the following activities: 12/19/2017  Hearing? N  Vision? N  Difficulty concentrating or making decisions? N  Walking or climbing stairs? N  Dressing or bathing? N  Doing errands, shopping? N  Preparing Food and eating ? N  Using the Toilet? N  In the past six months, have you accidently leaked urine? N  Do you have problems with loss of bowel control? N  Managing your Medications? N  Managing your Finances? N  Housekeeping or managing your Housekeeping? N  Some recent data might be hidden    Patient Care Team: Claretta Fraise, MD as PCP - General (Family Medicine) Georgina Quint, MD as Referring Physician (Radiology)    Assessment:   This is a routine wellness examination for [place].  Exercise Activities and Dietary recommendations Current Exercise Habits: Home exercise routine, Type of exercise: stretching, Time (Minutes): 10, Frequency (Times/Week): 2, Weekly Exercise (Minutes/Week): 20, Intensity: Mild, Exercise limited by: neurologic condition(s)  Goals    . Exercise 3x per week (30 min per time)     Resume walking 3 times per week for 30 minutes     Patient states she was walking 5 days per week for 1 hour before suffering from debilitating trigeminal neuralgia.  The neuralgia has improved since she had a gamma knife procedure in 03/2017. She wants to gradually resume her walking regimen.     Describes a diet consisting of 3 meals per day and snacks as needed.  She states she eats at least 2-4 servings of fruits and vegetables per day.     Fall Risk Fall Risk  12/19/2017 12/13/2017 03/13/2017 09/27/2016 09/22/2016  Falls in the past year? No No No Yes Yes  Number falls in past yr: - - - 2 or more 2 or more  Injury with Fall? - - - No No   Is the patient's home free of loose throw rugs in walkways, pet beds, electrical cords, etc?   no- states she has throw rugs  Grab bars in the bathroom? no- has seat in shower, but no grab bars      Handrails on the stairs?   no - has 3 steps to get into garage that does not have handrail      Adequate lighting?   yes    Depression Screen PHQ 2/9 Scores 12/19/2017 12/13/2017 03/13/2017 09/27/2016  PHQ - 2 Score 0 0 0 0     Cognitive Function MMSE - Mini Mental State Exam 12/19/2017  Orientation to time 5  Orientation to Place 5  Registration 3  Attention/ Calculation 4  Recall 3  Language- name 2 objects 2  Language- repeat 1  Language- follow 3 step command 3  Language- read & follow direction 1  Write a sentence 1  Copy design 1  Total score 29        Immunization History  Administered Date(s) Administered  . Influenza, High Dose Seasonal PF 07/31/2016, 07/28/2017  . Influenza-Unspecified 07/25/2014, 07/25/2015  . Pneumococcal Conjugate-13 02/18/2016  . Pneumococcal Polysaccharide-23 09/16/2011  . Tdap 04/06/2010  . Zoster 11/21/2007    Qualifies for Shingles Vaccine? Yes, vaccine on backorder, patient added to list of people to contact when vaccine comes in  Screening Tests Health Maintenance  Topic Date Due  . PAP SMEAR  09/15/2013  . MAMMOGRAM  01/11/2018  . TETANUS/TDAP  04/06/2020  . COLONOSCOPY  07/07/2026  . INFLUENZA VACCINE  Completed  . DEXA SCAN  Completed  . Hepatitis C Screening  Completed  . PNA vac Low Risk Adult  Completed     Cancer Screenings: Lung: Low Dose CT Chest recommended if Age  73-80 years, 30 pack-year currently smoking OR have quit w/in 15years. Patient does qualify. Breast:  Up to date on Mammogram? Yes   Up to date of Bone Density/Dexa? Yes - done today Colorectal: yes      Plan:  Increase walking to 3 times per week for 30 minutes. Continue eating a healthy diet of mostly lean proteins, vegetables, fruits, and whole grains Review information given on Advanced Directives, and if completed please bring our office a copy to file in your chart.   I have personally reviewed and noted the following in the patient's chart:   . Medical and social history . Use of alcohol, tobacco or illicit drugs  . Current medications and supplements . Functional ability and status . Nutritional status . Physical activity . Advanced directives . List of other physicians . Hospitalizations, surgeries, and ER visits in previous 12 months . Vitals . Screenings to include cognitive, depression, and falls . Referrals and appointments  In addition, I have reviewed and discussed with patient certain preventive protocols, quality metrics, and best practice recommendations. A written personalized care plan for preventive services as well as general preventive health recommendations were provided to patient.     Eliga Arvie M, RN  12/19/2017  I have reviewed and agree with the above AWV documentation.  Claretta Fraise, M.D.  .

## 2017-12-19 NOTE — Patient Instructions (Addendum)
Please work on increasing your walking to 3 times per week for 30 minutes.  Continue eating a healthy diet of mostly lean proteins, vegetables, fruits, and whole grains  Review information given on Advanced Directives, and if you complete these please bring our office a copy to file in your chart.  I am so glad you are doing well, thank you for coming in for your Annual Wellness Visit today!!    Preventive Care 72 Years and Older, Female Preventive care refers to lifestyle choices and visits with your health care provider that can promote health and wellness. What does preventive care include?  A yearly physical exam. This is also called an annual well check.  Dental exams once or twice a year.  Routine eye exams. Ask your health care provider how often you should have your eyes checked.  Personal lifestyle choices, including: ? Daily care of your teeth and gums. ? Regular physical activity. ? Eating a healthy diet. ? Avoiding tobacco and drug use. ? Limiting alcohol use. ? Practicing safe sex. ? Taking low-dose aspirin every day. ? Taking vitamin and mineral supplements as recommended by your health care provider. What happens during an annual well check? The services and screenings done by your health care provider during your annual well check will depend on your age, overall health, lifestyle risk factors, and family history of disease. Counseling Your health care provider may ask you questions about your:  Alcohol use.  Tobacco use.  Drug use.  Emotional well-being.  Home and relationship well-being.  Sexual activity.  Eating habits.  History of falls.  Memory and ability to understand (cognition).  Work and work Statistician.  Reproductive health.  Screening You may have the following tests or measurements:  Height, weight, and BMI.  Blood pressure.  Lipid and cholesterol levels. These may be checked every 5 years, or more frequently if you are over  52 years old.  Skin check.  Lung cancer screening. You may have this screening every year starting at age 46 if you have a 30-pack-year history of smoking and currently smoke or have quit within the past 15 years.  Fecal occult blood test (FOBT) of the stool. You may have this test every year starting at age 18.  Flexible sigmoidoscopy or colonoscopy. You may have a sigmoidoscopy every 5 years or a colonoscopy every 10 years starting at age 21.  Hepatitis C blood test.  Hepatitis B blood test.  Sexually transmitted disease (STD) testing.  Diabetes screening. This is done by checking your blood sugar (glucose) after you have not eaten for a while (fasting). You may have this done every 1-3 years.  Bone density scan. This is done to screen for osteoporosis. You may have this done starting at age 52.  Mammogram. This may be done every 1-2 years. Talk to your health care provider about how often you should have regular mammograms.  Talk with your health care provider about your test results, treatment options, and if necessary, the need for more tests. Vaccines Your health care provider may recommend certain vaccines, such as:  Influenza vaccine. This is recommended every year.  Tetanus, diphtheria, and acellular pertussis (Tdap, Td) vaccine. You may need a Td booster every 10 years.  Varicella vaccine. You may need this if you have not been vaccinated.  Zoster vaccine. You may need this after age 24.  Measles, mumps, and rubella (MMR) vaccine. You may need at least one dose of MMR if you were born in  1957 or later. You may also need a second dose.  Pneumococcal 13-valent conjugate (PCV13) vaccine. One dose is recommended after age 39.  Pneumococcal polysaccharide (PPSV23) vaccine. One dose is recommended after age 57.  Meningococcal vaccine. You may need this if you have certain conditions.  Hepatitis A vaccine. You may need this if you have certain conditions or if you travel  or work in places where you may be exposed to hepatitis A.  Hepatitis B vaccine. You may need this if you have certain conditions or if you travel or work in places where you may be exposed to hepatitis B.  Haemophilus influenzae type b (Hib) vaccine. You may need this if you have certain conditions.  Talk to your health care provider about which screenings and vaccines you need and how often you need them. This information is not intended to replace advice given to you by your health care provider. Make sure you discuss any questions you have with your health care provider. Document Released: 10/29/2015 Document Revised: 06/21/2016 Document Reviewed: 08/03/2015 Elsevier Interactive Patient Education  [employer].

## 2018-01-02 ENCOUNTER — Ambulatory Visit (INDEPENDENT_AMBULATORY_CARE_PROVIDER_SITE_OTHER): Payer: Medicare Other | Admitting: Family Medicine

## 2018-01-02 ENCOUNTER — Encounter: Payer: Self-pay | Admitting: Family Medicine

## 2018-01-02 ENCOUNTER — Ambulatory Visit: Payer: Self-pay | Admitting: Family Medicine

## 2018-01-02 VITALS — BP 158/82 | HR 75 | Temp 96.8°F | Ht 63.5 in | Wt 149.0 lb

## 2018-01-02 DIAGNOSIS — G500 Trigeminal neuralgia: Secondary | ICD-10-CM | POA: Diagnosis not present

## 2018-01-02 DIAGNOSIS — R011 Cardiac murmur, unspecified: Secondary | ICD-10-CM | POA: Diagnosis not present

## 2018-01-02 DIAGNOSIS — Z0000 Encounter for general adult medical examination without abnormal findings: Secondary | ICD-10-CM | POA: Diagnosis not present

## 2018-01-02 DIAGNOSIS — M8589 Other specified disorders of bone density and structure, multiple sites: Secondary | ICD-10-CM | POA: Diagnosis not present

## 2018-01-02 DIAGNOSIS — E782 Mixed hyperlipidemia: Secondary | ICD-10-CM

## 2018-01-02 DIAGNOSIS — E559 Vitamin D deficiency, unspecified: Secondary | ICD-10-CM | POA: Diagnosis not present

## 2018-01-02 MED ORDER — DULOXETINE HCL 30 MG PO CPEP: ORAL_CAPSULE | ORAL | 0 refills | Status: DC

## 2018-01-02 MED ORDER — PANTOPRAZOLE SODIUM 40 MG PO TBEC: DELAYED_RELEASE_TABLET | ORAL | 12 refills | Status: DC

## 2018-01-02 MED ORDER — RALOXIFENE HCL 60 MG PO TABS: 60.0000 mg | ORAL_TABLET | Freq: Every day | ORAL | 12 refills | Status: DC

## 2018-01-02 MED ORDER — SIMVASTATIN 20 MG PO TABS: 20.0000 mg | ORAL_TABLET | Freq: Every day | ORAL | 12 refills | Status: DC

## 2018-01-02 MED ORDER — LEVOCETIRIZINE DIHYDROCHLORIDE 5 MG PO TABS: 5.0000 mg | ORAL_TABLET | Freq: Every evening | ORAL | 12 refills | Status: DC

## 2018-01-02 NOTE — Progress Notes (Signed)
Subjective:  Patient ID: Kathryn Young, female    DOB: [birth year]  Age: 72 y.o. MRN: [id number]  CC: Annual Exam   HPI Kathryn Young presents for CPE  Depression screen Kathryn Young 2/9 01/02/2018 12/19/2017 12/13/2017  Decreased Interest 0 0 0  Down, Depressed, Hopeless 0 0 0  PHQ - 2 Score 0 0 0    History Kathryn Young has a past medical history of Brain infection, Breast mass, right, Hyperlipidemia, Hypertension, Meningitis, Neuromuscular disorder ([place]), and SUNCT (short unilateral neuralgiform headache, conjunctival inj/tear) (02/16/2017).   Kathryn Young has a past surgical history that includes Abdominal hysterectomy (1990); Kidney Stones; Breast surgery (Left, 1981); and Breast lumpectomy with radioactive seed localization (Right, 03/27/2016).   Her family history includes Cancer in her mother; Heart attack in her paternal grandfather; Other in her father; Pneumonia in her brother; Prostate cancer in her paternal grandfather.Kathryn Young reports that Kathryn Young has never smoked. Kathryn Young has never used smokeless tobacco. Kathryn Young reports that Kathryn Young does not drink alcohol or use drugs.    ROS Review of Systems  Constitutional: Negative for activity change, appetite change, chills, diaphoresis, fatigue, fever and unexpected weight change.  HENT: Negative for congestion, ear pain, hearing loss, postnasal drip, rhinorrhea, sneezing, sore throat and trouble swallowing.   Eyes: Negative for pain and visual disturbance.  Respiratory: Negative for cough, chest tightness and shortness of breath.   Cardiovascular: Negative for chest pain and palpitations.  Gastrointestinal: Negative for abdominal pain, constipation, diarrhea, nausea and vomiting.  Endocrine: Negative for cold intolerance, heat intolerance, polydipsia, polyphagia and polyuria.  Genitourinary: Negative for dysuria, frequency and menstrual problem.  Musculoskeletal: Negative for arthralgias, joint swelling and myalgias.  Skin: Negative for rash.  Allergic/Immunologic:  Negative for environmental allergies.  Neurological: Negative for dizziness, weakness, numbness and headaches.  Psychiatric/Behavioral: Negative for agitation and dysphoric mood.    Objective:  BP (!) 158/82   Pulse 75   Temp (!) 96.8 F (36 C) (Oral)   Ht 5' 3.5" (1.613 m)   Wt 149 lb (67.6 kg)   BMI 25.98 kg/m   BP Readings from Last 3 Encounters:  01/02/18 (!) 158/82  12/19/17 134/71  12/13/17 137/73    Wt Readings from Last 3 Encounters:  01/02/18 149 lb (67.6 kg)  12/19/17 151 lb (68.5 kg)  12/13/17 150 lb (68 kg)     Physical Exam  Constitutional: Kathryn Young is oriented to person, place, and time. Kathryn Young appears well-developed and well-nourished. No distress.  HENT:  Head: Normocephalic and atraumatic.  Right Ear: External ear normal.  Left Ear: External ear normal.  Nose: Nose normal.  Mouth/Throat: Oropharynx is clear and moist.  Eyes: Pupils are equal, round, and reactive to light. Conjunctivae and EOM are normal.  Neck: Normal range of motion. Neck supple. No thyromegaly present.  Cardiovascular: Normal rate and regular rhythm.  Murmur heard. Pulmonary/Chest: Effort normal and breath sounds normal. No respiratory distress. Kathryn Young has no wheezes. Kathryn Young has no rales. Right breast exhibits no inverted nipple, no mass and no tenderness. Left breast exhibits no inverted nipple, no mass and no tenderness. Breasts are symmetrical.  Abdominal: Soft. Normal appearance and bowel sounds are normal. Kathryn Young exhibits no distension, no abdominal bruit and no mass. There is no splenomegaly or hepatomegaly. There is no tenderness. There is no tenderness at McBurney's point and negative Murphy's sign.  Musculoskeletal: Normal range of motion. Kathryn Young exhibits no edema or tenderness.  Lymphadenopathy:    Kathryn Young has no cervical adenopathy.  Neurological: Kathryn Young is alert and  oriented to person, place, and time. Kathryn Young has normal reflexes.  Skin: Skin is warm and dry. No rash noted.  Psychiatric: Kathryn Young has a  normal mood and affect. Her behavior is normal. Judgment and thought content normal.      Assessment & Plan:   Kathryn Young was seen today for annual exam.  Diagnoses and all orders for this visit:  Well adult exam -     CBC with Differential/Platelet -     CMP14+EGFR -     Lipid panel -     TSH -     VITAMIN D 25 Hydroxy (Vit-D Deficiency, Fractures)  Trigeminal neuralgia of right side of face  Mixed hyperlipidemia -     Lipid panel  Osteopenia of multiple sites -     VITAMIN D 25 Hydroxy (Vit-D Deficiency, Fractures)  Vitamin D deficiency -     VITAMIN D 25 Hydroxy (Vit-D Deficiency, Fractures)  Murmur, cardiac -     Ambulatory referral to Cardiology  Other orders -     DULoxetine (CYMBALTA) 30 MG capsule; TAKE (1) CAPSULE DAILY x 1 WEEK THEN EVERY OTHER DAY X 1 WEEK THEN DISCONTINUE. -     levocetirizine (XYZAL) 5 MG tablet; Take 1 tablet (5 mg total) by mouth every evening. -     pantoprazole (PROTONIX) 40 MG tablet; TAKE (1) TABLET DAILY FOR STOMACH -     raloxifene (EVISTA) 60 MG tablet; Take 1 tablet (60 mg total) by mouth daily. -     simvastatin (ZOCOR) 20 MG tablet; Take 1 tablet (20 mg total) by mouth daily.       I have discontinued Kathryn Young's cefdinir. I have also changed her DULoxetine, levocetirizine, and simvastatin. Additionally, I am having her maintain her calcium-vitamin D, Vitamin D, fluticasone, pantoprazole, and raloxifene.  Allergies as of 01/02/2018   No Known Allergies     Medication List        Accurate as of 01/02/18 11:59 PM. Always use your most recent med list.          calcium-vitamin D 500-200 MG-UNIT tablet Commonly known as:  OSCAL WITH D Take 1 tablet by mouth 2 (two) times daily.   DULoxetine 30 MG capsule Commonly known as:  CYMBALTA TAKE (1) CAPSULE DAILY x 1 WEEK THEN EVERY OTHER DAY X 1 WEEK THEN DISCONTINUE.   fluticasone 50 MCG/ACT nasal spray Commonly known as:  FLONASE Place 2 sprays into both nostrils  daily.   levocetirizine 5 MG tablet Commonly known as:  XYZAL Take 1 tablet (5 mg total) by mouth every evening.   pantoprazole 40 MG tablet Commonly known as:  PROTONIX TAKE (1) TABLET DAILY FOR STOMACH   raloxifene 60 MG tablet Commonly known as:  EVISTA Take 1 tablet (60 mg total) by mouth daily.   simvastatin 20 MG tablet Commonly known as:  ZOCOR Take 1 tablet (20 mg total) by mouth daily.   Vitamin D 2000 units tablet TAKE (1) CAPSULE DAILY        Follow-up: Return in about 6 months (around 07/05/2018).  Claretta Fraise, M.D.

## 2018-01-03 LAB — TSH: TSH: 1.75 u[IU]/mL (ref 0.450–4.500)

## 2018-01-03 LAB — CMP14+EGFR
ALBUMIN: 4.6 g/dL (ref 3.5–4.8)
ALT: 11 IU/L (ref 0–32)
AST: 17 IU/L (ref 0–40)
Albumin/Globulin Ratio: 1.9 (ref 1.2–2.2)
Alkaline Phosphatase: 117 IU/L (ref 39–117)
BILIRUBIN TOTAL: 0.5 mg/dL (ref 0.0–1.2)
BUN / CREAT RATIO: 17 (ref 12–28)
BUN: 14 mg/dL (ref 8–27)
CO2: 25 mmol/L (ref 20–29)
CREATININE: 0.82 mg/dL (ref 0.57–1.00)
Calcium: 9.6 mg/dL (ref 8.7–10.3)
Chloride: 101 mmol/L (ref 96–106)
GFR, EST AFRICAN AMERICAN: 83 mL/min/{1.73_m2} (ref >59)
GFR, EST NON AFRICAN AMERICAN: 72 mL/min/{1.73_m2} (ref >59)
GLUCOSE: 102 mg/dL — AB (ref 65–99)
Globulin, Total: 2.4 g/dL (ref 1.5–4.5)
Potassium: 4.3 mmol/L (ref 3.5–5.2)
Sodium: 140 mmol/L (ref 134–144)
TOTAL PROTEIN: 7 g/dL (ref 6.0–8.5)

## 2018-01-03 LAB — CBC WITH DIFFERENTIAL/PLATELET
BASOS ABS: 0 10*3/uL (ref 0.0–0.2)
Basos: 0 %
EOS (ABSOLUTE): 0.1 10*3/uL (ref 0.0–0.4)
Eos: 1 %
HEMOGLOBIN: 13.3 g/dL (ref 11.1–15.9)
Hematocrit: 40.8 % (ref 34.0–46.6)
IMMATURE GRANS (ABS): 0 10*3/uL (ref 0.0–0.1)
Immature Granulocytes: 0 %
LYMPHS: 43 %
Lymphocytes Absolute: 2.2 10*3/uL (ref 0.7–3.1)
MCH: 30.3 pg (ref 26.6–33.0)
MCHC: 32.6 g/dL (ref 31.5–35.7)
MCV: 93 fL (ref 79–97)
MONOCYTES: 7 %
Monocytes Absolute: 0.3 10*3/uL (ref 0.1–0.9)
NEUTROS ABS: 2.4 10*3/uL (ref 1.4–7.0)
Neutrophils: 49 %
Platelets: 251 10*3/uL (ref 150–379)
RBC: 4.39 x10E6/uL (ref 3.77–5.28)
RDW: 13.4 % (ref 12.3–15.4)
WBC: 5.1 10*3/uL (ref 3.4–10.8)

## 2018-01-03 LAB — LIPID PANEL
CHOL/HDL RATIO: 2.9 ratio (ref 0.0–4.4)
Cholesterol, Total: 164 mg/dL (ref 100–199)
HDL: 57 mg/dL (ref >39)
LDL CALC: 87 mg/dL (ref 0–99)
Triglycerides: 101 mg/dL (ref 0–149)
VLDL Cholesterol Cal: 20 mg/dL (ref 5–40)

## 2018-01-03 LAB — VITAMIN D 25 HYDROXY (VIT D DEFICIENCY, FRACTURES): VIT D 25 HYDROXY: 34.6 ng/mL (ref 30.0–100.0)

## 2018-01-07 ENCOUNTER — Other Ambulatory Visit: Payer: Self-pay | Admitting: Family Medicine

## 2018-01-07 NOTE — Telephone Encounter (Signed)
Yes, one a day for a week. Then one every other day for a week.

## 2018-01-08 ENCOUNTER — Encounter: Payer: Self-pay | Admitting: Family Medicine

## 2018-01-09 NOTE — Addendum Note (Signed)
Addended by: Claretta Fraise on: 01/09/2018 03:44 PM   Modules accepted: Level of Service

## 2018-01-09 NOTE — Progress Notes (Signed)
Done. Thanks, WS 

## 2018-02-08 ENCOUNTER — Encounter: Payer: Self-pay | Admitting: Cardiology

## 2018-02-08 ENCOUNTER — Ambulatory Visit (INDEPENDENT_AMBULATORY_CARE_PROVIDER_SITE_OTHER): Payer: Medicare Other | Admitting: Cardiology

## 2018-02-08 VITALS — BP 148/86 | HR 78 | Ht 63.0 in | Wt 152.0 lb

## 2018-02-08 DIAGNOSIS — R011 Cardiac murmur, unspecified: Secondary | ICD-10-CM

## 2018-02-08 DIAGNOSIS — I10 Essential (primary) hypertension: Secondary | ICD-10-CM | POA: Diagnosis not present

## 2018-02-08 NOTE — Patient Instructions (Signed)
Medication Instructions:  Your physician recommends that you continue on your current medications as directed. Please refer to the Current Medication list given to you today.   Labwork: NONE  Testing/Procedures: Your physician has requested that you have an echocardiogram. Echocardiography is a painless test that uses sound waves to create images of your heart. It provides your doctor with information about the size and shape of your heart and how well your heart's chambers and valves are working. This procedure takes approximately one hour. There are no restrictions for this procedure.    Follow-Up: Your physician recommends that you schedule a follow-up appointment in: Pending results of test    Any Other Special Instructions Will Be Listed Below (If Applicable).     If you need a refill on your cardiac medications before your next appointment, please call your pharmacy.

## 2018-02-08 NOTE — Progress Notes (Signed)
Clinical Summary Ms. Lapier is a 72 y.o.female seen as new consult, referred by Dr Livia Snellen for heart murmur   1. Heart murmur - reports told 25 years ago had heart murmur - reports some deconditioning, but overall no significant SOB/DOE.Walks up basement stairs 2-3 times per day without troubles - no chest pain. No edema.       Past Medical History:  Diagnosis Date  . Brain infection    Age 26  . Breast mass, right   . Hyperlipidemia   . Hypertension    no meds  . Meningitis    Age 34  . Neuromuscular disorder ([place])    right shoulder with burning down arm  . SUNCT (short unilateral neuralgiform headache, conjunctival inj/tear) 02/16/2017     No Known Allergies   Current Outpatient Medications  Medication Sig Dispense Refill  . calcium-vitamin D (OSCAL WITH D) 500-200 MG-UNIT tablet Take 1 tablet by mouth 2 (two) times daily. 100 tablet 11  . Cholecalciferol (VITAMIN D) 2000 units tablet TAKE (1) CAPSULE DAILY 120 tablet 0  . DULoxetine (CYMBALTA) 30 MG capsule TAKE (1) CAPSULE DAILY x 1 WEEK THEN EVERY OTHER DAY X 1 WEEK THEN DISCONTINUE. 10 capsule 0  . DULoxetine (CYMBALTA) 30 MG capsule TAKE (1) CAPSULE DAILY 30 capsule 0  . fluticasone (FLONASE) 50 MCG/ACT nasal spray Place 2 sprays into both nostrils daily. 16 g 6  . levocetirizine (XYZAL) 5 MG tablet Take 1 tablet (5 mg total) by mouth every evening. 30 tablet 12  . pantoprazole (PROTONIX) 40 MG tablet TAKE (1) TABLET DAILY FOR STOMACH 30 tablet 12  . raloxifene (EVISTA) 60 MG tablet Take 1 tablet (60 mg total) by mouth daily. 30 tablet 12  . simvastatin (ZOCOR) 20 MG tablet Take 1 tablet (20 mg total) by mouth daily. 30 tablet 12   No current facility-administered medications for this visit.      Past Surgical History:  Procedure Laterality Date  . ABDOMINAL HYSTERECTOMY  1990  . BREAST LUMPECTOMY WITH RADIOACTIVE SEED LOCALIZATION Right 03/27/2016   Procedure: RIGHT BREAST LUMPECTOMYAFTER RADIOACTIVE  SEED LOCALIZATION;  Surgeon: Jackolyn Confer, MD;  Location: [facility];  Service: General;  Laterality: Right;  RIGHT BREAST LUMPECTOMYAFTER RADIOACTIVE SEED LOCALIZATION  . BREAST SURGERY Left 1981   benign cysts removed  . Kidney Stones       No Known Allergies    Family History  Problem Relation Age of Onset  . Cancer Mother        Breast  . Other Father        Drowned  . Prostate cancer Paternal Grandfather   . Heart attack Paternal Grandfather   . Pneumonia Brother      Social History Ms. Beidler reports that she has never smoked. She has never used smokeless tobacco. Ms. Ron reports that she does not drink alcohol.   Review of Systems CONSTITUTIONAL: No weight loss, fever, chills, weakness or fatigue.  HEENT: Eyes: No visual loss, blurred vision, double vision or yellow sclerae.No hearing loss, sneezing, congestion, runny nose or sore throat.  SKIN: No rash or itching.  CARDIOVASCULAR: per hpi RESPIRATORY: No shortness of breath, cough or sputum.  GASTROINTESTINAL: No anorexia, nausea, vomiting or diarrhea. No abdominal pain or blood.  GENITOURINARY: No burning on urination, no polyuria NEUROLOGICAL: No headache, dizziness, syncope, paralysis, ataxia, numbness or tingling in the extremities. No change in bowel or bladder control.  MUSCULOSKELETAL: No muscle, back pain, joint pain or stiffness.  LYMPHATICS: No enlarged nodes. No history of splenectomy.  PSYCHIATRIC: No history of depression or anxiety.  ENDOCRINOLOGIC: No reports of sweating, cold or heat intolerance. No polyuria or polydipsia.  Marland Kitchen   Physical Examination Vitals:   02/08/18 0858 02/08/18 0902  BP: (!) 158/90 (!) 148/86  Pulse: 78   SpO2: 97%    Vitals:   02/08/18 0858  Weight: 152 lb (68.9 kg)  Height: 5\' 3"  (1.6 m)    Gen: resting comfortably, no acute distress HEENT: no scleral icterus, pupils equal round and reactive, no palptable cervical adenopathy,  CV: RRR, 2/6  systolic murmur at apex, no jvd Resp: Clear to auscultation bilaterally GI: abdomen is soft, non-tender, non-distended, normal bowel sounds, no hepatosplenomegaly MSK: extremities are warm, no edema.  Skin: warm, no rash Neuro:  no focal deficits Psych: appropriate affect      Assessment and Plan  1. Heart murmur - exam suggesting of mitral regurgitation. We will obtain an echo to further evalute etiology of her murmur and severity.   - EKG in clinic NSR, isolated PVC  2. HTN - recently has not been on medical therapy - isolated elevated bp today in clinic, continue to monitor at this time. If persistent may require medical therapy.   F/u pending echo results   Arnoldo Lenis, M.D.

## 2018-02-13 ENCOUNTER — Encounter: Payer: Self-pay | Admitting: Cardiology

## 2018-02-15 ENCOUNTER — Ambulatory Visit (HOSPITAL_COMMUNITY)
Admission: RE | Admit: 2018-02-15 | Discharge: 2018-02-15 | Disposition: A | Discharge status: Home / Self Care | Payer: Medicare Other | Source: Ambulatory Visit | Attending: Cardiology | Admitting: Cardiology

## 2018-02-15 DIAGNOSIS — R011 Cardiac murmur, unspecified: Secondary | ICD-10-CM | POA: Diagnosis not present

## 2018-02-15 DIAGNOSIS — I119 Hypertensive heart disease without heart failure: Secondary | ICD-10-CM | POA: Insufficient documentation

## 2018-02-15 DIAGNOSIS — I081 Rheumatic disorders of both mitral and tricuspid valves: Secondary | ICD-10-CM | POA: Diagnosis not present

## 2018-02-15 DIAGNOSIS — G500 Trigeminal neuralgia: Secondary | ICD-10-CM | POA: Insufficient documentation

## 2018-02-15 DIAGNOSIS — Z8661 Personal history of infections of the central nervous system: Secondary | ICD-10-CM | POA: Diagnosis not present

## 2018-02-15 DIAGNOSIS — E785 Hyperlipidemia, unspecified: Secondary | ICD-10-CM | POA: Insufficient documentation

## 2018-02-15 NOTE — Progress Notes (Signed)
*  PRELIMINARY RESULTS* Echocardiogram 2D Echocardiogram has been performed.  Leavy Cella 02/15/2018, 10:33 AM

## 2018-02-20 ENCOUNTER — Encounter: Payer: Self-pay | Admitting: Family Medicine

## 2018-02-20 DIAGNOSIS — I340 Nonrheumatic mitral (valve) insufficiency: Secondary | ICD-10-CM | POA: Insufficient documentation

## 2018-03-04 DIAGNOSIS — H43812 Vitreous degeneration, left eye: Secondary | ICD-10-CM | POA: Diagnosis not present

## 2018-03-04 DIAGNOSIS — H2513 Age-related nuclear cataract, bilateral: Secondary | ICD-10-CM | POA: Diagnosis not present

## 2018-03-04 DIAGNOSIS — H43813 Vitreous degeneration, bilateral: Secondary | ICD-10-CM | POA: Diagnosis not present

## 2018-03-04 DIAGNOSIS — H5203 Hypermetropia, bilateral: Secondary | ICD-10-CM | POA: Diagnosis not present

## 2018-03-19 ENCOUNTER — Other Ambulatory Visit: Payer: Self-pay | Admitting: Family Medicine

## 2018-05-22 ENCOUNTER — Other Ambulatory Visit: Payer: Self-pay | Admitting: Family Medicine

## 2018-05-22 DIAGNOSIS — Z1231 Encounter for screening mammogram for malignant neoplasm of breast: Secondary | ICD-10-CM

## 2018-06-20 ENCOUNTER — Ambulatory Visit
Admission: RE | Admit: 2018-06-20 | Discharge: 2018-06-20 | Disposition: A | Discharge status: Home / Self Care | Payer: Medicare Other | Source: Ambulatory Visit | Attending: Family Medicine | Admitting: Family Medicine

## 2018-06-20 DIAGNOSIS — Z1231 Encounter for screening mammogram for malignant neoplasm of breast: Secondary | ICD-10-CM

## 2018-07-05 ENCOUNTER — Ambulatory Visit (INDEPENDENT_AMBULATORY_CARE_PROVIDER_SITE_OTHER): Payer: Medicare Other | Admitting: Family Medicine

## 2018-07-05 ENCOUNTER — Encounter: Payer: Self-pay | Admitting: Family Medicine

## 2018-07-05 ENCOUNTER — Encounter (INDEPENDENT_AMBULATORY_CARE_PROVIDER_SITE_OTHER): Payer: Self-pay

## 2018-07-05 VITALS — BP 160/79 | HR 65 | Temp 97.1°F | Ht 63.0 in | Wt 146.0 lb

## 2018-07-05 DIAGNOSIS — G500 Trigeminal neuralgia: Secondary | ICD-10-CM

## 2018-07-05 DIAGNOSIS — E782 Mixed hyperlipidemia: Secondary | ICD-10-CM | POA: Diagnosis not present

## 2018-07-05 MED ORDER — FLUTICASONE PROPIONATE 50 MCG/ACT NA SUSP: 2.0000 | Freq: Every day | NASAL | 6 refills | Status: DC

## 2018-07-05 MED ORDER — DULOXETINE HCL 30 MG PO CPEP: ORAL_CAPSULE | ORAL | 1 refills | Status: DC

## 2018-07-05 NOTE — Patient Instructions (Addendum)
DASH Eating Plan DASH stands for "Dietary Approaches to Stop Hypertension." The DASH eating plan is a healthy eating plan that has been shown to reduce high blood pressure (hypertension). It may also reduce your risk for type 2 diabetes, heart disease, and stroke. The DASH eating plan may also help with weight loss. What are tips for following this plan? General guidelines  Avoid eating more than 2,300 mg (milligrams) of salt (sodium) a day. If you have hypertension, you may need to reduce your sodium intake to 1,500 mg a day.  Limit alcohol intake to no more than 1 drink a day for nonpregnant women and 2 drinks a day for men. One drink equals 12 oz of beer, 5 oz of wine, or 1 oz of hard liquor.  Work with your health care provider to maintain a healthy body weight or to lose weight. Ask what an ideal weight is for you.  Get at least 30 minutes of exercise that causes your heart to beat faster (aerobic exercise) most days of the week. Activities may include walking, swimming, or biking.  Work with your health care provider or diet and nutrition specialist (dietitian) to adjust your eating plan to your individual calorie needs. Reading food labels  Check food labels for the amount of sodium per serving. Choose foods with less than 5 percent of the Daily Value of sodium. Generally, foods with less than 300 mg of sodium per serving fit into this eating plan.  To find whole grains, look for the word "whole" as the first word in the ingredient list. Shopping  Buy products labeled as "low-sodium" or "no salt added."  Buy fresh foods. Avoid canned foods and premade or frozen meals. Cooking  Avoid adding salt when cooking. Use salt-free seasonings or herbs instead of table salt or sea salt. Check with your health care provider or pharmacist before using salt substitutes.  Do not fry foods. Cook foods using healthy methods such as baking, boiling, grilling, and broiling instead.  Cook with  heart-healthy oils, such as olive, canola, soybean, or sunflower oil. Meal planning   Eat a balanced diet that includes: ? 5 or more servings of fruits and vegetables each day. At each meal, try to fill half of your plate with fruits and vegetables. ? Up to 6-8 servings of whole grains each day. ? Less than 6 oz of lean meat, poultry, or fish each day. A 3-oz serving of meat is about the same size as a deck of cards. One egg equals 1 oz. ? 2 servings of low-fat dairy each day. ? A serving of nuts, seeds, or beans 5 times each week. ? Heart-healthy fats. Healthy fats called Omega-3 fatty acids are found in foods such as flaxseeds and coldwater fish, like sardines, salmon, and mackerel.  Limit how much you eat of the following: ? Canned or prepackaged foods. ? Food that is high in trans fat, such as fried foods. ? Food that is high in saturated fat, such as fatty meat. ? Sweets, desserts, sugary drinks, and other foods with added sugar. ? Full-fat dairy products.  Do not salt foods before eating.  Try to eat at least 2 vegetarian meals each week.  Eat more home-cooked food and less restaurant, buffet, and fast food.  When eating at a restaurant, ask that your food be prepared with less salt or no salt, if possible. What foods are recommended? The items listed may not be a complete list. Talk with your dietitian about what   dietary choices are best for you. Grains Whole-grain or whole-wheat bread. Whole-grain or whole-wheat pasta. Brown rice. Oatmeal. Quinoa. Bulgur. Whole-grain and low-sodium cereals. Pita bread. Low-fat, low-sodium crackers. Whole-wheat flour tortillas. Vegetables Fresh or frozen vegetables (raw, steamed, roasted, or grilled). Low-sodium or reduced-sodium tomato and vegetable juice. Low-sodium or reduced-sodium tomato sauce and tomato paste. Low-sodium or reduced-sodium canned vegetables. Fruits All fresh, dried, or frozen fruit. Canned fruit in natural juice (without  added sugar). Meat and other protein foods Skinless chicken or turkey. Ground chicken or turkey. Pork with fat trimmed off. Fish and seafood. Egg whites. Dried beans, peas, or lentils. Unsalted nuts, nut butters, and seeds. Unsalted canned beans. Lean cuts of beef with fat trimmed off. Low-sodium, lean deli meat. Dairy Low-fat (1%) or fat-free (skim) milk. Fat-free, low-fat, or reduced-fat cheeses. Nonfat, low-sodium ricotta or cottage cheese. Low-fat or nonfat yogurt. Low-fat, low-sodium cheese. Fats and oils Soft margarine without trans fats. Vegetable oil. Low-fat, reduced-fat, or light mayonnaise and salad dressings (reduced-sodium). Canola, safflower, olive, soybean, and sunflower oils. Avocado. Seasoning and other foods Herbs. Spices. Seasoning mixes without salt. Unsalted popcorn and pretzels. Fat-free sweets. What foods are not recommended? The items listed may not be a complete list. Talk with your dietitian about what dietary choices are best for you. Grains Baked goods made with fat, such as croissants, muffins, or some breads. Dry pasta or rice meal packs. Vegetables Creamed or fried vegetables. Vegetables in a cheese sauce. Regular canned vegetables (not low-sodium or reduced-sodium). Regular canned tomato sauce and paste (not low-sodium or reduced-sodium). Regular tomato and vegetable juice (not low-sodium or reduced-sodium). Pickles. Olives. Fruits Canned fruit in a light or heavy syrup. Fried fruit. Fruit in cream or butter sauce. Meat and other protein foods Fatty cuts of meat. Ribs. Fried meat. Bacon. Sausage. Bologna and other processed lunch meats. Salami. Fatback. Hotdogs. Bratwurst. Salted nuts and seeds. Canned beans with added salt. Canned or smoked fish. Whole eggs or egg yolks. Chicken or turkey with skin. Dairy Whole or 2% milk, cream, and half-and-half. Whole or full-fat cream cheese. Whole-fat or sweetened yogurt. Full-fat cheese. Nondairy creamers. Whipped toppings.  Processed cheese and cheese spreads. Fats and oils Butter. Stick margarine. Lard. Shortening. Ghee. Bacon fat. Tropical oils, such as coconut, palm kernel, or palm oil. Seasoning and other foods Salted popcorn and pretzels. Onion salt, garlic salt, seasoned salt, table salt, and sea salt. Worcestershire sauce. Tartar sauce. Barbecue sauce. Teriyaki sauce. Soy sauce, including reduced-sodium. Steak sauce. Canned and packaged gravies. Fish sauce. Oyster sauce. Cocktail sauce. Horseradish that you find on the shelf. Ketchup. Mustard. Meat flavorings and tenderizers. Bouillon cubes. Hot sauce and Tabasco sauce. Premade or packaged marinades. Premade or packaged taco seasonings. Relishes. Regular salad dressings. Where to find more information:  National Heart, Lung, and Blood Institute: www.nhlbi.nih.gov  American Heart Association: www.heart.org Summary  The DASH eating plan is a healthy eating plan that has been shown to reduce high blood pressure (hypertension). It may also reduce your risk for type 2 diabetes, heart disease, and stroke.  With the DASH eating plan, you should limit salt (sodium) intake to 2,300 mg a day. If you have hypertension, you may need to reduce your sodium intake to 1,500 mg a day.  When on the DASH eating plan, aim to eat more fresh fruits and vegetables, whole grains, lean proteins, low-fat dairy, and heart-healthy fats.  Work with your health care provider or diet and nutrition specialist (dietitian) to adjust your eating plan to your individual   calorie needs. This information is not intended to replace advice given to you by your health care provider. Make sure you discuss any questions you have with your health care provider. Document Released: 09/21/2011 Document Revised: 09/25/2016 Document Reviewed: 09/25/2016 Elsevier Interactive Patient Education  2018 [place].    BP goal is 135/85!

## 2018-07-06 LAB — CMP14+EGFR
ALBUMIN: 4.6 g/dL (ref 3.5–4.8)
ALT: 11 IU/L (ref 0–32)
AST: 16 IU/L (ref 0–40)
Albumin/Globulin Ratio: 2.2 (ref 1.2–2.2)
Alkaline Phosphatase: 99 IU/L (ref 39–117)
BUN/Creatinine Ratio: 14 (ref 12–28)
BUN: 11 mg/dL (ref 8–27)
Bilirubin Total: 0.3 mg/dL (ref 0.0–1.2)
CO2: 24 mmol/L (ref 20–29)
Calcium: 9.2 mg/dL (ref 8.7–10.3)
Chloride: 101 mmol/L (ref 96–106)
Creatinine, Ser: 0.8 mg/dL (ref 0.57–1.00)
GFR calc non Af Amer: 74 mL/min/{1.73_m2} (ref >59)
GFR, EST AFRICAN AMERICAN: 86 mL/min/{1.73_m2} (ref >59)
GLUCOSE: 94 mg/dL (ref 65–99)
Globulin, Total: 2.1 g/dL (ref 1.5–4.5)
POTASSIUM: 4.1 mmol/L (ref 3.5–5.2)
SODIUM: 140 mmol/L (ref 134–144)
TOTAL PROTEIN: 6.7 g/dL (ref 6.0–8.5)

## 2018-07-06 LAB — LIPID PANEL
Chol/HDL Ratio: 3.2 ratio (ref 0.0–4.4)
Cholesterol, Total: 167 mg/dL (ref 100–199)
HDL: 53 mg/dL (ref >39)
LDL CALC: 85 mg/dL (ref 0–99)
TRIGLYCERIDES: 144 mg/dL (ref 0–149)
VLDL Cholesterol Cal: 29 mg/dL (ref 5–40)

## 2018-07-07 ENCOUNTER — Encounter: Payer: Self-pay | Admitting: Family Medicine

## 2018-07-07 NOTE — Progress Notes (Signed)
Subjective:  Patient ID: Kathryn Young, female    DOB: 27-May-1946  Age: 72 y.o. MRN: [id number]  CC: Medical Management of Chronic Issues   HPI DARRIA CORVERA presents for follow-up of elevated cholesterol. Doing well without complaints on current medication. Denies side effects of statin including myalgia and arthralgia and nausea. Also in today for liver function testing. Currently no chest pain, shortness of breath or other cardiovascular related symptoms noted.Right face weakness is minimal. Pain no longer present. Controlled with cymbalta  Pt. Has no problems with heartburn as long as she takes pantoprazole. Continues evista. Denies side effects. No fracture.  Depression screen [facility] 2/9 07/05/2018 01/02/2018 12/19/2017 12/13/2017 03/13/2017  Decreased Interest 0 0 0 0 0  Down, Depressed, Hopeless 0 0 0 0 0  PHQ - 2 Score 0 0 0 0 0    History Diarra has a past medical history of Brain infection, Breast mass, right, Hyperlipidemia, Hypertension, Meningitis, Neuromuscular disorder ([place]), and SUNCT (short unilateral neuralgiform headache, conjunctival inj/tear) (02/16/2017).   She has a past surgical history that includes Abdominal hysterectomy (1990); Kidney Stones; Breast surgery (Left, 1981); Breast lumpectomy with radioactive seed localization (Right, 03/27/2016); Breast lumpectomy (Right, 2017); and Breast lumpectomy (Left).   Her family history includes Breast cancer in her mother; Cancer in her mother; Heart attack in her paternal grandfather; Other in her father; Pneumonia in her brother; Prostate cancer in her paternal grandfather.She reports that she has never smoked. She has never used smokeless tobacco. She reports that she does not drink alcohol or use drugs.  Current Outpatient Medications on File Prior to Visit  Medication Sig Dispense Refill  . calcium-vitamin D (OSCAL WITH D) 500-200 MG-UNIT tablet Take 1 tablet by mouth 2 (two) times daily. 100 tablet 11  . Cholecalciferol  (VITAMIN D) 2000 units tablet TAKE (1) CAPSULE DAILY 12 tablet 0  . levocetirizine (XYZAL) 5 MG tablet Take 1 tablet (5 mg total) by mouth every evening. 30 tablet 12  . pantoprazole (PROTONIX) 40 MG tablet TAKE (1) TABLET DAILY FOR STOMACH 30 tablet 12  . raloxifene (EVISTA) 60 MG tablet Take 1 tablet (60 mg total) by mouth daily. 30 tablet 12  . simvastatin (ZOCOR) 20 MG tablet Take 1 tablet (20 mg total) by mouth daily. 30 tablet 12   No current facility-administered medications on file prior to visit.     ROS Review of Systems  Constitutional: Negative.   HENT: Negative for congestion.   Eyes: Negative for visual disturbance.  Respiratory: Negative for shortness of breath.   Cardiovascular: Negative for chest pain.  Gastrointestinal: Negative for abdominal pain, constipation, diarrhea, nausea and vomiting.  Genitourinary: Negative for difficulty urinating.  Musculoskeletal: Negative for arthralgias and myalgias.  Neurological: Negative for headaches.  Psychiatric/Behavioral: Negative for sleep disturbance.    Objective:  BP (!) 160/79   Pulse 65   Temp (!) 97.1 F (36.2 C) (Oral)   Ht _0  (1.6 m)   Wt 146 lb (66.2 kg)   BMI 25.86 kg/m   BP Readings from Last 3 Encounters:  07/05/18 (!) 160/79  02/08/18 (!) 148/86  01/02/18 (!) 158/82    Wt Readings from Last 3 Encounters:  07/05/18 146 lb (66.2 kg)  02/08/18 152 lb (68.9 kg)  01/02/18 149 lb (67.6 kg)     Physical Exam  Constitutional: She is oriented to person, place, and time. She appears well-developed and well-nourished. No distress.  HENT:  Head: Normocephalic and atraumatic.  Right Ear: External  ear normal.  Left Ear: External ear normal.  Nose: Nose normal.  Mouth/Throat: Oropharynx is clear and moist.  Eyes: Pupils are equal, round, and reactive to light. Conjunctivae and EOM are normal.  Neck: Normal range of motion. Neck supple. No thyromegaly present.  Cardiovascular: Normal rate, regular  rhythm and normal heart sounds.  No murmur heard. Pulmonary/Chest: Effort normal and breath sounds normal. No respiratory distress. She has no wheezes. She has no rales.  Abdominal: Soft. Bowel sounds are normal. She exhibits no distension. There is no tenderness.  Lymphadenopathy:    She has no cervical adenopathy.  Neurological: She is alert and oriented to person, place, and time. She has normal reflexes.  Skin: Skin is warm and dry.  Psychiatric: She has a normal mood and affect. Her behavior is normal. Judgment and thought content normal.    No results found for: HGBA1C  Lab Results  Component Value Date   WBC 5.1 01/02/2018   HGB 13.3 01/02/2018   HCT 40.8 01/02/2018   PLT 251 01/02/2018   GLUCOSE 94 07/05/2018   CHOL 167 07/05/2018   TRIG 144 07/05/2018   HDL 53 07/05/2018   LDLCALC 85 07/05/2018   ALT 11 07/05/2018   AST 16 07/05/2018   NA 140 07/05/2018   K 4.1 07/05/2018   CL 101 07/05/2018   CREATININE 0.80 07/05/2018   BUN 11 07/05/2018   CO2 24 07/05/2018   TSH 1.750 01/02/2018    Mm 3d Screen Breast Bilateral  Result Date: 06/21/2018 CLINICAL DATA:  Screening. EXAM: DIGITAL SCREENING BILATERAL MAMMOGRAM WITH TOMO AND CAD COMPARISON:  Previous exam(s). ACR Breast Density Category b: There are scattered areas of fibroglandular density. FINDINGS: There are no findings suspicious for malignancy. There are bilateral postsurgical changes. Images were processed with CAD. IMPRESSION: No mammographic evidence of malignancy. A result letter of this screening mammogram will be mailed directly to the patient. RECOMMENDATION: Screening mammogram in one year. (Code:SM-B-01Y) BI-RADS CATEGORY  1: Negative. Electronically Signed   By: Curlene Dolphin M.D.   On: 06/21/2018 09:06    Assessment & Plan:   Reilley was seen today for medical management of chronic issues.  Diagnoses and all orders for this visit:  Mixed hyperlipidemia -     CMP14+EGFR -     Lipid panel  Trigeminal  neuralgia of right side of face  Other orders -     DULoxetine (CYMBALTA) 30 MG capsule; TAKE (1) CAPSULE DAILY -     fluticasone (FLONASE) 50 MCG/ACT nasal spray; Place 2 sprays into both nostrils daily.   I am having Emilyn B. Paulson maintain her calcium-vitamin D, levocetirizine, pantoprazole, raloxifene, simvastatin, Vitamin D, DULoxetine, and fluticasone.  Meds ordered this encounter  Medications  . DULoxetine (CYMBALTA) 30 MG capsule    Sig: TAKE (1) CAPSULE DAILY    Dispense:  90 capsule    Refill:  1  . fluticasone (FLONASE) 50 MCG/ACT nasal spray    Sig: Place 2 sprays into both nostrils daily.    Dispense:  16 g    Refill:  6     Follow-up: Return in about 6 months (around 01/03/2019).  Claretta Fraise, M.D.

## 2018-07-29 IMAGING — DX DG HIP (WITH OR WITHOUT PELVIS) 2-3V*L*
3 series · 3 of 3 positions shown · non-contrast
Comparison: Right hip series of June 03, 2013

CLINICAL DATA: Bilateral lower extremity pain, daily aching.

EXAM:
DG HIP (WITH OR WITHOUT PELVIS) 2-3V LEFT; DG HIP (WITH OR WITHOUT
PELVIS) 2-3V RIGHT

[pelvis ap]
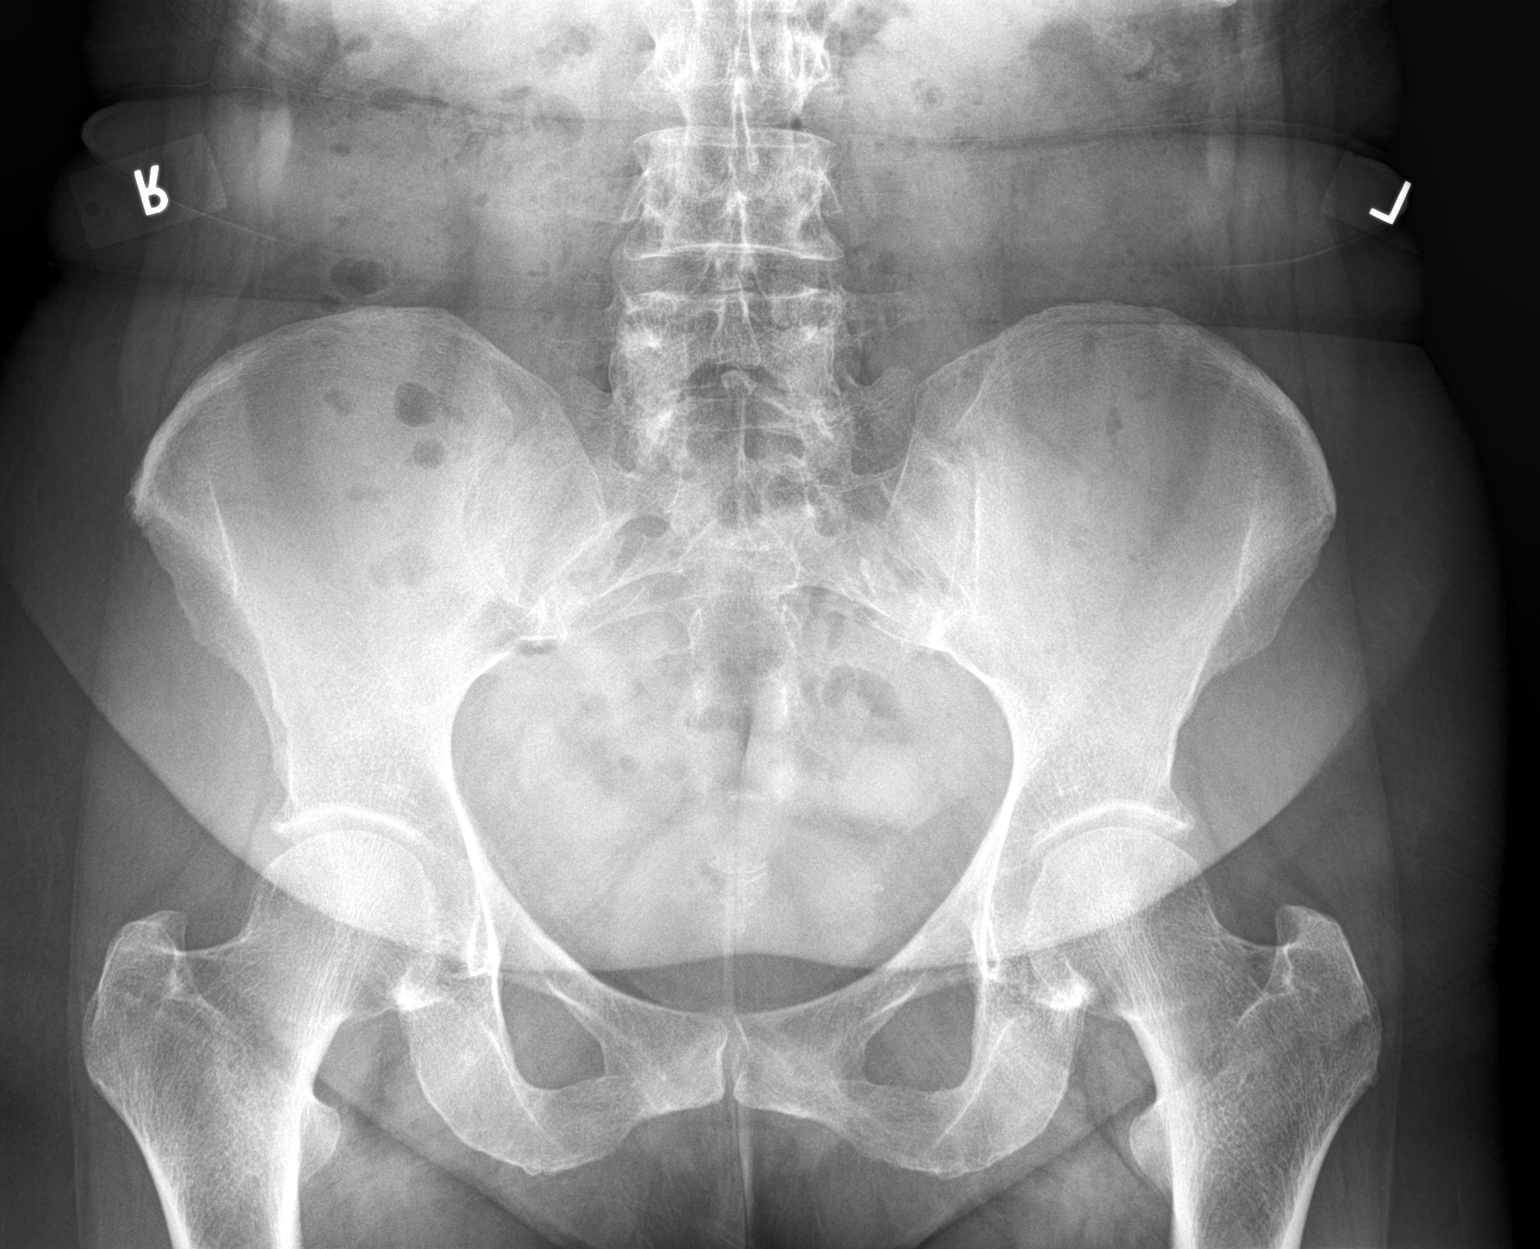

[hip ap]
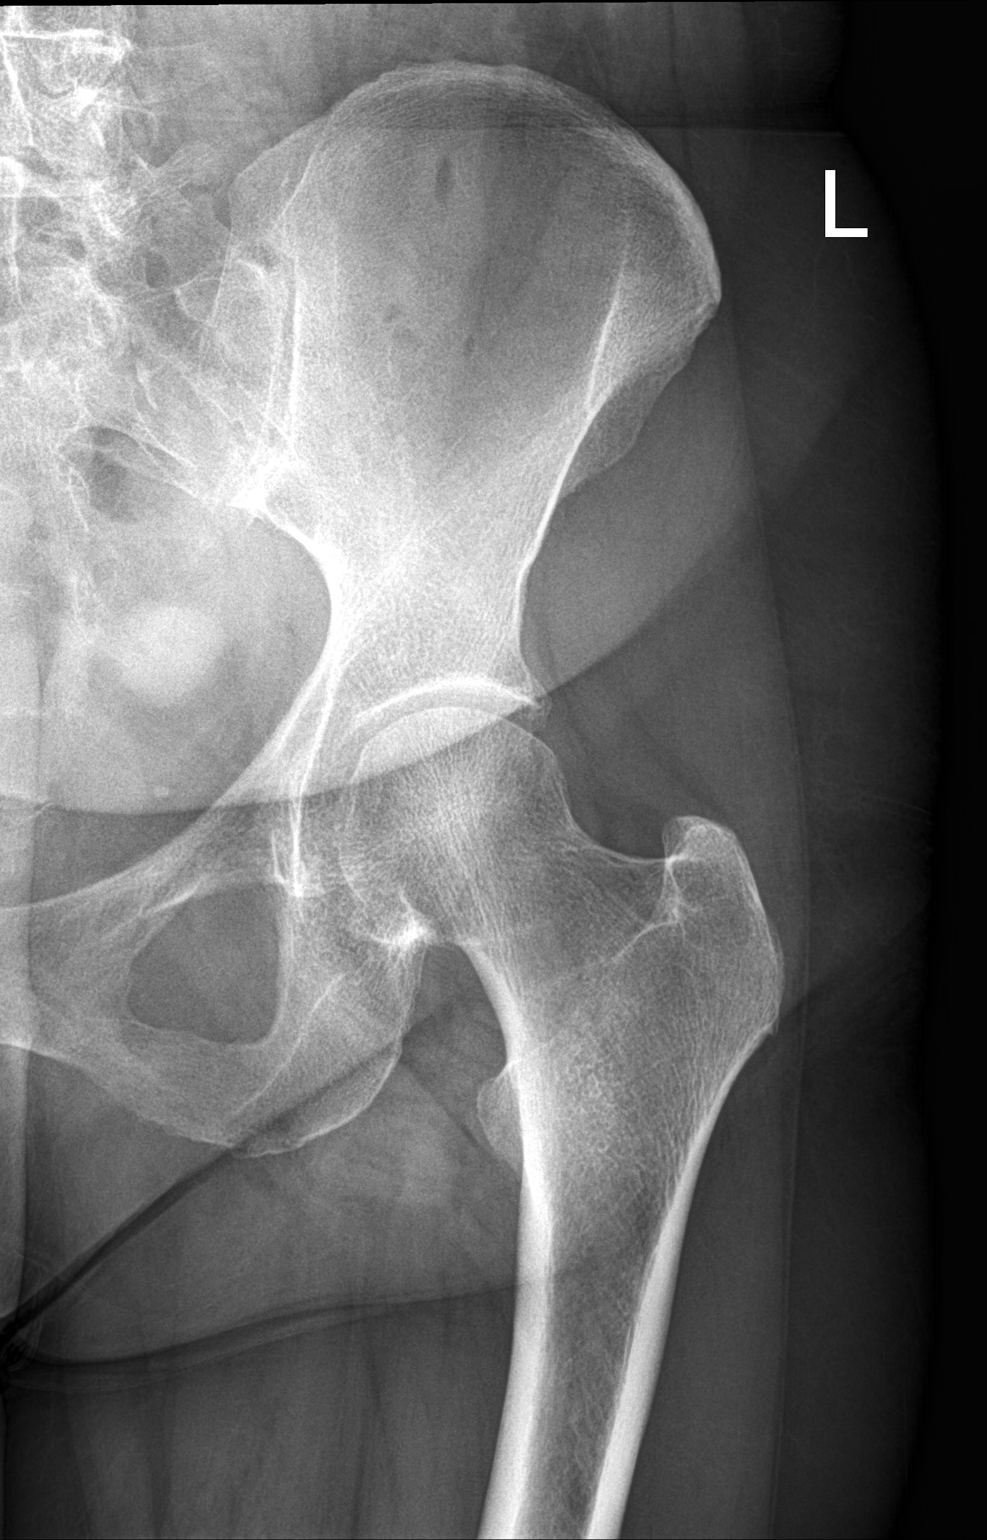

[hip lat]
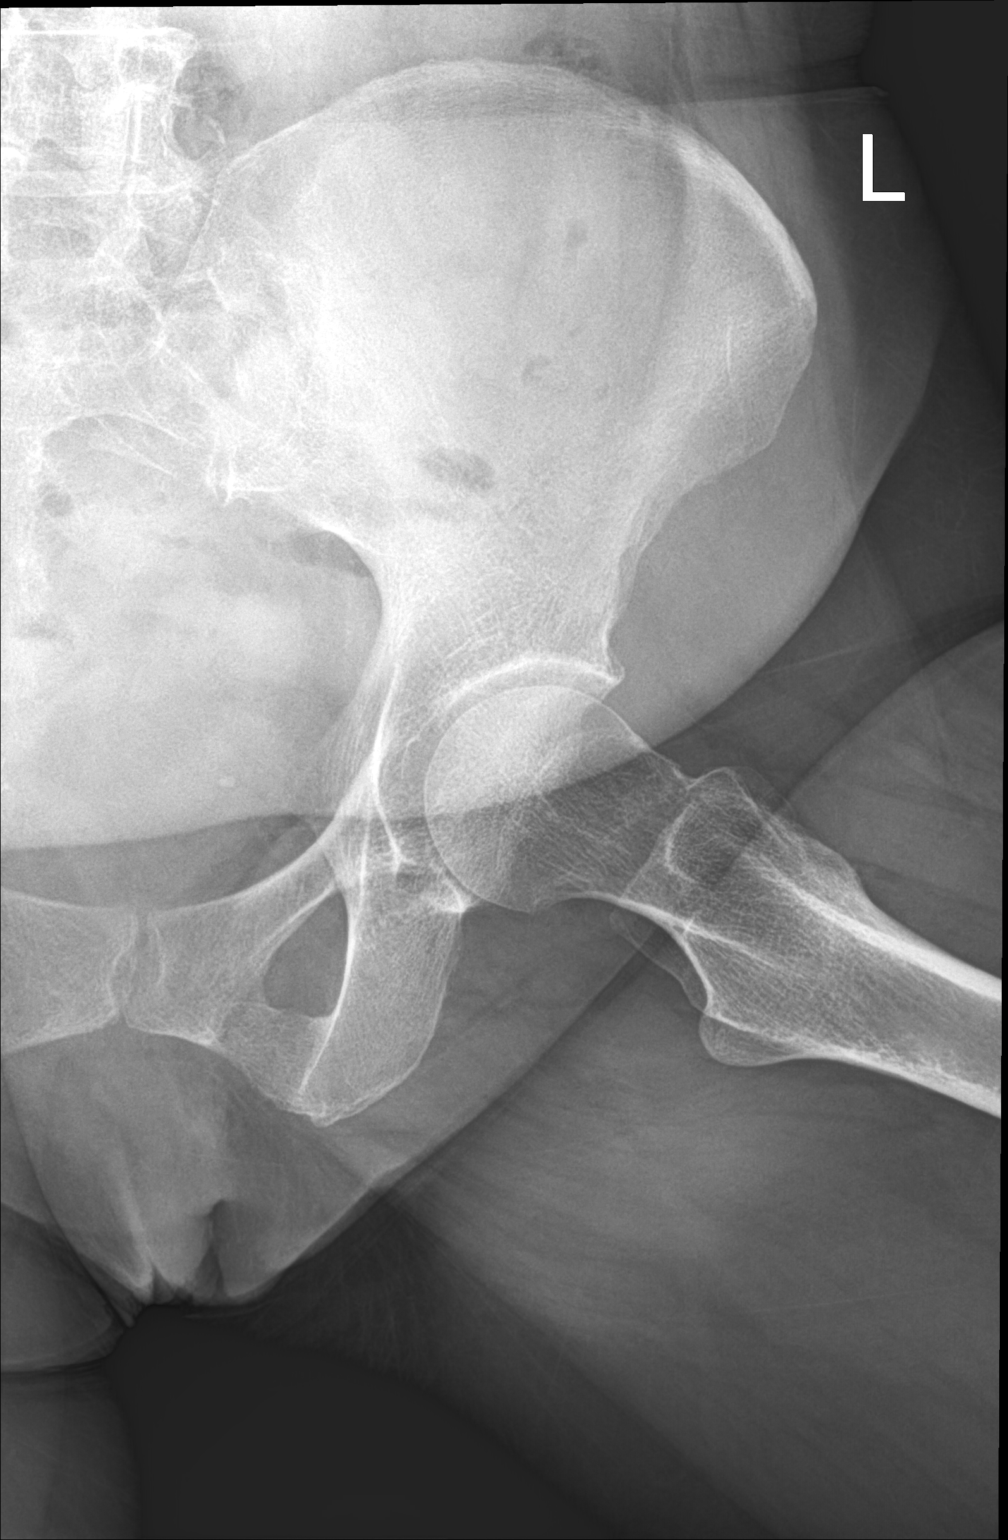

[3 of 3 positions shown; findings below may reference images not displayed]

FINDINGS: Right hip: The bones are subjectively adequately mineralized. There
is mild asymmetric narrowing of the superior row medial aspect of
the joint space. This is stable. The articular surfaces of the
femoral head and acetabulum remains smoothly rounded. The femoral
neck, intertrochanteric, and subtrochanteric regions are normal.

Left hip: The bones are subjectively adequately mineralized. There
is minimal narrowing of the superior medial aspect of the left hip
joint space similar to that on the right. The articular surfaces of
the femoral head and acetabulum remains smoothly rounded. The
femoral neck, intertrochanteric, and subtrochanteric regions are
normal.

The bony pelvis is subjectively adequately mineralized. There is no
lytic or blastic lesion or acute or old fracture.
IMPRESSION: Mild asymmetric joint space narrowing of both hips consistent with
mild osteoarthritis. There is no acute bony abnormality.

## 2018-07-29 IMAGING — DX DG KNEE 1-2V*L*
2 series · 2 of 2 positions shown · non-contrast
Comparison: No recent prior.

CLINICAL DATA: Pain both lower extremities.

EXAM:
LEFT KNEE - 1-2 VIEW

[knee ap]
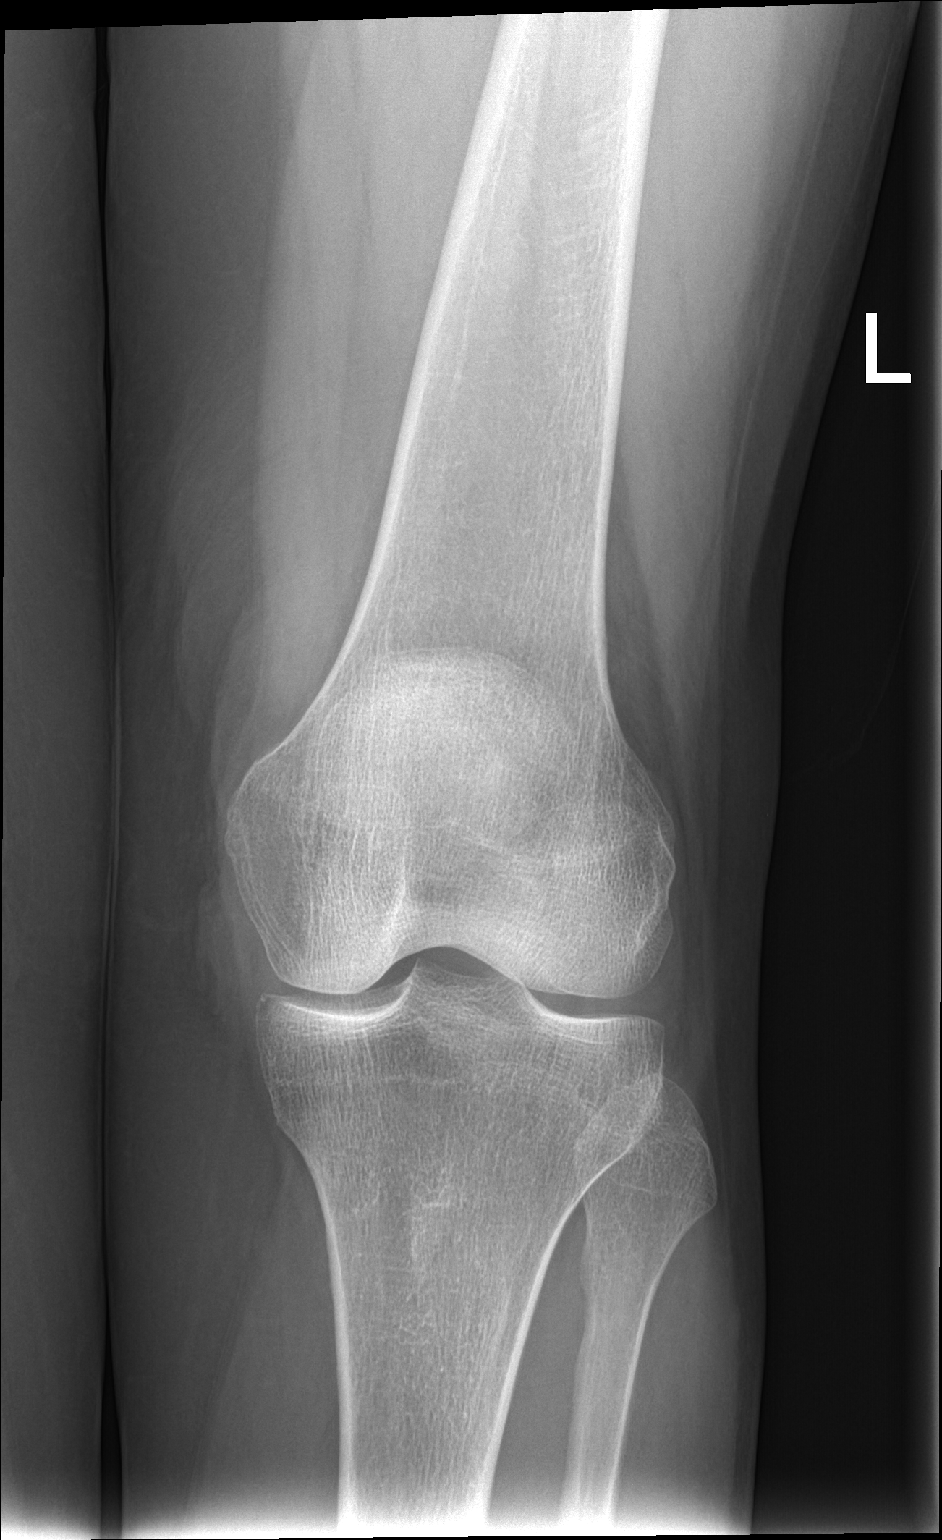

[knee lat]
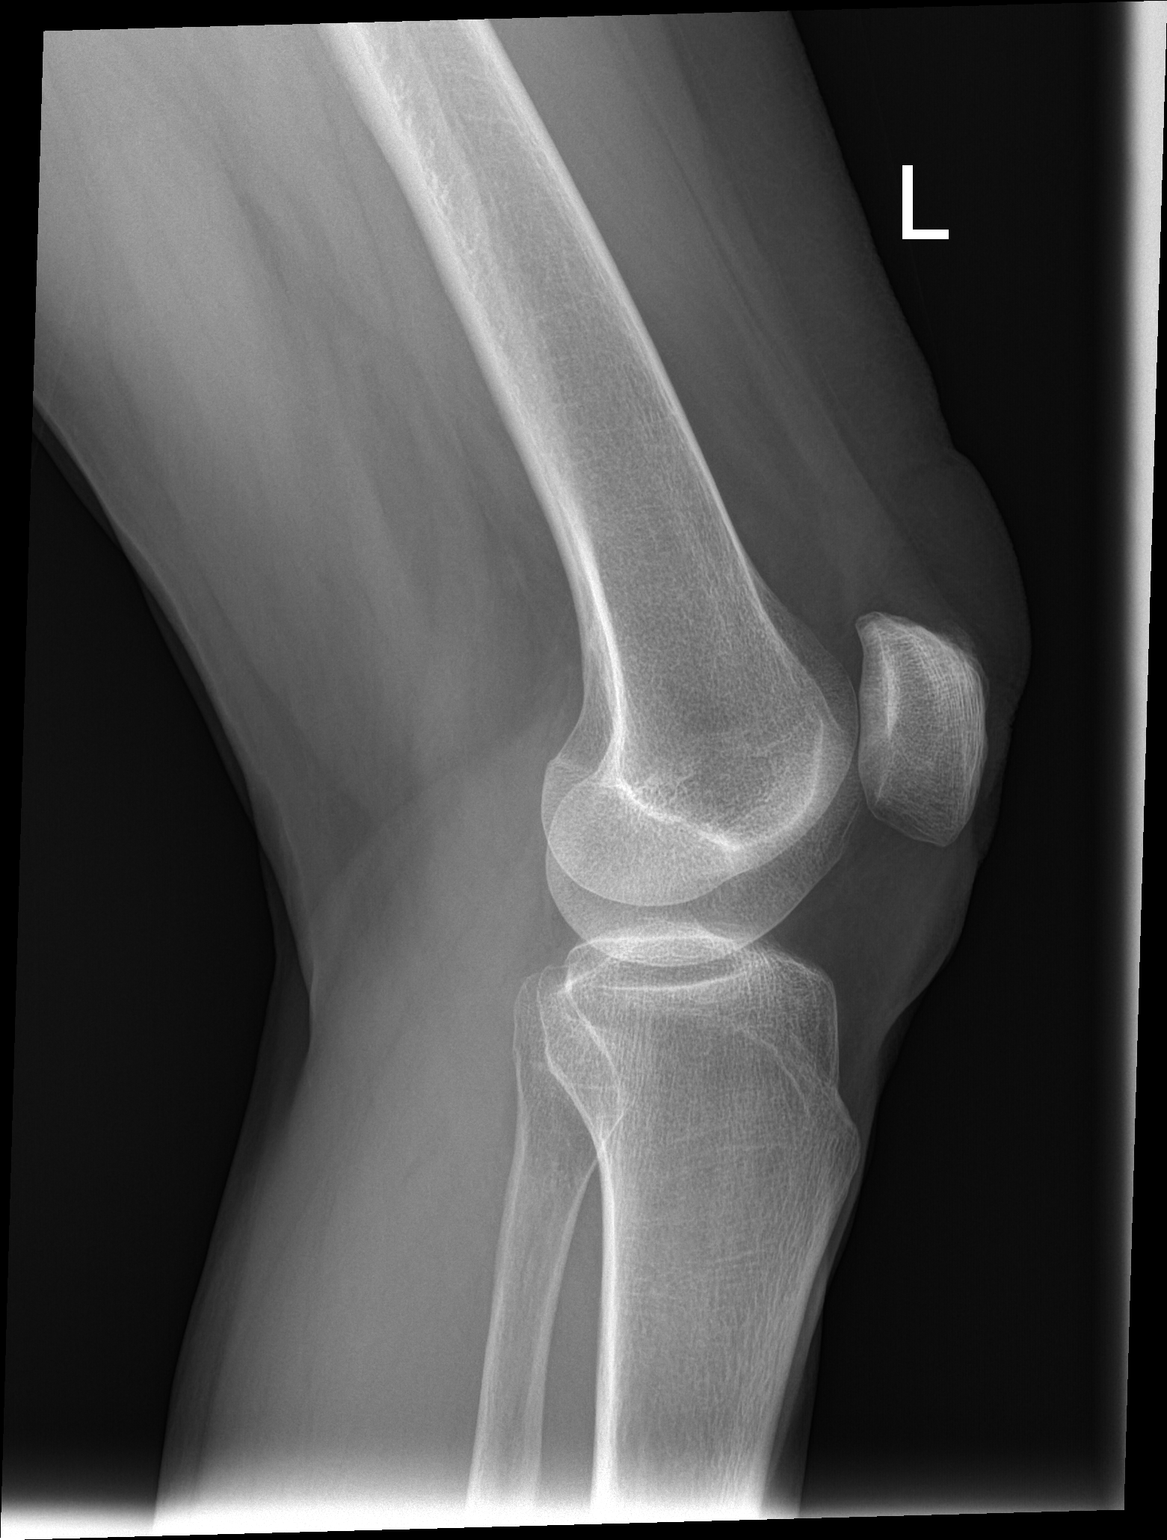

[2 of 2 positions shown; findings below may reference images not displayed]

FINDINGS: No acute bony or joint abnormality identified. No evidence fracture
dislocation. Mild medial compartment degenerative change.
IMPRESSION: Mild medial compartment degenerative change otherwise negative exam.

## 2018-07-30 DIAGNOSIS — Z23 Encounter for immunization: Secondary | ICD-10-CM | POA: Diagnosis not present

## 2018-08-05 ENCOUNTER — Other Ambulatory Visit: Payer: Self-pay | Admitting: Family Medicine

## 2018-08-06 NOTE — Telephone Encounter (Signed)
Last Vit D 01/02/18  34.6

## 2019-01-01 ENCOUNTER — Telehealth: Payer: Self-pay | Admitting: Family Medicine

## 2019-01-01 NOTE — Telephone Encounter (Signed)
Provider's nurse was notified.

## 2019-01-02 ENCOUNTER — Ambulatory Visit: Payer: Medicare Other | Admitting: Family Medicine

## 2019-01-14 ENCOUNTER — Other Ambulatory Visit: Payer: Self-pay | Admitting: Family Medicine

## 2019-02-05 ENCOUNTER — Ambulatory Visit: Payer: Medicare Other | Admitting: Cardiology

## 2019-02-19 ENCOUNTER — Other Ambulatory Visit: Payer: Self-pay | Admitting: Family Medicine

## 2019-02-20 NOTE — Telephone Encounter (Signed)
Pt scheduled with stacks 5/11 at 9:55 for TELEVISIT.

## 2019-02-20 NOTE — Telephone Encounter (Signed)
Stacks. NTBS 6 mos ckup was to be in March. 30 days approved

## 2019-02-24 ENCOUNTER — Ambulatory Visit (INDEPENDENT_AMBULATORY_CARE_PROVIDER_SITE_OTHER): Payer: Medicare Other | Admitting: Family Medicine

## 2019-02-24 ENCOUNTER — Other Ambulatory Visit: Payer: Self-pay

## 2019-02-24 ENCOUNTER — Encounter: Payer: Self-pay | Admitting: Family Medicine

## 2019-02-24 DIAGNOSIS — G500 Trigeminal neuralgia: Secondary | ICD-10-CM | POA: Diagnosis not present

## 2019-02-24 DIAGNOSIS — E782 Mixed hyperlipidemia: Secondary | ICD-10-CM | POA: Diagnosis not present

## 2019-02-24 MED ORDER — LEVOCETIRIZINE DIHYDROCHLORIDE 5 MG PO TABS: 5.0000 mg | ORAL_TABLET | Freq: Every evening | ORAL | 3 refills | Status: DC

## 2019-02-24 MED ORDER — DULOXETINE HCL 30 MG PO CPEP: 30.0000 mg | ORAL_CAPSULE | Freq: Every day | ORAL | 0 refills | Status: DC

## 2019-02-24 MED ORDER — RALOXIFENE HCL 60 MG PO TABS: 60.0000 mg | ORAL_TABLET | Freq: Every day | ORAL | 3 refills | Status: DC

## 2019-02-24 MED ORDER — SIMVASTATIN 20 MG PO TABS: 20.0000 mg | ORAL_TABLET | Freq: Every day | ORAL | 3 refills | Status: DC

## 2019-02-24 MED ORDER — PANTOPRAZOLE SODIUM 40 MG PO TBEC: DELAYED_RELEASE_TABLET | ORAL | 3 refills | Status: DC

## 2019-02-24 NOTE — Progress Notes (Signed)
Subjective:    Patient ID: Kathryn Young, female    DOB: [birth year], 73 y.o.   MRN: [id number]   HPI: Kathryn Young is a 73 y.o. female presenting for follow-up of her trigeminal neuralgia.  She is also treated for hypertension.  Blood pressure at home today was 132/64, HR 65-85. Glucose random, 122 recently. Had eaten cake.   Follow-up of trigeminal neuralgia: Notes a heart beat in her ear. Arms still itch at night. Denies HA, but right side seems to be weak after trigeminal procedure. Sight seems to be getting worse.  However, the intense pain she felt prior to the ablation procedure has not occurred again.   Follow-up of hypertension. Patient has no history of headache chest pain or shortness of breath or recent cough. Patient also denies symptoms of TIA such as numbness weakness lateralizing. Patient checks  blood pressure at home and has not had any elevated readings recently. Patient denies side effects from his medication. States taking it regularly.  Depression screen [facility] 2/9 07/05/2018 01/02/2018 12/19/2017 12/13/2017 03/13/2017  Decreased Interest 0 0 0 0 0  Down, Depressed, Hopeless 0 0 0 0 0  PHQ - 2 Score 0 0 0 0 0     Relevant past medical, surgical, family and social history reviewed and updated as indicated.  Interim medical history since our last visit reviewed. Allergies and medications reviewed and updated.  ROS:  Review of Systems   Social History   Tobacco Use  Smoking Status Never Smoker  Smokeless Tobacco Never Used       Objective:     Wt Readings from Last 3 Encounters:  07/05/18 146 lb (66.2 kg)  02/08/18 152 lb (68.9 kg)  01/02/18 149 lb (67.6 kg)     Exam deferred. Pt. Harboring due to COVID 19. Phone visit performed.   Assessment & Plan:   1. Mixed hyperlipidemia   2. Trigeminal neuralgia of right side of face     Meds ordered this encounter  Medications  . raloxifene (EVISTA) 60 MG tablet    Sig: Take 1 tablet (60 mg total) by mouth  daily.    Dispense:  90 tablet    Refill:  3  . pantoprazole (PROTONIX) 40 MG tablet    Sig: TAKE (1) TABLET DAILY FOR STOMACH    Dispense:  90 tablet    Refill:  3  . simvastatin (ZOCOR) 20 MG tablet    Sig: Take 1 tablet (20 mg total) by mouth daily.    Dispense:  90 tablet    Refill:  3  . levocetirizine (XYZAL) 5 MG tablet    Sig: Take 1 tablet (5 mg total) by mouth every evening.    Dispense:  90 tablet    Refill:  3  . DULoxetine (CYMBALTA) 30 MG capsule    Sig: Take 1 capsule (30 mg total) by mouth daily.    Dispense:  90 capsule    Refill:  0    No orders of the defined types were placed in this encounter.     Diagnoses and all orders for this visit:  Mixed hyperlipidemia  Trigeminal neuralgia of right side of face  Other orders -     raloxifene (EVISTA) 60 MG tablet; Take 1 tablet (60 mg total) by mouth daily. -     pantoprazole (PROTONIX) 40 MG tablet; TAKE (1) TABLET DAILY FOR STOMACH -     simvastatin (ZOCOR) 20 MG tablet; Take 1 tablet (20 mg  total) by mouth daily. -     levocetirizine (XYZAL) 5 MG tablet; Take 1 tablet (5 mg total) by mouth every evening. -     DULoxetine (CYMBALTA) 30 MG capsule; Take 1 capsule (30 mg total) by mouth daily.    Virtual Visit via telephone Note  I discussed the limitations, risks, security and privacy concerns of performing an evaluation and management service by telephone and the availability of in person appointments. The patient was identified with two identifiers. Pt.expressed understanding and agreed to proceed. Pt. Is at home. Dr. Livia Snellen is in his office.  Follow Up Instructions:   I discussed the assessment and treatment plan with the patient. The patient was provided an opportunity to ask questions and all were answered. The patient agreed with the plan and demonstrated an understanding of the instructions.   The patient was advised to call back or seek an in-person evaluation if the symptoms worsen or if the  condition fails to improve as anticipated.   Total minutes including chart review and phone contact time: 25   Follow up plan: Return in about 6 months (around 08/27/2019).  Claretta Fraise, MD [place]

## 2019-03-24 ENCOUNTER — Encounter (INDEPENDENT_AMBULATORY_CARE_PROVIDER_SITE_OTHER): Payer: Self-pay

## 2019-04-24 DIAGNOSIS — H1013 Acute atopic conjunctivitis, bilateral: Secondary | ICD-10-CM | POA: Diagnosis not present

## 2019-04-24 DIAGNOSIS — H04123 Dry eye syndrome of bilateral lacrimal glands: Secondary | ICD-10-CM | POA: Diagnosis not present

## 2019-04-24 DIAGNOSIS — H2513 Age-related nuclear cataract, bilateral: Secondary | ICD-10-CM | POA: Diagnosis not present

## 2019-04-30 DIAGNOSIS — L820 Inflamed seborrheic keratosis: Secondary | ICD-10-CM | POA: Diagnosis not present

## 2019-04-30 DIAGNOSIS — D1801 Hemangioma of skin and subcutaneous tissue: Secondary | ICD-10-CM | POA: Diagnosis not present

## 2019-04-30 DIAGNOSIS — D485 Neoplasm of uncertain behavior of skin: Secondary | ICD-10-CM | POA: Diagnosis not present

## 2019-04-30 DIAGNOSIS — D225 Melanocytic nevi of trunk: Secondary | ICD-10-CM | POA: Diagnosis not present

## 2019-04-30 DIAGNOSIS — C44519 Basal cell carcinoma of skin of other part of trunk: Secondary | ICD-10-CM | POA: Diagnosis not present

## 2019-04-30 DIAGNOSIS — L821 Other seborrheic keratosis: Secondary | ICD-10-CM | POA: Diagnosis not present

## 2019-04-30 DIAGNOSIS — L814 Other melanin hyperpigmentation: Secondary | ICD-10-CM | POA: Diagnosis not present

## 2019-05-02 ENCOUNTER — Encounter: Payer: Self-pay | Admitting: *Deleted

## 2019-05-21 ENCOUNTER — Ambulatory Visit: Payer: Medicare Other | Admitting: Cardiology

## 2019-05-29 ENCOUNTER — Ambulatory Visit (INDEPENDENT_AMBULATORY_CARE_PROVIDER_SITE_OTHER): Payer: Medicare Other | Admitting: Family Medicine

## 2019-05-29 ENCOUNTER — Other Ambulatory Visit: Payer: Self-pay

## 2019-05-29 DIAGNOSIS — E782 Mixed hyperlipidemia: Secondary | ICD-10-CM | POA: Diagnosis not present

## 2019-05-29 DIAGNOSIS — H259 Unspecified age-related cataract: Secondary | ICD-10-CM | POA: Diagnosis not present

## 2019-05-29 DIAGNOSIS — E559 Vitamin D deficiency, unspecified: Secondary | ICD-10-CM

## 2019-05-29 DIAGNOSIS — G500 Trigeminal neuralgia: Secondary | ICD-10-CM | POA: Diagnosis not present

## 2019-05-29 NOTE — Progress Notes (Signed)
Subjective:    Patient ID: Kathryn Young, female    DOB: Apr 06, 1946, 73 y.o.   MRN: [id number]   HPI: Kathryn Young is a 73 y.o. female presenting for  presents for  follow-up of  in for follow-up of elevated cholesterol. Doing well without complaints on current medication. Denies side effects of statin including myalgia and arthralgia and nausea. Currently no chest pain, shortness of breath or other cardiovascular related symptoms noted.  Denies anxiety, depression sx including nervousness, worrying, sad, blue, down. Restless legs doing well. Does have some sleep disturbance.   Some residual paresthesia noted from trigeminal neuralgia that is manageable.     Depression screen [facility] 2/9 07/05/2018 01/02/2018 12/19/2017 12/13/2017 03/13/2017  Decreased Interest 0 0 0 0 0  Down, Depressed, Hopeless 0 0 0 0 0  PHQ - 2 Score 0 0 0 0 0     Relevant past medical, surgical, family and social history reviewed and updated as indicated.  Interim medical history since our last visit reviewed. Allergies and medications reviewed and updated.  ROS:  Review of Systems  Constitutional: Negative.   HENT: Negative for congestion.   Eyes: Negative for visual disturbance.  Respiratory: Negative for shortness of breath.   Cardiovascular: Negative for chest pain.  Gastrointestinal: Negative for abdominal pain, constipation, diarrhea, nausea and vomiting.  Genitourinary: Negative for difficulty urinating.  Musculoskeletal: Negative for arthralgias and myalgias.  Neurological: Negative for headaches.  Psychiatric/Behavioral: Negative for sleep disturbance.     Social History   Tobacco Use  Smoking Status Never Smoker  Smokeless Tobacco Never Used       Objective:     Wt Readings from Last 3 Encounters:  07/05/18 146 lb (66.2 kg)  02/08/18 152 lb (68.9 kg)  01/02/18 149 lb (67.6 kg)     Exam deferred. Pt. Harboring due to COVID 19. Phone visit performed.   Assessment & Plan:   1.  Mixed hyperlipidemia   2. Trigeminal neuralgia of right side of face   3. Age-related cataract of both eyes, unspecified age-related cataract type   4. Vitamin D deficiency     Meds ordered this encounter  Medications  . DULoxetine (CYMBALTA) 30 MG capsule    Sig: Take 1 capsule (30 mg total) by mouth every other day for 14 days.    Dispense:  7 capsule    Refill:  0    Orders Placed This Encounter  Procedures  . VITAMIN D 25 Hydroxy (Vit-D Deficiency, Fractures)  . CMP14+EGFR    Order Specific Question:   Has the patient fasted?    Answer:   Yes  . CBC with Differential/Platelet  . Lipid panel    Order Specific Question:   Has the patient fasted?    Answer:   Yes      Diagnoses and all orders for this visit:  Mixed hyperlipidemia -     CMP14+EGFR -     CBC with Differential/Platelet -     Lipid panel  Trigeminal neuralgia of right side of face -     CMP14+EGFR -     CBC with Differential/Platelet -     Lipid panel  Age-related cataract of both eyes, unspecified age-related cataract type -     CMP14+EGFR -     CBC with Differential/Platelet -     Lipid panel  Vitamin D deficiency -     VITAMIN D 25 Hydroxy (Vit-D Deficiency, Fractures)  Other orders -  DULoxetine (CYMBALTA) 30 MG capsule; Take 1 capsule (30 mg total) by mouth every other day for 14 days.    Virtual Visit via telephone Note  I discussed the limitations, risks, security and privacy concerns of performing an evaluation and management service by telephone and the availability of in person appointments. The patient was identified with two identifiers. Pt.expressed understanding and agreed to proceed. Pt. Is at home. Dr. Livia Snellen is in his office.  Follow Up Instructions:   I discussed the assessment and treatment plan with the patient. The patient was provided an opportunity to ask questions and all were answered. The patient agreed with the plan and demonstrated an understanding of the  instructions.   The patient was advised to call back or seek an in-person evaluation if the symptoms worsen or if the condition fails to improve as anticipated.   Total minutes including chart review and phone contact time: 23   Follow up plan: Return in about 6 months (around 11/29/2019) for Wellness.  Claretta Fraise, MD [place]

## 2019-05-30 ENCOUNTER — Other Ambulatory Visit: Payer: Medicare Other

## 2019-05-30 ENCOUNTER — Other Ambulatory Visit: Payer: Self-pay

## 2019-05-30 ENCOUNTER — Ambulatory Visit: Payer: Medicare Other | Admitting: Family Medicine

## 2019-05-30 DIAGNOSIS — E782 Mixed hyperlipidemia: Secondary | ICD-10-CM

## 2019-05-30 DIAGNOSIS — Z0000 Encounter for general adult medical examination without abnormal findings: Secondary | ICD-10-CM

## 2019-05-30 DIAGNOSIS — Z1321 Encounter for screening for nutritional disorder: Secondary | ICD-10-CM

## 2019-05-30 DIAGNOSIS — E559 Vitamin D deficiency, unspecified: Secondary | ICD-10-CM

## 2019-05-31 LAB — LIPID PANEL
Chol/HDL Ratio: 3.3 ratio (ref 0.0–4.4)
Cholesterol, Total: 177 mg/dL (ref 100–199)
HDL: 53 mg/dL (ref >39)
LDL Calculated: 93 mg/dL (ref 0–99)
Triglycerides: 154 mg/dL — ABNORMAL HIGH (ref 0–149)
VLDL Cholesterol Cal: 31 mg/dL (ref 5–40)

## 2019-05-31 LAB — CBC WITH DIFFERENTIAL/PLATELET
Basophils Absolute: 0 10*3/uL (ref 0.0–0.2)
Basos: 1 %
EOS (ABSOLUTE): 0.2 10*3/uL (ref 0.0–0.4)
Eos: 2 %
Hematocrit: 37.9 % (ref 34.0–46.6)
Hemoglobin: 13.3 g/dL (ref 11.1–15.9)
Immature Grans (Abs): 0 10*3/uL (ref 0.0–0.1)
Immature Granulocytes: 0 %
Lymphocytes Absolute: 2.5 10*3/uL (ref 0.7–3.1)
Lymphs: 39 %
MCH: 32.8 pg (ref 26.6–33.0)
MCHC: 35.1 g/dL (ref 31.5–35.7)
MCV: 94 fL (ref 79–97)
Monocytes Absolute: 0.5 10*3/uL (ref 0.1–0.9)
Monocytes: 8 %
Neutrophils Absolute: 3.3 10*3/uL (ref 1.4–7.0)
Neutrophils: 50 %
Platelets: 222 10*3/uL (ref 150–450)
RBC: 4.05 x10E6/uL (ref 3.77–5.28)
RDW: 12.7 % (ref 11.7–15.4)
WBC: 6.4 10*3/uL (ref 3.4–10.8)

## 2019-05-31 LAB — CMP14+EGFR
ALT: 9 IU/L (ref 0–32)
AST: 15 IU/L (ref 0–40)
Albumin/Globulin Ratio: 2.3 — ABNORMAL HIGH (ref 1.2–2.2)
Albumin: 4.6 g/dL (ref 3.7–4.7)
Alkaline Phosphatase: 92 IU/L (ref 39–117)
BUN/Creatinine Ratio: 15 (ref 12–28)
BUN: 14 mg/dL (ref 8–27)
Bilirubin Total: 0.4 mg/dL (ref 0.0–1.2)
CO2: 25 mmol/L (ref 20–29)
Calcium: 9.3 mg/dL (ref 8.7–10.3)
Chloride: 102 mmol/L (ref 96–106)
Creatinine, Ser: 0.92 mg/dL (ref 0.57–1.00)
GFR calc Af Amer: 72 mL/min/{1.73_m2} (ref >59)
GFR calc non Af Amer: 62 mL/min/{1.73_m2} (ref >59)
Globulin, Total: 2 g/dL (ref 1.5–4.5)
Glucose: 98 mg/dL (ref 65–99)
Potassium: 4 mmol/L (ref 3.5–5.2)
Sodium: 142 mmol/L (ref 134–144)
Total Protein: 6.6 g/dL (ref 6.0–8.5)

## 2019-05-31 LAB — VITAMIN D 25 HYDROXY (VIT D DEFICIENCY, FRACTURES): Vit D, 25-Hydroxy: 41.7 ng/mL (ref 30.0–100.0)

## 2019-06-01 ENCOUNTER — Encounter: Payer: Self-pay | Admitting: Family Medicine

## 2019-06-01 MED ORDER — DULOXETINE HCL 30 MG PO CPEP: 30.0000 mg | ORAL_CAPSULE | ORAL | 0 refills | Status: DC

## 2019-06-03 ENCOUNTER — Ambulatory Visit: Payer: Medicare Other | Admitting: *Deleted

## 2019-06-03 DIAGNOSIS — Z0000 Encounter for general adult medical examination without abnormal findings: Secondary | ICD-10-CM

## 2019-06-03 NOTE — Progress Notes (Addendum)
MEDICARE ANNUAL WELLNESS VISIT  06/03/2019  Telephone Visit Disclaimer This Medicare AWV was conducted by telephone due to national recommendations for restrictions regarding the COVID-19 Pandemic (e.g. social distancing).  I verified, using two identifiers, that I am speaking with Kathryn Young or their authorized healthcare agent. I discussed the limitations, risks, security, and privacy concerns of performing an evaluation and management service by telephone and the potential availability of an in-person appointment in the future. The patient expressed understanding and agreed to proceed.   Subjective:  Kathryn Young is a 73 y.o. female patient of Stacks, Cletus Gash, MD who had a Medicare Annual Wellness Visit today via telephone. [name] is Retired and lives with their spouse. she has 1 child. she reports that she is socially active and does interact with friends/family regularly. she is moderately physically active and enjoys doing word search puzzles, fixing photo albums and working with her [employer].  Patient Care Team: Claretta Fraise, MD as PCP - General (Family Medicine) Harl Bowie Alphonse Guild, MD as PCP - Cardiology (Cardiology) Georgina Quint, MD as Referring Physician (Radiology)  Advanced Directives 06/03/2019 12/19/2017 03/27/2016 03/20/2016 04/05/2015  Does Patient Have a Medical Advance Directive? Yes Yes Yes Yes Yes  Type of Advance Directive Living will Living will Healthcare Power of [place] -  Does patient want to make changes to medical advance directive? No - Patient declined (No Data) No - Patient declined - [facility] Utilization Over the Past 12 Months: # of hospitalizations or ER visits: 0 # of surgeries: 0  Review of Systems    Patient reports that her overall health is better compared to last year.  Patient Reported Readings (BP, Pulse, CBG, Weight, etc) none  Review of Systems: No complaints  All other systems negative.  Pain  Assessment Pain : No/denies pain     Current Medications & Allergies (verified) Allergies as of 06/03/2019   No Known Allergies     Medication List       Accurate as of June 03, 2019  2:29 PM. If you have any questions, ask your nurse or doctor.        STOP taking these medications   calcium-vitamin D 500-200 MG-UNIT tablet Commonly known as: OSCAL WITH D     TAKE these medications   DULoxetine 30 MG capsule Commonly known as: CYMBALTA Take 1 capsule (30 mg total) by mouth every other day for 14 days.   fluticasone 50 MCG/ACT nasal spray Commonly known as: FLONASE Place 2 sprays into both nostrils daily.   levocetirizine 5 MG tablet Commonly known as: XYZAL Take 1 tablet (5 mg total) by mouth every evening.   pantoprazole 40 MG tablet Commonly known as: PROTONIX TAKE (1) TABLET DAILY FOR STOMACH   PATADAY OP Apply to eye.   raloxifene 60 MG tablet Commonly known as: EVISTA Take 1 tablet (60 mg total) by mouth daily.   simvastatin 20 MG tablet Commonly known as: ZOCOR Take 1 tablet (20 mg total) by mouth daily.   SYSTANE COMPLETE OP Apply to eye.   Vitamin D 50 MCG (2000 UT) tablet TAKE (1) CAPSULE DAILY       History (reviewed): Past Medical History:  Diagnosis Date  . 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 Breast mass, right   . Hyperlipidemia   . Hypertension    no meds  . Meningitis    Age 72  . Neuromuscular disorder ([place])  right shoulder with burning down arm  . SUNCT (short unilateral neuralgiform headache, conjunctival inj/tear) 02/16/2017   Past Surgical History:  Procedure Laterality Date  . ABDOMINAL HYSTERECTOMY  1990  . BREAST LUMPECTOMY Right 2017  . BREAST LUMPECTOMY Left    pt states she is not sure of when or where believes it was in [place].  Marland Kitchen BREAST LUMPECTOMY WITH RADIOACTIVE SEED LOCALIZATION Right 03/27/2016   Procedure: RIGHT BREAST LUMPECTOMYAFTER RADIOACTIVE SEED LOCALIZATION;  Surgeon: Jackolyn Confer, MD;   Location: [facility];  Service: General;  Laterality: Right;  RIGHT BREAST LUMPECTOMYAFTER RADIOACTIVE SEED LOCALIZATION  . BREAST SURGERY Left 1981   benign cysts removed  . Kidney Stones     Family History  Problem Relation Age of Onset  . Cancer Mother        Breast  . Breast cancer Mother   . Other Father        Drowned  . Prostate cancer Paternal Grandfather   . Heart attack Paternal Grandfather   . Pneumonia Brother    Social History   Socioeconomic History  . Marital status: Married    Spouse name: [name]  . Number of children: 1  . Years of education: 50  . Highest education level: High school graduate  Occupational History  . Occupation: Retired  Scientific laboratory technician  . Financial resource strain: Not hard at all  . Food insecurity    Worry: Never true    Inability: Never true  . Transportation needs    Medical: No    Non-medical: No  Tobacco Use  . Smoking status: Never Smoker  . Smokeless tobacco: Never Used  Substance and Sexual Activity  . Alcohol use: No  . Drug use: No  . Sexual activity: Yes    Partners: Male    Birth control/protection: Surgical    Comment: Married  Lifestyle  . Physical activity    Days per week: 3 days    Minutes per session: 50 min  . Stress: Not at all  Relationships  . Social connections    Talks on phone: More than three times a week    Gets together: More than three times a week    Attends religious service: More than 4 times per year    Active member of club or organization: Yes    Attends meetings of clubs or organizations: More than 4 times per year    Relationship status: Married  Other Topics Concern  . Not on file  Social History Narrative   Lives at home w/ her husband and grandson   Right-handed   Caffeine: 2 cups of coffee and tea throughout the day    Activities of Daily Living In your present state of health, do you have any difficulty performing the following activities: 06/03/2019  Hearing? N   Vision? Y  Comment has cataracts and has to follow up in 6 months to decide if she needs to have them removed  Difficulty concentrating or making decisions? N  Walking or climbing stairs? N  Dressing or bathing? N  Doing errands, shopping? N  Preparing Food and eating ? N  Using the Toilet? N  In the past six months, have you accidently leaked urine? N  Do you have problems with loss of bowel control? N  Managing your Medications? N  Managing your Finances? N  Housekeeping or managing your Housekeeping? N  Some recent data might be hidden    Patient Education/ Literacy How often do  you need to have someone help you when you read instructions, pamphlets, or other written materials from your doctor or pharmacy?: 1 - Never What is the last grade level you completed in school?: 12th grade  Exercise Current Exercise Habits: Home exercise routine, Type of exercise: walking, Time (Minutes): 50, Frequency (Times/Week): 3, Weekly Exercise (Minutes/Week): 150, Exercise limited by: None identified  Diet Patient reports consuming 2 meals a day and 1 snack(s) a day Patient reports that her primary diet is: Regular Patient reports that she does have regular access to food.   Depression Screen PHQ 2/9 Scores 06/03/2019 07/05/2018 01/02/2018 12/19/2017 12/13/2017 03/13/2017 09/27/2016  PHQ - 2 Score 0 0 0 0 0 0 0     Fall Risk Fall Risk  06/03/2019 07/05/2018 01/02/2018 12/19/2017 12/13/2017  Falls in the past year? 0 No No No No  Number falls in past yr: - - - - -  Injury with Fall? - - - - -     Objective:  Kathryn Young seemed alert and oriented and she participated appropriately during our telephone visit.  Blood Pressure Weight BMI  BP Readings from Last 3 Encounters:  07/05/18 (!) 160/79  02/08/18 (!) 148/86  01/02/18 (!) 158/82   Wt Readings from Last 3 Encounters:  07/05/18 146 lb (66.2 kg)  02/08/18 152 lb (68.9 kg)  01/02/18 149 lb (67.6 kg)   BMI Readings from Last 1 Encounters:   07/05/18 25.86 kg/m    *Unable to obtain current vital signs, weight, and BMI due to telephone visit type  Hearing/Vision  . Zarin did not seem to have difficulty with hearing/understanding during the telephone conversation . Reports that she has had a formal eye exam by an eye care professional within the past year . Reports that she has not had a formal hearing evaluation within the past year *Unable to fully assess hearing and vision during telephone visit type  Cognitive Function: 6CIT Screen 06/03/2019  What Year? 0 points  What month? 0 points  What time? 0 points  Count back from 20 0 points  Months in reverse 0 points  Repeat phrase 0 points  Total Score 0   (Normal:0-7, Significant for Dysfunction: >8)  Normal Cognitive Function Screening: Yes   Immunization & Health Maintenance Record Immunization History  Administered Date(s) Administered  . Influenza, High Dose Seasonal PF 07/31/2016, 07/28/2017  . Influenza-Unspecified 07/25/2014, 07/25/2015, 07/29/2018  . Pneumococcal Conjugate-13 02/18/2016  . Pneumococcal Polysaccharide-23 09/16/2011  . Tdap 04/06/2010  . Zoster 11/21/2007    Health Maintenance  Topic Date Due  . INFLUENZA VACCINE  05/17/2019  . TETANUS/TDAP  04/06/2020  . MAMMOGRAM  06/20/2020  . COLONOSCOPY  07/07/2026  . DEXA SCAN  Completed  . Hepatitis C Screening  Completed  . PNA vac Low Risk Adult  Completed       Assessment  This is a routine wellness examination for Kathryn Young.  Health Maintenance: Due or Overdue Health Maintenance Due  Topic Date Due  . INFLUENZA VACCINE  05/17/2019    Kathryn Young does not need a referral for Community Assistance: Care Management:   no Social Work:    no Prescription Assistance:  no Nutrition/Diabetes Education:  no   Plan:  Personalized Goals Goals Addressed            This Visit's Progress   . DIET - INCREASE WATER INTAKE       Try to drink 6-8 glasses of water daily.  Personalized Health Maintenance & Screening Recommendations  Influenza vaccine  Lung Cancer Screening Recommended: no (Low Dose CT Chest recommended if Age 33-80 years, 30 pack-year currently smoking OR have quit w/in past 15 years) Hepatitis C Screening recommended: no HIV Screening recommended: no  Advanced Directives: Written information was not prepared per patient's request.  Referrals & Orders No orders of the defined types were placed in this encounter.   Follow-up Plan . Follow-up with Claretta Fraise, MD as planned . Get your flu shot at your next visit with your PCP   I have personally reviewed and noted the following in the patient's chart:   . Medical and social history . Use of alcohol, tobacco or illicit drugs  . Current medications and supplements . Functional ability and status . Nutritional status . Physical activity . Advanced directives . List of other physicians . Hospitalizations, surgeries, and ER visits in previous 12 months . Vitals . Screenings to include cognitive, depression, and falls . Referrals and appointments  In addition, I have reviewed and discussed with Kathryn Young certain preventive protocols, quality metrics, and best practice recommendations. A written personalized care plan for preventive services as well as general preventive health recommendations is available and can be mailed to the patient at her request.      Marylin Crosby, LPN  03/31/736  I have reviewed and agree with the above AWV documentation.   Mary-Margaret Hassell Done, FNP

## 2019-06-03 NOTE — Patient Instructions (Signed)
Preventive Care 73 Years and Older, Female Preventive care refers to lifestyle choices and visits with your health care provider that can promote health and wellness. This includes:  A yearly physical exam. This is also called an annual well check.  Regular dental and eye exams.  Immunizations.  Screening for certain conditions.  Healthy lifestyle choices, such as diet and exercise. What can I expect for my preventive care visit? Physical exam Your health care provider will check:  Height and weight. These may be used to calculate body mass index (BMI), which is a measurement that tells if you are at a healthy weight.  Heart rate and blood pressure.  Your skin for abnormal spots. Counseling Your health care provider may ask you questions about:  Alcohol, tobacco, and drug use.  Emotional well-being.  Home and relationship well-being.  Sexual activity.  Eating habits.  History of falls.  Memory and ability to understand (cognition).  Work and work Statistician.  Pregnancy and menstrual history. What immunizations do I need?  Influenza (flu) vaccine  This is recommended every year. Tetanus, diphtheria, and pertussis (Tdap) vaccine  You may need a Td booster every 10 years. Varicella (chickenpox) vaccine  You may need this vaccine if you have not already been vaccinated. Zoster (shingles) vaccine  You may need this after age 33. Pneumococcal conjugate (PCV13) vaccine  One dose is recommended after age 33. Pneumococcal polysaccharide (PPSV23) vaccine  One dose is recommended after age 72. Measles, mumps, and rubella (MMR) vaccine  You may need at least one dose of MMR if you were born in 1957 or later. You may also need a second dose. Meningococcal conjugate (MenACWY) vaccine  You may need this if you have certain conditions. Hepatitis A vaccine  You may need this if you have certain conditions or if you travel or work in places where you may be exposed  to hepatitis A. Hepatitis B vaccine  You may need this if you have certain conditions or if you travel or work in places where you may be exposed to hepatitis B. Haemophilus influenzae type b (Hib) vaccine  You may need this if you have certain conditions. You may receive vaccines as individual doses or as more than one vaccine together in one shot (combination vaccines). Talk with your health care provider about the risks and benefits of combination vaccines. What tests do I need? Blood tests  Lipid and cholesterol levels. These may be checked every 5 years, or more frequently depending on your overall health.  Hepatitis C test.  Hepatitis B test. Screening  Lung cancer screening. You may have this screening every year starting at age 39 if you have a 30-pack-year history of smoking and currently smoke or have quit within the past 15 years.  Colorectal cancer screening. All adults should have this screening starting at age 36 and continuing until age 15. Your health care provider may recommend screening at age 23 if you are at increased risk. You will have tests every 1-10 years, depending on your results and the type of screening test.  Diabetes screening. This is done by checking your blood sugar (glucose) after you have not eaten for a while (fasting). You may have this done every 1-3 years.  Mammogram. This may be done every 1-2 years. Talk with your health care provider about how often you should have regular mammograms.  BRCA-related cancer screening. This may be done if you have a family history of breast, ovarian, tubal, or peritoneal cancers.  Other tests  Sexually transmitted disease (STD) testing.  Bone density scan. This is done to screen for osteoporosis. You may have this done starting at age 55. Follow these instructions at home: Eating and drinking  Eat a diet that includes fresh fruits and vegetables, whole grains, lean protein, and low-fat dairy products. Limit  your intake of foods with high amounts of sugar, saturated fats, and salt.  Take vitamin and mineral supplements as recommended by your health care provider.  Do not drink alcohol if your health care provider tells you not to drink.  If you drink alcohol: ? Limit how much you have to 0-1 drink a day. ? Be aware of how much alcohol is in your drink. In the [place] one drink equals one 12 oz bottle of beer (355 mL), one 5 oz glass of wine (148 mL), or one 1 oz glass of hard liquor (44 mL). Lifestyle  Take daily care of your teeth and gums.  Stay active. Exercise for at least 30 minutes on 5 or more days each week.  Do not use any products that contain nicotine or tobacco, such as cigarettes, e-cigarettes, and chewing tobacco. If you need help quitting, ask your health care provider.  If you are sexually active, practice safe sex. Use a condom or other form of protection in order to prevent STIs (sexually transmitted infections).  Talk with your health care provider about taking a low-dose aspirin or statin. What's next?  Go to your health care provider once a year for a well check visit.  Ask your health care provider how often you should have your eyes and teeth checked.  Stay up to date on all vaccines. This information is not intended to replace advice given to you by your health care provider. Make sure you discuss any questions you have with your health care provider. Document Released: 10/29/2015 Document Revised: 09/26/2018 Document Reviewed: 09/26/2018 Elsevier Patient Education  2020 [employer].

## 2019-07-01 DIAGNOSIS — Z23 Encounter for immunization: Secondary | ICD-10-CM | POA: Diagnosis not present

## 2019-07-11 ENCOUNTER — Ambulatory Visit: Payer: Medicare Other | Admitting: Cardiology

## 2019-08-13 ENCOUNTER — Telehealth: Payer: Self-pay | Admitting: Cardiology

## 2019-08-13 NOTE — Telephone Encounter (Signed)

## 2019-08-14 ENCOUNTER — Encounter: Payer: Self-pay | Admitting: Cardiology

## 2019-08-14 ENCOUNTER — Telehealth (INDEPENDENT_AMBULATORY_CARE_PROVIDER_SITE_OTHER): Payer: Medicare Other | Admitting: Cardiology

## 2019-08-14 VITALS — BP 136/67 | HR 79 | Ht 63.5 in | Wt 145.0 lb

## 2019-08-14 DIAGNOSIS — I340 Nonrheumatic mitral (valve) insufficiency: Secondary | ICD-10-CM | POA: Diagnosis not present

## 2019-08-14 NOTE — Addendum Note (Signed)
Addended by: Debbora Lacrosse R on: 08/14/2019 11:19 AM   Modules accepted: Orders

## 2019-08-14 NOTE — Progress Notes (Signed)
Virtual Visit via Telephone Note   This visit type was conducted due to national recommendations for restrictions regarding the COVID-19 Pandemic (e.g. social distancing) in an effort to limit this patient's exposure and mitigate transmission in our community.  Due to her co-morbid illnesses, this patient is at least at moderate risk for complications without adequate follow up.  This format is felt to be most appropriate for this patient at this time.  The patient did not have access to video technology/had technical difficulties with video requiring transitioning to audio format only (telephone).  All issues noted in this document were discussed and addressed.  No physical exam could be performed with this format.  Please refer to the patient's chart for her  consent to telehealth for [facility].   Date:  08/14/2019   ID:  Kathryn Young, DOB 07-18-1946, MRN [id number]  Patient Location: Home Provider Location: Office  PCP:  Claretta Fraise, MD  Cardiologist:  Carlyle Dolly, MD  Electrophysiologist:  None   Evaluation Performed:  Follow-Up Visit  Chief Complaint:  Follow up  History of Present Illness:    Kathryn Young is a 73 y.o. female seen today for follow up of the following medical problems.   1. Mitral regurgitation - reports told 25 years ago had heart murmur - reports some deconditioning, but overall no significant SOB/DOE.Walks up basement stairs 2-3 times per day without troubles - no chest pain. No edema.    02/2018 echo: LVEF 0000000, grade I diastolic dysfunction, moderate MR, bilateral MV leaflet prolapse  - no recent SOB,no DOE. No recent LE edema. Walks regularly without troubles, up to 30 minutes. Mainly limited by back pains.    The patient does not have symptoms concerning for COVID-19 infection (fever, chills, cough, or new shortness of breath).    Past Medical History:  Diagnosis Date  . Brain infection    Age 28  . Breast mass, right   .  Hyperlipidemia   . Hypertension    no meds  . Meningitis    Age 69  . Neuromuscular disorder ([place])    right shoulder with burning down arm  . SUNCT (short unilateral neuralgiform headache, conjunctival inj/tear) 02/16/2017   Past Surgical History:  Procedure Laterality Date  . ABDOMINAL HYSTERECTOMY  1990  . BREAST LUMPECTOMY Right 2017  . BREAST LUMPECTOMY Left    pt states she is not sure of when or where believes it was in [place].  Marland Kitchen BREAST LUMPECTOMY WITH RADIOACTIVE SEED LOCALIZATION Right 03/27/2016   Procedure: RIGHT BREAST LUMPECTOMYAFTER RADIOACTIVE SEED LOCALIZATION;  Surgeon: Jackolyn Confer, MD;  Location: [place];  Service: General;  Laterality: Right;  RIGHT BREAST LUMPECTOMYAFTER RADIOACTIVE SEED LOCALIZATION  . BREAST SURGERY Left 1981   benign cysts removed  . Kidney Stones       No outpatient medications have been marked as taking for the 08/14/19 encounter (Appointment) with Arnoldo Lenis, MD.     Allergies:   Patient has no known allergies.   Social History   Tobacco Use  . Smoking status: Never Smoker  . Smokeless tobacco: Never Used  Substance Use Topics  . Alcohol use: No  . Drug use: No     Family Hx: The patient's family history includes Breast cancer in her mother; Cancer in her mother; Heart attack in her paternal grandfather; Other in her father; Pneumonia in her brother; Prostate cancer in her paternal grandfather.  ROS:   Please see the history  of present illness.     All other systems reviewed and are negative.   Prior CV studies:   The following studies were reviewed today:  02/2018 echo Study Conclusions  - Left ventricle: The cavity size was normal. Wall thickness was   increased in a pattern of mild LVH. Systolic function was normal.   The estimated ejection fraction was in the range of 55% to 60%.   Wall motion was normal; there were no regional wall motion   abnormalities. Doppler parameters are  consistent with abnormal   left ventricular relaxation (grade 1 diastolic dysfunction).   Doppler parameters are consistent with indeterminate ventricular   filling pressure. - Mitral valve: Mild bileaflet prolapse. Mildly thickened leaflets   . There was moderate regurgitation. - Left atrium: The atrium was severely dilated. - Tricuspid valve: There was mild regurgitation.  Labs/Other Tests and Data Reviewed:    EKG:  No ECG reviewed.  Recent Labs: 05/30/2019: ALT 9; BUN 14; Creatinine, Ser 0.92; Hemoglobin 13.3; Platelets 222; Potassium 4.0; Sodium 142   Recent Lipid Panel Lab Results  Component Value Date/Time   CHOL 177 05/30/2019 08:20 AM   TRIG 154 (H) 05/30/2019 08:20 AM   TRIG 115 06/03/2013 09:57 AM   HDL 53 05/30/2019 08:20 AM   HDL 61 06/03/2013 09:57 AM   CHOLHDL 3.3 05/30/2019 08:20 AM   LDLCALC 93 05/30/2019 08:20 AM   LDLCALC 75 06/03/2013 09:57 AM    Wt Readings from Last 3 Encounters:  07/05/18 146 lb (66.2 kg)  02/08/18 152 lb (68.9 kg)  01/02/18 149 lb (67.6 kg)     Objective:    Vital Signs:   Today's Vitals   08/14/19 1009  BP: 136/67  Pulse: 79  Weight: 145 lb (65.8 kg)  Height: 5' 3.5" (1.613 m)   Body mass index is 25.28 kg/m.  Normal affect. NOrmal speech pattern and tone. Comfortable, no apparent distress. NO audible signs of SOB or wheezing.   ASSESSMENT & PLAN:    1. Mitral regurgitation - moderate by echo last year, repeat echo for ongoing surveillance. She is asymptomatic.     COVID-19 Education: The signs and symptoms of COVID-19 were discussed with the patient and how to seek care for testing (follow up with PCP or arrange E-visit).  The importance of social distancing was discussed today.  Time:   Today, I have spent 14 minutes with the patient with telehealth technology discussing the above problems.     Medication Adjustments/Labs and Tests Ordered: Current medicines are reviewed at length with the patient today.   Concerns regarding medicines are outlined above.   Tests Ordered: No orders of the defined types were placed in this encounter.   Medication Changes: No orders of the defined types were placed in this encounter.   Follow Up:  In Person in 6 month(s)  Signed, Carlyle Dolly, MD  08/14/2019 8:26 AM    [place]

## 2019-08-26 ENCOUNTER — Ambulatory Visit (HOSPITAL_COMMUNITY)
Admission: RE | Admit: 2019-08-26 | Discharge: 2019-08-26 | Disposition: A | Discharge status: Home / Self Care | Payer: Medicare Other | Source: Ambulatory Visit | Attending: Cardiology | Admitting: Cardiology

## 2019-08-26 ENCOUNTER — Other Ambulatory Visit: Payer: Self-pay

## 2019-08-26 DIAGNOSIS — I340 Nonrheumatic mitral (valve) insufficiency: Secondary | ICD-10-CM

## 2019-08-26 NOTE — Progress Notes (Signed)
*  PRELIMINARY RESULTS* Echocardiogram 2D Echocardiogram has been performed.  Samuel Germany 08/26/2019, 4:02 PM

## 2019-09-23 ENCOUNTER — Other Ambulatory Visit: Payer: Self-pay

## 2019-09-23 ENCOUNTER — Ambulatory Visit (INDEPENDENT_AMBULATORY_CARE_PROVIDER_SITE_OTHER): Payer: Medicare Other | Admitting: Family

## 2019-09-23 ENCOUNTER — Encounter: Payer: Self-pay | Admitting: Family

## 2019-09-23 DIAGNOSIS — J209 Acute bronchitis, unspecified: Secondary | ICD-10-CM

## 2019-09-23 MED ORDER — BENZONATATE 200 MG PO CAPS: 200.0000 mg | ORAL_CAPSULE | Freq: Three times a day (TID) | ORAL | 1 refills | Status: DC | PRN

## 2019-09-23 MED ORDER — PREDNISONE 10 MG (21) PO TBPK: ORAL_TABLET | ORAL | 0 refills | Status: DC

## 2019-09-23 NOTE — Progress Notes (Signed)
Virtual Visit via telephone Note Due to COVID-19 pandemic this visit was conducted virtually. This visit type was conducted due to national recommendations for restrictions regarding the COVID-19 Pandemic (e.g. social distancing, sheltering in place) in an effort to limit this patient's exposure and mitigate transmission in our community. All issues noted in this document were discussed and addressed.  A physical exam was not performed with this format.  I connected with Kathryn Young on 09/23/19 at 2:38 pm by telephone and verified that I am speaking with the correct person using two identifiers. Kathryn Young is currently located at home and no one is currently with her during visit. The provider, Evelina Dun, FNP is located in their office at time of visit.  I discussed the limitations, risks, security and privacy concerns of performing an evaluation and management service by telephone and the availability of in person appointments. I also discussed with the patient that there may be a patient responsible charge related to this service. The patient expressed understanding and agreed to proceed.   History and Present Illness:  Cough This is a new problem. The current episode started in the past 7 days. The problem has been gradually worsening. The problem occurs every few minutes. The cough is non-productive. Associated symptoms include postnasal drip. Pertinent negatives include no chills, ear congestion, ear pain, fever, headaches, myalgias, nasal congestion, rhinorrhea, sore throat, shortness of breath or wheezing. She has tried rest, OTC cough suppressant and OTC inhaler for the symptoms. The treatment provided mild relief.      Review of Systems  Constitutional: Negative for chills and fever.  HENT: Positive for postnasal drip. Negative for ear pain, rhinorrhea and sore throat.   Respiratory: Positive for cough. Negative for shortness of breath and wheezing.   Musculoskeletal:  Negative for myalgias.  Neurological: Negative for headaches.  All other systems reviewed and are negative.    Observations/Objective: No SOB or distress noted, dry intermittent cough  Assessment and Plan: 1. Acute bronchitis, unspecified organism Pt is getting COVID tested tomorrow - Take meds as prescribed - Use a cool mist humidifier  -Use saline nose sprays frequently -Force fluids -For any cough or congestion  Use plain Mucinex- regular strength or max strength is fine -For fever or aces or pains- take tylenol or ibuprofen. -Throat lozenges if help -New toothbrush in 3 days Call if symptoms worsen or do not improve  - benzonatate (TESSALON) 200 MG capsule; Take 1 capsule (200 mg total) by mouth 3 (three) times daily as needed.  Dispense: 30 capsule; Refill: 1 - predniSONE (STERAPRED UNI-PAK 21 TAB) 10 MG (21) TBPK tablet; Use as directed  Dispense: 21 tablet; Refill: 0     I discussed the assessment and treatment plan with the patient. The patient was provided an opportunity to ask questions and all were answered. The patient agreed with the plan and demonstrated an understanding of the instructions.   The patient was advised to call back or seek an in-person evaluation if the symptoms worsen or if the condition fails to improve as anticipated.  The above assessment and management plan was discussed with the patient. The patient verbalized understanding of and has agreed to the management plan. Patient is aware to call the clinic if symptoms persist or worsen. Patient is aware when to return to the clinic for a follow-up visit. Patient educated on when it is appropriate to go to the emergency department.   Time call ended:  2:48 pm  I  provided 10 minutes of non-face-to-face time during this encounter.    Evelina Dun, FNP

## 2019-09-24 DIAGNOSIS — Z20828 Contact with and (suspected) exposure to other viral communicable diseases: Secondary | ICD-10-CM | POA: Diagnosis not present

## 2019-10-17 HISTORY — PX: CATARACT EXTRACTION: SUR2

## 2019-10-20 ENCOUNTER — Other Ambulatory Visit: Payer: Self-pay | Admitting: Family Medicine

## 2019-10-20 DIAGNOSIS — Z1231 Encounter for screening mammogram for malignant neoplasm of breast: Secondary | ICD-10-CM

## 2019-10-30 DIAGNOSIS — L84 Corns and callosities: Secondary | ICD-10-CM | POA: Diagnosis not present

## 2019-10-30 DIAGNOSIS — D1801 Hemangioma of skin and subcutaneous tissue: Secondary | ICD-10-CM | POA: Diagnosis not present

## 2019-10-30 DIAGNOSIS — D225 Melanocytic nevi of trunk: Secondary | ICD-10-CM | POA: Diagnosis not present

## 2019-10-30 DIAGNOSIS — L814 Other melanin hyperpigmentation: Secondary | ICD-10-CM | POA: Diagnosis not present

## 2019-10-30 DIAGNOSIS — L820 Inflamed seborrheic keratosis: Secondary | ICD-10-CM | POA: Diagnosis not present

## 2019-10-30 DIAGNOSIS — L821 Other seborrheic keratosis: Secondary | ICD-10-CM | POA: Diagnosis not present

## 2019-10-30 DIAGNOSIS — L570 Actinic keratosis: Secondary | ICD-10-CM | POA: Diagnosis not present

## 2019-10-30 DIAGNOSIS — D2239 Melanocytic nevi of other parts of face: Secondary | ICD-10-CM | POA: Diagnosis not present

## 2019-10-30 DIAGNOSIS — D692 Other nonthrombocytopenic purpura: Secondary | ICD-10-CM | POA: Diagnosis not present

## 2019-10-30 DIAGNOSIS — D485 Neoplasm of uncertain behavior of skin: Secondary | ICD-10-CM | POA: Diagnosis not present

## 2019-11-04 DIAGNOSIS — D2372 Other benign neoplasm of skin of left lower limb, including hip: Secondary | ICD-10-CM | POA: Diagnosis not present

## 2019-11-04 DIAGNOSIS — D2371 Other benign neoplasm of skin of right lower limb, including hip: Secondary | ICD-10-CM | POA: Diagnosis not present

## 2019-11-27 ENCOUNTER — Ambulatory Visit
Admission: RE | Admit: 2019-11-27 | Discharge: 2019-11-27 | Disposition: A | Discharge status: Home / Self Care | Payer: Medicare Other | Source: Ambulatory Visit | Attending: Family Medicine | Admitting: Family Medicine

## 2019-11-27 ENCOUNTER — Other Ambulatory Visit: Payer: Self-pay

## 2019-11-27 DIAGNOSIS — Z1231 Encounter for screening mammogram for malignant neoplasm of breast: Secondary | ICD-10-CM | POA: Diagnosis not present

## 2019-12-02 ENCOUNTER — Encounter: Payer: Medicare Other | Admitting: Family Medicine

## 2019-12-04 ENCOUNTER — Ambulatory Visit: Payer: Medicare Other | Admitting: Family Medicine

## 2019-12-12 ENCOUNTER — Other Ambulatory Visit: Payer: Self-pay

## 2019-12-15 ENCOUNTER — Ambulatory Visit (INDEPENDENT_AMBULATORY_CARE_PROVIDER_SITE_OTHER): Payer: Medicare Other | Admitting: Family Medicine

## 2019-12-15 ENCOUNTER — Other Ambulatory Visit: Payer: Self-pay

## 2019-12-15 ENCOUNTER — Encounter: Payer: Self-pay | Admitting: Family Medicine

## 2019-12-15 VITALS — BP 137/86 | HR 77 | Temp 98.0°F | Ht 63.5 in | Wt 142.6 lb

## 2019-12-15 DIAGNOSIS — K219 Gastro-esophageal reflux disease without esophagitis: Secondary | ICD-10-CM | POA: Diagnosis not present

## 2019-12-15 DIAGNOSIS — E559 Vitamin D deficiency, unspecified: Secondary | ICD-10-CM

## 2019-12-15 DIAGNOSIS — M8589 Other specified disorders of bone density and structure, multiple sites: Secondary | ICD-10-CM | POA: Diagnosis not present

## 2019-12-15 DIAGNOSIS — G500 Trigeminal neuralgia: Secondary | ICD-10-CM

## 2019-12-15 DIAGNOSIS — R202 Paresthesia of skin: Secondary | ICD-10-CM

## 2019-12-15 DIAGNOSIS — E782 Mixed hyperlipidemia: Secondary | ICD-10-CM

## 2019-12-15 MED ORDER — DULOXETINE HCL 30 MG PO CPEP: 30.0000 mg | ORAL_CAPSULE | ORAL | 0 refills | Status: DC

## 2019-12-15 MED ORDER — GABAPENTIN 300 MG PO CAPS: 600.0000 mg | ORAL_CAPSULE | Freq: Every day | ORAL | 0 refills | Status: DC

## 2019-12-15 MED ORDER — SIMVASTATIN 20 MG PO TABS: 20.0000 mg | ORAL_TABLET | Freq: Every day | ORAL | 3 refills | Status: DC

## 2019-12-15 MED ORDER — RALOXIFENE HCL 60 MG PO TABS: 60.0000 mg | ORAL_TABLET | Freq: Every day | ORAL | 3 refills | Status: DC

## 2019-12-15 MED ORDER — PANTOPRAZOLE SODIUM 40 MG PO TBEC: DELAYED_RELEASE_TABLET | ORAL | 3 refills | Status: DC

## 2019-12-15 NOTE — Patient Instructions (Signed)
Be sure to take calcium 600 mg twice a day and Vitamin D 2000 units every day (unless prescribed the larger weekly dose.)

## 2019-12-15 NOTE — Progress Notes (Signed)
Subjective:  Patient ID: Kathryn Young, female    DOB: Feb 15, 1946  Age: 74 y.o. MRN: [id number]  CC: Follow-up (6 month)   HPI LINNE WITTENMYER presents for routine follow up of her chronic dx including hyperlipidemia trigeminal neuralgia, osteopenia, and gastroesophageal reflux.  Patient in for follow-up of elevated cholesterol. Doing well without complaints on current medication. Denies side effects of statin including myalgia and arthralgia and nausea. Also in today for liver function testing. Currently no chest pain, shortness of breath or other cardiovascular related symptoms noted.  Patient in for follow-up of GERD. Currently asymptomatic taking  PPI daily. There is no chest pain or heartburn. No hematemesis and no melena. No dysphagia or choking. Onset is remote. Progression is stable. Complicating factors, none. Patient also having right shoulder pain.  There is a burning itch sensation that radiates down the right shoulder to the right upper arm.  Patient states that she got relief in the past from cymbalta. She would like to go back on the medication.   She is also under treatment for osteopenia.  She is taking raloxifene.  No side effects noted.  Also using it for breast cancer prevention.  Depression screen [facility] 2/9 12/15/2019 06/03/2019 07/05/2018 01/02/2018 12/19/2017  Decreased Interest 0 0 0 0 0  Down, Depressed, Hopeless 0 0 0 0 0  PHQ - 2 Score 0 0 0 0 0     Depression screen [facility] 2/9 12/15/2019 06/03/2019 07/05/2018  Decreased Interest 0 0 0  Down, Depressed, Hopeless 0 0 0  PHQ - 2 Score 0 0 0    History Clemmie has a past medical history of Brain infection, Breast mass, right, Hyperlipidemia, Hypertension, Meningitis, Neuromuscular disorder ([place]), and SUNCT (short unilateral neuralgiform headache, conjunctival inj/tear) (02/16/2017).   She has a past surgical history that includes Abdominal hysterectomy (1990); Kidney Stones; Breast surgery (Left, 1981); Breast lumpectomy with  radioactive seed localization (Right, 03/27/2016); Breast lumpectomy (Right, 2017); and Breast lumpectomy (Left).   Her family history includes Breast cancer in her mother; Cancer in her mother; Heart attack in her paternal grandfather; Other in her father; Pneumonia in her brother; Prostate cancer in her paternal grandfather.She reports that she has never smoked. She has never used smokeless tobacco. She reports that she does not drink alcohol or use drugs.    ROS Review of Systems  Constitutional: Negative.   HENT: Negative for congestion.   Eyes: Negative for visual disturbance.  Respiratory: Negative for shortness of breath.   Cardiovascular: Negative for chest pain.  Gastrointestinal: Negative for abdominal pain, constipation, diarrhea, nausea and vomiting.  Genitourinary: Negative for difficulty urinating.  Musculoskeletal: Negative for arthralgias and myalgias.  Neurological: Positive for numbness (tingling and pain RLE). Negative for headaches.  Psychiatric/Behavioral: Negative for sleep disturbance.    Objective:  BP 137/86   Pulse 77   Temp 98 F (36.7 C) (Temporal)   Ht 5' 3.5" (1.613 m)   Wt 142 lb 9.6 oz (64.7 kg)   BMI 24.86 kg/m   BP Readings from Last 3 Encounters:  12/15/19 137/86  08/14/19 136/67  07/05/18 (!) 160/79    Wt Readings from Last 3 Encounters:  12/15/19 142 lb 9.6 oz (64.7 kg)  08/14/19 145 lb (65.8 kg)  07/05/18 146 lb (66.2 kg)     Physical Exam Constitutional:      General: She is not in acute distress.    Appearance: She is well-developed.  HENT:     Head: Normocephalic and atraumatic.  Eyes:     Conjunctiva/sclera: Conjunctivae normal.     Pupils: Pupils are equal, round, and reactive to light.  Neck:     Thyroid: No thyromegaly.  Cardiovascular:     Rate and Rhythm: Normal rate and regular rhythm.     Heart sounds: Normal heart sounds. No murmur.  Pulmonary:     Effort: Pulmonary effort is normal. No respiratory distress.      Breath sounds: Normal breath sounds. No wheezing or rales.  Abdominal:     General: Bowel sounds are normal. There is no distension.     Palpations: Abdomen is soft.     Tenderness: There is no abdominal tenderness.  Musculoskeletal:        General: Normal range of motion.     Cervical back: Normal range of motion and neck supple.  Lymphadenopathy:     Cervical: No cervical adenopathy.  Skin:    General: Skin is warm and dry.  Neurological:     Mental Status: She is alert and oriented to person, place, and time.  Psychiatric:        Behavior: Behavior normal.        Thought Content: Thought content normal.        Judgment: Judgment normal.       Assessment & Plan:   There are no diagnoses linked to this encounter.     I have discontinued Hamna B. Nill's fluticasone, Vitamin D, benzonatate, and predniSONE. I am also having her maintain her raloxifene, pantoprazole, simvastatin, levocetirizine, DULoxetine, Olopatadine HCl (PATADAY OP), and Propylene Glycol (SYSTANE COMPLETE OP).  Allergies as of 12/15/2019   No Known Allergies     Medication List       Accurate as of December 15, 2019 10:46 AM. If you have any questions, ask your nurse or doctor.        STOP taking these medications   benzonatate 200 MG capsule Commonly known as: TESSALON Stopped by: Claretta Fraise, MD   fluticasone 50 MCG/ACT nasal spray Commonly known as: FLONASE Stopped by: Claretta Fraise, MD   predniSONE 10 MG (21) Tbpk tablet Commonly known as: STERAPRED UNI-PAK 21 TAB Stopped by: Claretta Fraise, MD   Vitamin D 50 MCG (2000 UT) tablet Stopped by: Claretta Fraise, MD     TAKE these medications   DULoxetine 30 MG capsule Commonly known as: CYMBALTA Take 1 capsule (30 mg total) by mouth every other day for 14 days.   levocetirizine 5 MG tablet Commonly known as: XYZAL Take 1 tablet (5 mg total) by mouth every evening.   pantoprazole 40 MG tablet Commonly known as: PROTONIX TAKE (1)  TABLET DAILY FOR STOMACH   PATADAY OP Apply to eye.   raloxifene 60 MG tablet Commonly known as: EVISTA Take 1 tablet (60 mg total) by mouth daily.   simvastatin 20 MG tablet Commonly known as: ZOCOR Take 1 tablet (20 mg total) by mouth daily.   SYSTANE COMPLETE OP Apply to eye.        Follow-up: No follow-ups on file.  Claretta Fraise, M.D.

## 2019-12-16 ENCOUNTER — Other Ambulatory Visit: Payer: Self-pay | Admitting: Family Medicine

## 2019-12-16 LAB — CMP14+EGFR
ALT: 14 IU/L (ref 0–32)
AST: 17 IU/L (ref 0–40)
Albumin/Globulin Ratio: 2 (ref 1.2–2.2)
Albumin: 4.3 g/dL (ref 3.7–4.7)
Alkaline Phosphatase: 87 IU/L (ref 39–117)
BUN/Creatinine Ratio: 14 (ref 12–28)
BUN: 13 mg/dL (ref 8–27)
Bilirubin Total: 0.5 mg/dL (ref 0.0–1.2)
CO2: 22 mmol/L (ref 20–29)
Calcium: 9.6 mg/dL (ref 8.7–10.3)
Chloride: 103 mmol/L (ref 96–106)
Creatinine, Ser: 0.9 mg/dL (ref 0.57–1.00)
GFR calc Af Amer: 73 mL/min/{1.73_m2} (ref >59)
GFR calc non Af Amer: 64 mL/min/{1.73_m2} (ref >59)
Globulin, Total: 2.1 g/dL (ref 1.5–4.5)
Glucose: 103 mg/dL — ABNORMAL HIGH (ref 65–99)
Potassium: 4 mmol/L (ref 3.5–5.2)
Sodium: 140 mmol/L (ref 134–144)
Total Protein: 6.4 g/dL (ref 6.0–8.5)

## 2019-12-16 LAB — CBC WITH DIFFERENTIAL/PLATELET
Basophils Absolute: 0 10*3/uL (ref 0.0–0.2)
Basos: 1 %
EOS (ABSOLUTE): 0.1 10*3/uL (ref 0.0–0.4)
Eos: 1 %
Hematocrit: 35.7 % (ref 34.0–46.6)
Hemoglobin: 12.3 g/dL (ref 11.1–15.9)
Immature Grans (Abs): 0 10*3/uL (ref 0.0–0.1)
Immature Granulocytes: 0 %
Lymphocytes Absolute: 2.3 10*3/uL (ref 0.7–3.1)
Lymphs: 41 %
MCH: 32.2 pg (ref 26.6–33.0)
MCHC: 34.5 g/dL (ref 31.5–35.7)
MCV: 94 fL (ref 79–97)
Monocytes Absolute: 0.5 10*3/uL (ref 0.1–0.9)
Monocytes: 8 %
Neutrophils Absolute: 2.8 10*3/uL (ref 1.4–7.0)
Neutrophils: 49 %
Platelets: 207 10*3/uL (ref 150–450)
RBC: 3.82 x10E6/uL (ref 3.77–5.28)
RDW: 12.9 % (ref 11.7–15.4)
WBC: 5.7 10*3/uL (ref 3.4–10.8)

## 2019-12-16 LAB — LIPID PANEL
Chol/HDL Ratio: 2.8 ratio (ref 0.0–4.4)
Cholesterol, Total: 173 mg/dL (ref 100–199)
HDL: 61 mg/dL (ref >39)
LDL Chol Calc (NIH): 93 mg/dL (ref 0–99)
Triglycerides: 104 mg/dL (ref 0–149)
VLDL Cholesterol Cal: 19 mg/dL (ref 5–40)

## 2019-12-16 LAB — VITAMIN D 25 HYDROXY (VIT D DEFICIENCY, FRACTURES): Vit D, 25-Hydroxy: 30.4 ng/mL (ref 30.0–100.0)

## 2019-12-16 MED ORDER — VITAMIN D (ERGOCALCIFEROL) 1.25 MG (50000 UNIT) PO CAPS: 50000.0000 [IU] | ORAL_CAPSULE | ORAL | 3 refills | Status: DC

## 2019-12-22 ENCOUNTER — Other Ambulatory Visit: Payer: Self-pay

## 2019-12-22 ENCOUNTER — Ambulatory Visit (INDEPENDENT_AMBULATORY_CARE_PROVIDER_SITE_OTHER): Payer: Medicare Other

## 2019-12-22 DIAGNOSIS — M8588 Other specified disorders of bone density and structure, other site: Secondary | ICD-10-CM

## 2019-12-22 DIAGNOSIS — Z780 Asymptomatic menopausal state: Secondary | ICD-10-CM | POA: Diagnosis not present

## 2019-12-22 DIAGNOSIS — M8589 Other specified disorders of bone density and structure, multiple sites: Secondary | ICD-10-CM | POA: Diagnosis not present

## 2019-12-30 DIAGNOSIS — Z23 Encounter for immunization: Secondary | ICD-10-CM | POA: Diagnosis not present

## 2020-01-05 ENCOUNTER — Ambulatory Visit (INDEPENDENT_AMBULATORY_CARE_PROVIDER_SITE_OTHER): Payer: Medicare Other | Admitting: *Deleted

## 2020-01-05 ENCOUNTER — Telehealth: Payer: Self-pay | Admitting: *Deleted

## 2020-01-05 ENCOUNTER — Other Ambulatory Visit: Payer: Self-pay | Admitting: Family Medicine

## 2020-01-05 DIAGNOSIS — G500 Trigeminal neuralgia: Secondary | ICD-10-CM

## 2020-01-05 DIAGNOSIS — Z0000 Encounter for general adult medical examination without abnormal findings: Secondary | ICD-10-CM | POA: Diagnosis not present

## 2020-01-05 DIAGNOSIS — R202 Paresthesia of skin: Secondary | ICD-10-CM

## 2020-01-05 MED ORDER — DULOXETINE HCL 30 MG PO CPEP: 30.0000 mg | ORAL_CAPSULE | Freq: Every day | ORAL | 1 refills | Status: DC

## 2020-01-05 NOTE — Telephone Encounter (Signed)
Left message that requested rx was sent to pharmacy and to call back with any further questions or concerns.

## 2020-01-05 NOTE — Telephone Encounter (Signed)
Please contact the patient . I increased the dose to one every day and sent it in to her pharmacy. Thanks, WS

## 2020-01-05 NOTE — Progress Notes (Signed)
MEDICARE ANNUAL WELLNESS VISIT  01/05/2020  Telephone Visit Disclaimer This Medicare AWV was conducted by telephone due to national recommendations for restrictions regarding the COVID-19 Pandemic (e.g. social distancing).  I verified, using two identifiers, that I am speaking with Kathryn Young or their authorized healthcare agent. I discussed the limitations, risks, security, and privacy concerns of performing an evaluation and management service by telephone and the potential availability of an in-person appointment in the future. The patient expressed understanding and agreed to proceed.   Subjective:  Kathryn Young is a 74 y.o. female patient of Stacks, Kathryn Gash, MD who had a Medicare Annual Wellness Visit today via telephone. Kathryn Young is retired and lives with her husband Kathryn Young. Her adult son and grandson also reside with them. She reports that she is socially active and does interact with friends/family regularly. She is minimally physically active and enjoys flea markets and yard Press photographer. She is active in her church.    Patient Care Team: Kathryn Fraise, MD as PCP - General (Family Medicine) Kathryn Bowie Alphonse Guild, MD as PCP - Cardiology (Cardiology) Kathryn Quint, MD as Referring Physician (Radiology)  Advanced Directives 01/05/2020 06/03/2019 12/19/2017 03/27/2016 03/20/2016 04/05/2015  Does Patient Have a Medical Advance Directive? Yes Yes Yes Yes Yes Yes  Type of Advance Directive Living will Living will Living will Healthcare Power of [place] -  Does patient want to make changes to medical advance directive? - No - Patient declined (No Data) No - Patient declined - -    Hospital Utilization Over the Past 12 Months: # of hospitalizations or ER visits: 0 # of surgeries: 0  Review of Systems    Patient reports that her overall health is unchanged compared to last year.  History obtained from chart review and the patient.  Patient Reported Readings (BP,  Pulse, CBG, Weight, etc) none  Pain Assessment Pain : No/denies pain     Current Medications & Allergies (verified) Allergies as of 01/05/2020   No Known Allergies     Medication List       Accurate as of January 05, 2020  8:51 AM. If you have any questions, ask your nurse or doctor.        calcium citrate-vitamin D 315-200 MG-UNIT tablet Commonly known as: CITRACAL+D Take 1 tablet by mouth 2 (two) times daily.   DULoxetine 30 MG capsule Commonly known as: CYMBALTA Take 1 capsule (30 mg total) by mouth every other day for 14 days.   gabapentin 300 MG capsule Commonly known as: NEURONTIN Take 2 capsules (600 mg total) by mouth at bedtime.   levocetirizine 5 MG tablet Commonly known as: XYZAL Take 1 tablet (5 mg total) by mouth every evening.   pantoprazole 40 MG tablet Commonly known as: PROTONIX TAKE (1) TABLET DAILY FOR STOMACH   PATADAY OP Apply to eye.   raloxifene 60 MG tablet Commonly known as: EVISTA Take 1 tablet (60 mg total) by mouth daily.   simvastatin 20 MG tablet Commonly known as: ZOCOR Take 1 tablet (20 mg total) by mouth daily.   SYSTANE COMPLETE OP Apply to eye.   Vitamin D (Ergocalciferol) 1.25 MG (50000 UNIT) Caps capsule Commonly known as: DRISDOL Take 1 capsule (50,000 Units total) by mouth every 7 (seven) days.   Zinc 50 MG Tabs Take by mouth.       History (reviewed): Past Medical History:  Diagnosis Date  . Brain infection    Age 64  .  Breast mass, right   . Hyperlipidemia   . Hypertension    no meds  . Meningitis    Age 89  . Neuromuscular disorder ([place])    right shoulder with burning down arm  . SUNCT (short unilateral neuralgiform headache, conjunctival inj/tear) 02/16/2017   Past Surgical History:  Procedure Laterality Date  . ABDOMINAL HYSTERECTOMY  1990  . BREAST LUMPECTOMY Right 2017  . BREAST LUMPECTOMY Left    pt states she is not sure of when or where believes it was in [place].  Marland Kitchen BREAST  LUMPECTOMY WITH RADIOACTIVE SEED LOCALIZATION Right 03/27/2016   Procedure: RIGHT BREAST LUMPECTOMYAFTER RADIOACTIVE SEED LOCALIZATION;  Surgeon: Kathryn Confer, MD;  Location: [facility];  Service: General;  Laterality: Right;  RIGHT BREAST LUMPECTOMYAFTER RADIOACTIVE SEED LOCALIZATION  . BREAST SURGERY Left 1981   benign cysts removed  . Kidney Stones     Family History  Problem Relation Age of Onset  . Cancer Mother        Breast  . Breast cancer Mother   . Other Father        Drowned  . Prostate cancer Paternal Grandfather   . Heart attack Paternal Grandfather   . Pneumonia Brother   . Hypertension Son   . Hyperlipidemia Son    Social History   Socioeconomic History  . Marital status: Married    Spouse name: Kathryn Young  . Number of children: 1  . Years of education: 47  . Highest education level: High school graduate  Occupational History  . 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 Smoking status: Never Smoker  . Smokeless tobacco: Never Used  Substance and Sexual Activity  . Alcohol use: No  . Drug use: No  . Sexual activity: Yes    Partners: Male    Birth control/protection: Surgical    Comment: Married  Other Topics Concern  . Not on file  Social History Narrative   Lives at home w/ her husband and grandson   Right-handed   Caffeine: 2 cups of coffee and tea throughout the day   Social Determinants of Health   Financial Resource Strain: Low Risk   . Difficulty of Paying Living Expenses: Not hard at all  Food Insecurity: No Food Insecurity  . Worried About Charity fundraiser in the Last Year: Never true  . Ran Out of Food in the Last Year: Never true  Transportation Needs: No Transportation Needs  . Lack of Transportation (Medical): No  . Lack of Transportation (Non-Medical): No  Physical Activity: Sufficiently Active  . Days of Exercise per Week: 3 days  . Minutes of Exercise per Session: 50 min  Stress: No Stress Concern  Present  . Feeling of Stress : Not at all  Social Connections: Not Isolated  . Frequency of Communication with Friends and Family: More than three times a week  . Frequency of Social Gatherings with Friends and Family: More than three times a week  . Attends Religious Services: More than 4 times per year  . Active Member of Clubs or Organizations: Yes  . Attends Archivist Meetings: More than 4 times per year  . Marital Status: Married    Activities of Daily Living In your present state of health, do you have any difficulty performing the following activities: 01/05/2020 06/03/2019  Hearing? N N  Vision? Y Y  Comment cataracts has cataracts and has to follow up in 6 months to decide  if she needs to have them removed  Difficulty concentrating or making decisions? N N  Walking or climbing stairs? N N  Dressing or bathing? N N  Doing errands, shopping? N N  Preparing Food and eating ? N N  Using the Toilet? N N  In the past six months, have you accidently leaked urine? N N  Do you have problems with loss of bowel control? N N  Managing your Medications? N N  Managing your Finances? N N  Housekeeping or managing your Housekeeping? N N  Some recent data might be hidden    Patient Education/ Literacy How often do you need to have someone help you when you read instructions, pamphlets, or other written materials from your doctor or pharmacy?: 1 - Never What is the last grade level you completed in school?: 12th  Exercise Current Exercise Habits: The patient does not participate in regular exercise at present, Exercise limited by: None identified  Diet Patient reports consuming 3 meals a day and 2 snack(s) a day Patient reports that her primary diet is: Regular Patient reports that she does have regular access to food.   Depression Screen PHQ 2/9 Scores 01/05/2020 12/15/2019 06/03/2019 07/05/2018 01/02/2018 12/19/2017 12/13/2017  PHQ - 2 Score 0 0 0 0 0 0 0     Fall Risk Fall  Risk  01/05/2020 12/15/2019 06/03/2019 07/05/2018 01/02/2018  Falls in the past year? 0 0 0 No No  Number falls in past yr: - 0 - - -  Injury with Fall? - 0 - - -  Risk for fall due to : - No Fall Risks - - -  Follow up - Falls evaluation completed - - -     Objective:  Kathryn Young seemed alert and oriented and she participated appropriately during our telephone visit.  Blood Pressure Weight BMI  BP Readings from Last 3 Encounters:  12/15/19 137/86  08/14/19 136/67  07/05/18 (!) 160/79   Wt Readings from Last 3 Encounters:  12/15/19 142 lb 9.6 oz (64.7 kg)  08/14/19 145 lb (65.8 kg)  07/05/18 146 lb (66.2 kg)   BMI Readings from Last 1 Encounters:  12/15/19 24.86 kg/m    *Unable to obtain current vital signs, weight, and BMI due to telephone visit type  Hearing/Vision  . Bethaney did not seem to have difficulty with hearing/understanding during the telephone conversation . Reports that she has not had a formal eye exam by an eye care professional within the past year . Reports that she has not had a formal hearing evaluation within the past year *Unable to fully assess hearing and vision during telephone visit type  Cognitive Function: 6CIT Screen 01/05/2020 06/03/2019  What Year? 0 points 0 points  What month? 0 points 0 points  What time? 0 points 0 points  Count back from 20 0 points 0 points  Months in reverse 0 points 0 points  Repeat phrase 0 points 0 points  Total Score 0 0   (Normal:0-7, Significant for Dysfunction: >8)  Normal Cognitive Function Screening: Yes   Immunization & Health Maintenance Record Immunization History  Administered Date(s) Administered  . Influenza, High Dose Seasonal PF 07/31/2016, 07/28/2017  . Influenza,inj,Quad PF,6-35 Mos 07/02/2019  . Influenza-Unspecified 07/25/2014, 07/25/2015, 07/29/2018  . Moderna SARS-COVID-2 Vaccination 12/30/2019  . Pneumococcal Conjugate-13 02/18/2016  . Pneumococcal Polysaccharide-23 09/16/2011  . Tdap  04/06/2010  . Zoster 11/21/2007    Health Maintenance  Topic Date Due  . Samul Dada  04/06/2020  .  MAMMOGRAM  11/26/2021  . COLONOSCOPY  07/07/2026  . INFLUENZA VACCINE  Completed  . DEXA SCAN  Completed  . Hepatitis C Screening  Completed  . PNA vac Low Risk Adult  Completed       Assessment  This is a routine wellness examination for Kathryn Young.  Health Maintenance: Due or Overdue There are no preventive care reminders to display for this patient.  Kathryn Young does not need a referral for Community Assistance: Care Management:   no Social Work:    no Prescription Assistance:  no Nutrition/Diabetes Education:  no   Plan:  Personalized Goals Goals Addressed            This Visit's Progress   . Patient Stated       01/05/2020 AWV Goal: Exercise for General Health   Patient will verbalize understanding of the benefits of increased physical activity:  Exercising regularly is important. It will improve your overall fitness, flexibility, and endurance.  Regular exercise also will improve your overall health. It can help you control your weight, reduce stress, and improve your bone density.  Over the next year, patient will increase physical activity as tolerated with a goal of at least 150 minutes of moderate physical activity per week.   You can tell that you are exercising at a moderate intensity if your heart starts beating faster and you start breathing faster but can still hold a conversation.  Moderate-intensity exercise ideas include:  Walking 1 mile (1.6 km) in about 15 minutes  Biking  Hiking  Golfing  Dancing  Water aerobics  Patient will verbalize understanding of everyday activities that increase physical activity by providing examples like the following: ? Yard work, such as: ? Pushing a Conservation officer, nature ? Raking and bagging leaves ? Washing your car ? Pushing a stroller ? Shoveling snow ? Gardening ? Washing windows or  floors  Patient will be able to explain general safety guidelines for exercising:   Before you start a new exercise program, talk with your health care provider.  Do not exercise so much that you hurt yourself, feel dizzy, or get very short of breath.  Wear comfortable clothes and wear shoes with good support.  Drink plenty of water while you exercise to prevent dehydration or heat stroke.  Work out until your breathing and your heartbeat get faster.       Personalized Health Maintenance & Screening Recommendations    Lung Cancer Screening Recommended: no (Low Dose CT Chest recommended if Age 53-80 years, 30 pack-year currently smoking OR have quit w/in past 15 years) Hepatitis C Screening recommended: no HIV Screening recommended: no  Advanced Directives: Written information was not prepared per patient's request.  Referrals & Orders No orders of the defined types were placed in this encounter.   Follow-up Plan . Follow-up with Kathryn Fraise, MD as planned .    I have personally reviewed and noted the following in the patient's chart:   . Medical and social history . Use of alcohol, tobacco or illicit drugs  . Current medications and supplements . Functional ability and status . Nutritional status . Physical activity . Advanced directives . List of other physicians . Hospitalizations, surgeries, and ER visits in previous 12 months . Vitals . Screenings to include cognitive, depression, and falls . Referrals and appointments  In addition, I have reviewed and discussed with Kathryn Young certain preventive protocols, quality metrics, and best practice recommendations. A written personalized care plan for  preventive services as well as general preventive health recommendations is available and can be mailed to the patient at her request.      Baldomero Lamy, LPN  [id number]

## 2020-01-27 DIAGNOSIS — Z23 Encounter for immunization: Secondary | ICD-10-CM | POA: Diagnosis not present

## 2020-02-12 DIAGNOSIS — H1045 Other chronic allergic conjunctivitis: Secondary | ICD-10-CM | POA: Diagnosis not present

## 2020-02-12 DIAGNOSIS — H2513 Age-related nuclear cataract, bilateral: Secondary | ICD-10-CM | POA: Diagnosis not present

## 2020-02-12 DIAGNOSIS — H04123 Dry eye syndrome of bilateral lacrimal glands: Secondary | ICD-10-CM | POA: Diagnosis not present

## 2020-03-12 DIAGNOSIS — H2511 Age-related nuclear cataract, right eye: Secondary | ICD-10-CM | POA: Diagnosis not present

## 2020-03-12 DIAGNOSIS — H25811 Combined forms of age-related cataract, right eye: Secondary | ICD-10-CM | POA: Diagnosis not present

## 2020-03-26 DIAGNOSIS — H25812 Combined forms of age-related cataract, left eye: Secondary | ICD-10-CM | POA: Diagnosis not present

## 2020-05-06 ENCOUNTER — Encounter: Payer: Self-pay | Admitting: Cardiology

## 2020-05-06 ENCOUNTER — Ambulatory Visit (INDEPENDENT_AMBULATORY_CARE_PROVIDER_SITE_OTHER): Payer: Medicare Other | Admitting: Cardiology

## 2020-05-06 ENCOUNTER — Other Ambulatory Visit: Payer: Self-pay

## 2020-05-06 VITALS — BP 152/80 | HR 80 | Ht 63.0 in | Wt 141.0 lb

## 2020-05-06 DIAGNOSIS — I340 Nonrheumatic mitral (valve) insufficiency: Secondary | ICD-10-CM

## 2020-05-06 DIAGNOSIS — R030 Elevated blood-pressure reading, without diagnosis of hypertension: Secondary | ICD-10-CM | POA: Diagnosis not present

## 2020-05-06 NOTE — Progress Notes (Signed)
Clinical Summary Ms. Hasten is a 74 y.o.female seen today for follow up of the following medical problems.   1. Mitral regurgitation - reports told 25 years ago had heart murmur   02/2018 echo: LVEF 27-06%, grade I diastolic dysfunction, moderate MR, bilateral MV leaflet prolapse  08/2019 echo: LVEF 60-65%, indet DDx, mild to mod MR - no recent symptoms  Past Medical History:  Diagnosis Date  . Brain infection    Age 89  . Breast mass, right   . Hyperlipidemia   . Hypertension    no meds  . Meningitis    Age 72  . Neuromuscular disorder ([place])    right shoulder with burning down arm  . SUNCT (short unilateral neuralgiform headache, conjunctival inj/tear) 02/16/2017     No Known Allergies   Current Outpatient Medications  Medication Sig Dispense Refill  . calcium citrate-vitamin D (CITRACAL+D) 315-200 MG-UNIT tablet Take 1 tablet by mouth 2 (two) times daily.    . DULoxetine (CYMBALTA) 30 MG capsule Take 1 capsule (30 mg total) by mouth daily. 90 capsule 1  . gabapentin (NEURONTIN) 300 MG capsule Take 2 capsules (600 mg total) by mouth at bedtime. 180 capsule 0  . levocetirizine (XYZAL) 5 MG tablet Take 1 tablet (5 mg total) by mouth every evening. 90 tablet 3  . Olopatadine HCl (PATADAY OP) Apply to eye.    . pantoprazole (PROTONIX) 40 MG tablet TAKE (1) TABLET DAILY FOR STOMACH 90 tablet 3  . Propylene Glycol (SYSTANE COMPLETE OP) Apply to eye.    . raloxifene (EVISTA) 60 MG tablet Take 1 tablet (60 mg total) by mouth daily. 90 tablet 3  . simvastatin (ZOCOR) 20 MG tablet Take 1 tablet (20 mg total) by mouth daily. 90 tablet 3  . Vitamin D, Ergocalciferol, (DRISDOL) 1.25 MG (50000 UNIT) CAPS capsule Take 1 capsule (50,000 Units total) by mouth every 7 (seven) days. 13 capsule 3  . Zinc 50 MG TABS Take by mouth.     No current facility-administered medications for this visit.     Past Surgical History:  Procedure Laterality Date  . ABDOMINAL HYSTERECTOMY   1990  . BREAST LUMPECTOMY Right 2017  . BREAST LUMPECTOMY Left    pt states she is not sure of when or where believes it was in [place].  Marland Kitchen BREAST LUMPECTOMY WITH RADIOACTIVE SEED LOCALIZATION Right 03/27/2016   Procedure: RIGHT BREAST LUMPECTOMYAFTER RADIOACTIVE SEED LOCALIZATION;  Surgeon: Jackolyn Confer, MD;  Location: [facility];  Service: General;  Laterality: Right;  RIGHT BREAST LUMPECTOMYAFTER RADIOACTIVE SEED LOCALIZATION  . BREAST SURGERY Left 1981   benign cysts removed  . Kidney Stones       No Known Allergies    Family History  Problem Relation Age of Onset  . Cancer Mother        Breast  . Breast cancer Mother   . Other Father        Drowned  . Prostate cancer Paternal Grandfather   . Heart attack Paternal Grandfather   . Pneumonia Brother   . Hypertension Son   . Hyperlipidemia Son      Social History Ms. Way reports that she has never smoked. She has never used smokeless tobacco. Ms. Hulen reports no history of alcohol use.   Review of Systems CONSTITUTIONAL: No weight loss, fever, chills, weakness or fatigue.  HEENT: Eyes: No visual loss, blurred vision, double vision or yellow sclerae.No hearing loss, sneezing, congestion, runny nose or sore  throat.  SKIN: No rash or itching.  CARDIOVASCULAR: per hpi RESPIRATORY: No shortness of breath, cough or sputum.  GASTROINTESTINAL: No anorexia, nausea, vomiting or diarrhea. No abdominal pain or blood.  GENITOURINARY: No burning on urination, no polyuria NEUROLOGICAL: No headache, dizziness, syncope, paralysis, ataxia, numbness or tingling in the extremities. No change in bowel or bladder control.  MUSCULOSKELETAL: No muscle, back pain, joint pain or stiffness.  LYMPHATICS: No enlarged nodes. No history of splenectomy.  PSYCHIATRIC: No history of depression or anxiety.  ENDOCRINOLOGIC: No reports of sweating, cold or heat intolerance. No polyuria or polydipsia.  Marland Kitchen   Physical  Examination Today's Vitals   05/06/20 1300  BP: (!) 152/80  Pulse: 80  SpO2: 97%  Weight: 141 lb (64 kg)  Height: 5\' 3"  (1.6 m)   Body mass index is 24.98 kg/m.  Gen: resting comfortably, no acute distress HEENT: no scleral icterus, pupils equal round and reactive, no palptable cervical adenopathy,  CV: RRR, 2/6 MR, no jvd Resp: Clear to auscultation bilaterally GI: abdomen is soft, non-tender, non-distended, normal bowel sounds, no hepatosplenomegaly MSK: extremities are warm, no edema.  Skin: warm, no rash Neuro:  no focal deficits Psych: appropriate affect   Diagnostic Studies 02/2018 echo Study Conclusions  - Left ventricle: The cavity size was normal. Wall thickness was increased in a pattern of mild LVH. Systolic function was normal. The estimated ejection fraction was in the range of 55% to 60%. Wall motion was normal; there were no regional wall motion abnormalities. Doppler parameters are consistent with abnormal left ventricular relaxation (grade 1 diastolic dysfunction). Doppler parameters are consistent with indeterminate ventricular filling pressure. - Mitral valve: Mild bileaflet prolapse. Mildly thickened leaflets . There was moderate regurgitation. - Left atrium: The atrium was severely dilated. - Tricuspid valve: There was mild regurgitation.   08/2019 echo IMPRESSIONS    1. Left ventricular ejection fraction, by visual estimation, is 60 to  65%. The left ventricle has normal function. There is mildly increased  left ventricular hypertrophy.  2. Left ventricular diastolic parameters are indeterminate.  3. Global right ventricle has normal systolic function.The right  ventricular size is normal. No increase in right ventricular wall  thickness.  4. Left atrial size was mildly dilated.  5. Right atrial size was normal.  6. The mitral valve is abnormal. Mild to moderate mitral valve  regurgitation. No evidence of mitral  stenosis.  7. 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 The MR jet is eccentric  and posterior, which may lead to underestimation. The MV/AV TVI ratio is 1  suggestiong mild to moderate MR.  8. The tricuspid valve is normal in structure. Tricuspid valve  regurgitation is mild.  9. The aortic valve is tricuspid. Aortic valve regurgitation is not  visualized. No evidence of aortic valve sclerosis or stenosis.  10. The pulmonic valve was not well visualized. Pulmonic valve  regurgitation is not visualized.  11. Normal pulmonary artery systolic pressure.  12. The inferior vena cava is normal in size with greater than 50%  respiratory variability, suggesting right atrial pressure of 3 mmHg.  13. 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    Assessment and Plan   1. Mitral regurgitation - mild to moderate by echo, no symptoms - continue to monitor, would hold on echo this year and repeat fall of 2022  2. Elevated bp - home bp's are at goal, repeat manual bp 134/80 - continue to monitor at this time  EKG shows  NSR    Arnoldo Lenis, M.D.

## 2020-05-06 NOTE — Patient Instructions (Signed)
Medication Instructions:   Your physician recommends that you continue on your current medications as directed. Please refer to the Current Medication list given to you today.  *If you need a refill on your cardiac medications before your next appointment, please call your pharmacy*   Lab Work: None today  If you have labs (blood work) drawn today and your tests are completely normal, you will receive your results only by: . MyChart Message (if you have MyChart) OR . A paper copy in the mail If you have any lab test that is abnormal or we need to change your treatment, we will call you to review the results.   Testing/Procedures: None today   Follow-Up: At [facility], you and your health needs are our priority.  As part of our continuing mission to provide you with exceptional heart care, we have created designated Provider Care Teams.  These Care Teams include your primary Cardiologist (physician) and Advanced Practice Providers (APPs -  Physician Assistants and Nurse Practitioners) who all work together to provide you with the care you need, when you need it.  We recommend signing up for the patient portal called "MyChart".  Sign up information is provided on this After Visit Summary.  MyChart is used to connect with patients for Virtual Visits (Telemedicine).  Patients are able to view lab/test results, encounter notes, upcoming appointments, etc.  Non-urgent messages can be sent to your provider as well.   To learn more about what you can do with MyChart, go to [email or web address].    Your next appointment:   12 month(s)  The format for your next appointment:   In Person  Provider:   Jonathan Branch, MD   Other Instructions None      Thank you for choosing [facility] !         

## 2020-06-17 ENCOUNTER — Encounter: Payer: Self-pay | Admitting: Family Medicine

## 2020-06-17 ENCOUNTER — Other Ambulatory Visit: Payer: Self-pay

## 2020-06-17 ENCOUNTER — Ambulatory Visit (INDEPENDENT_AMBULATORY_CARE_PROVIDER_SITE_OTHER): Payer: Medicare Other | Admitting: Family Medicine

## 2020-06-17 VITALS — BP 134/67 | HR 69 | Temp 97.2°F | Resp 20 | Ht 63.0 in | Wt 137.1 lb

## 2020-06-17 DIAGNOSIS — M8589 Other specified disorders of bone density and structure, multiple sites: Secondary | ICD-10-CM

## 2020-06-17 DIAGNOSIS — K219 Gastro-esophageal reflux disease without esophagitis: Secondary | ICD-10-CM | POA: Diagnosis not present

## 2020-06-17 DIAGNOSIS — E559 Vitamin D deficiency, unspecified: Secondary | ICD-10-CM | POA: Diagnosis not present

## 2020-06-17 DIAGNOSIS — G500 Trigeminal neuralgia: Secondary | ICD-10-CM | POA: Diagnosis not present

## 2020-06-17 DIAGNOSIS — E782 Mixed hyperlipidemia: Secondary | ICD-10-CM | POA: Diagnosis not present

## 2020-06-17 DIAGNOSIS — R202 Paresthesia of skin: Secondary | ICD-10-CM | POA: Diagnosis not present

## 2020-06-17 MED ORDER — GABAPENTIN 300 MG PO CAPS: 600.0000 mg | ORAL_CAPSULE | Freq: Every day | ORAL | 0 refills | Status: DC

## 2020-06-17 MED ORDER — VITAMIN D (ERGOCALCIFEROL) 1.25 MG (50000 UNIT) PO CAPS: 50000.0000 [IU] | ORAL_CAPSULE | ORAL | 3 refills | Status: DC

## 2020-06-17 MED ORDER — SIMVASTATIN 20 MG PO TABS: 20.0000 mg | ORAL_TABLET | Freq: Every day | ORAL | 3 refills | Status: DC

## 2020-06-17 MED ORDER — CELECOXIB 200 MG PO CAPS: 200.0000 mg | ORAL_CAPSULE | Freq: Every day | ORAL | 5 refills | Status: DC

## 2020-06-17 MED ORDER — RALOXIFENE HCL 60 MG PO TABS: 60.0000 mg | ORAL_TABLET | Freq: Every day | ORAL | 1 refills | Status: DC

## 2020-06-17 MED ORDER — DULOXETINE HCL 30 MG PO CPEP: 30.0000 mg | ORAL_CAPSULE | Freq: Every day | ORAL | 1 refills | Status: DC

## 2020-06-17 MED ORDER — PANTOPRAZOLE SODIUM 40 MG PO TBEC: DELAYED_RELEASE_TABLET | ORAL | 3 refills | Status: DC

## 2020-06-17 NOTE — Progress Notes (Signed)
Subjective:  Patient ID: Kathryn Young, female    DOB: 1945/12/31  Age: 74 y.o. MRN: [id number]  CC: Medical Management of Chronic Issues   HPI Kathryn Young presents for follow-up of elevated cholesterol. Doing well without complaints on current medication. Denies side effects of statin including myalgia and arthralgia and nausea. Also in today for liver function testing. Currently no chest pain, shortness of breath or other cardiovascular related symptoms noted.  Patient in for follow-up of GERD. Currently asymptomatic taking  PPI daily. There is no chest pain or heartburn. No hematemesis and no melena. No dysphagia or choking. Onset is remote. Progression is stable. Complicating factors, none.  Kathryn Young saw cardiology for her murmur which is stable.  Kathryn Young recently had cataract surgery.  Kathryn Young has had her Covid vaccines.  History Kathryn Young has a past medical history of Brain infection, Breast mass, right, Hyperlipidemia, Hypertension, Meningitis, Neuromuscular disorder ([place]), and SUNCT (short unilateral neuralgiform headache, conjunctival inj/tear) (02/16/2017).   Kathryn Young has a past surgical history that includes Abdominal hysterectomy (1990); Kidney Stones; Breast surgery (Left, 1981); Breast lumpectomy with radioactive seed localization (Right, 03/27/2016); Breast lumpectomy (Right, 2017); Breast lumpectomy (Left); and Cataract extraction (Bilateral, 2021).   Her family history includes Breast cancer in her mother; Cancer in her mother; Heart attack in her paternal grandfather; Hyperlipidemia in her son; Hypertension in her son; Other in her father; Pneumonia in her brother; Prostate cancer in her paternal grandfather.Kathryn Young reports that Kathryn Young has never smoked. Kathryn Young has never used smokeless tobacco. Kathryn Young reports that Kathryn Young does not drink alcohol and does not use drugs.  Current Outpatient Medications on File Prior to Visit  Medication Sig Dispense Refill  . calcium citrate-vitamin D (CITRACAL+D) 315-200 MG-UNIT  tablet Take 1 tablet by mouth 2 (two) times daily.    . Olopatadine HCl (PATADAY OP) Apply to eye.    Marland Kitchen Propylene Glycol (SYSTANE COMPLETE OP) Apply to eye.    Marland Kitchen Zinc 50 MG TABS Take by mouth.     No current facility-administered medications on file prior to visit.    ROS Review of Systems  Constitutional: Negative.   HENT: Negative for congestion.   Eyes: Negative for visual disturbance.  Respiratory: Negative for shortness of breath.   Cardiovascular: Negative for chest pain.  Gastrointestinal: Negative for abdominal pain, constipation, diarrhea, nausea and vomiting.  Genitourinary: Negative for difficulty urinating.  Musculoskeletal: Negative for arthralgias and myalgias.  Neurological: Negative for headaches.  Psychiatric/Behavioral: Negative for sleep disturbance.    Objective:  BP 134/67   Pulse 69   Temp (!) 97.2 F (36.2 C) (Temporal)   Resp 20   Ht _0  (1.6 m)   Wt 137 lb 2 oz (62.2 kg)   SpO2 100%   BMI 24.29 kg/m   BP Readings from Last 3 Encounters:  06/17/20 134/67  05/06/20 (!) 152/80  12/15/19 137/86    Wt Readings from Last 3 Encounters:  06/17/20 137 lb 2 oz (62.2 kg)  05/06/20 141 lb (64 kg)  12/15/19 142 lb 9.6 oz (64.7 kg)     Physical Exam Constitutional:      General: Kathryn Young is not in acute distress.    Appearance: Kathryn Young is well-developed.  HENT:     Head: Normocephalic and atraumatic.  Eyes:     Conjunctiva/sclera: Conjunctivae normal.     Pupils: Pupils are equal, round, and reactive to light.  Neck:     Thyroid: No thyromegaly.  Cardiovascular:     Rate and Rhythm:  Normal rate and regular rhythm.     Heart sounds: Normal heart sounds. No murmur heard.   Pulmonary:     Effort: Pulmonary effort is normal. No respiratory distress.     Breath sounds: Normal breath sounds. No wheezing or rales.  Abdominal:     General: Bowel sounds are normal. There is no distension.     Palpations: Abdomen is soft.     Tenderness: There is no  abdominal tenderness.  Musculoskeletal:        General: Normal range of motion.     Cervical back: Normal range of motion and neck supple.  Lymphadenopathy:     Cervical: No cervical adenopathy.  Skin:    General: Skin is warm and dry.  Neurological:     Mental Status: Kathryn Young is alert and oriented to person, place, and time.  Psychiatric:        Behavior: Behavior normal.        Thought Content: Thought content normal.        Judgment: Judgment normal.     No results found for: HGBA1C  Lab Results  Component Value Date   WBC 5.8 06/17/2020   HGB 12.8 06/17/2020   HCT 36.0 06/17/2020   PLT 222 06/17/2020   GLUCOSE 109 (H) 06/17/2020   CHOL 153 06/17/2020   TRIG 100 06/17/2020   HDL 51 06/17/2020   LDLCALC 84 06/17/2020   ALT 10 06/17/2020   AST 12 06/17/2020   NA 143 06/17/2020   K 3.9 06/17/2020   CL 105 06/17/2020   CREATININE 0.82 06/17/2020   BUN 12 06/17/2020   CO2 26 06/17/2020   TSH 1.750 01/02/2018    MM 3D SCREEN BREAST BILATERAL  Result Date: 11/29/2019 CLINICAL DATA:  Screening. EXAM: DIGITAL SCREENING BILATERAL MAMMOGRAM WITH TOMO AND CAD COMPARISON:  Previous exam(s). ACR Breast Density Category c: The breast tissue is heterogeneously dense, which may obscure small masses. FINDINGS: There are no findings suspicious for malignancy. Images were processed with CAD. IMPRESSION: No mammographic evidence of malignancy. A result letter of this screening mammogram will be mailed directly to the patient. RECOMMENDATION: Screening mammogram in one year. (Code:SM-B-01Y) BI-RADS CATEGORY  1: Negative. Electronically Signed   By: Abelardo Diesel M.D.   On: 11/29/2019 15:52    Assessment & Plan:   Kathryn Young was seen today for medical management of chronic issues.  Diagnoses and all orders for this visit:  Vitamin D deficiency -     Vitamin D, Ergocalciferol, (DRISDOL) 1.25 MG (50000 UNIT) CAPS capsule; Take 1 capsule (50,000 Units total) by mouth every 7 (seven) days. -      CBC with Differential/Platelet -     CMP14+EGFR -     VITAMIN D 25 Hydroxy (Vit-D Deficiency, Fractures)  Osteopenia of multiple sites -     raloxifene (EVISTA) 60 MG tablet; Take 1 tablet (60 mg total) by mouth daily. -     CBC with Differential/Platelet -     CMP14+EGFR  Notalgia paresthetica -     DULoxetine (CYMBALTA) 30 MG capsule; Take 1 capsule (30 mg total) by mouth daily. -     gabapentin (NEURONTIN) 300 MG capsule; Take 2 capsules (600 mg total) by mouth at bedtime. -     CBC with Differential/Platelet -     CMP14+EGFR  Trigeminal neuralgia of right side of face -     DULoxetine (CYMBALTA) 30 MG capsule; Take 1 capsule (30 mg total) by mouth daily. -  CBC with Differential/Platelet -     CMP14+EGFR  Gastroesophageal reflux disease without esophagitis -     pantoprazole (PROTONIX) 40 MG tablet; TAKE (1) TABLET DAILY FOR STOMACH -     CBC with Differential/Platelet -     CMP14+EGFR  Mixed hyperlipidemia -     simvastatin (ZOCOR) 20 MG tablet; Take 1 tablet (20 mg total) by mouth daily. -     CBC with Differential/Platelet -     CMP14+EGFR -     Lipid panel  Other orders -     celecoxib (CELEBREX) 200 MG capsule; Take 1 capsule (200 mg total) by mouth daily. With food   I am having Kathryn Young start on celecoxib. I am also having her maintain her Olopatadine HCl (PATADAY OP), Propylene Glycol (SYSTANE COMPLETE OP), calcium citrate-vitamin D, Zinc, raloxifene, DULoxetine, gabapentin, pantoprazole, simvastatin, and Vitamin D (Ergocalciferol).  Meds ordered this encounter  Medications  . raloxifene (EVISTA) 60 MG tablet    Sig: Take 1 tablet (60 mg total) by mouth daily.    Dispense:  90 tablet    Refill:  1  . DULoxetine (CYMBALTA) 30 MG capsule    Sig: Take 1 capsule (30 mg total) by mouth daily.    Dispense:  90 capsule    Refill:  1  . gabapentin (NEURONTIN) 300 MG capsule    Sig: Take 2 capsules (600 mg total) by mouth at bedtime.    Dispense:  180  capsule    Refill:  0  . pantoprazole (PROTONIX) 40 MG tablet    Sig: TAKE (1) TABLET DAILY FOR STOMACH    Dispense:  90 tablet    Refill:  3  . simvastatin (ZOCOR) 20 MG tablet    Sig: Take 1 tablet (20 mg total) by mouth daily.    Dispense:  90 tablet    Refill:  3  . Vitamin D, Ergocalciferol, (DRISDOL) 1.25 MG (50000 UNIT) CAPS capsule    Sig: Take 1 capsule (50,000 Units total) by mouth every 7 (seven) days.    Dispense:  13 capsule    Refill:  3  . celecoxib (CELEBREX) 200 MG capsule    Sig: Take 1 capsule (200 mg total) by mouth daily. With food    Dispense:  30 capsule    Refill:  5     Follow-up: No follow-ups on file.  Claretta Fraise, M.D.

## 2020-06-18 LAB — CBC WITH DIFFERENTIAL/PLATELET
Basophils Absolute: 0 10*3/uL (ref 0.0–0.2)
Basos: 0 %
EOS (ABSOLUTE): 0.1 10*3/uL (ref 0.0–0.4)
Eos: 1 %
Hematocrit: 36 % (ref 34.0–46.6)
Hemoglobin: 12.8 g/dL (ref 11.1–15.9)
Immature Grans (Abs): 0 10*3/uL (ref 0.0–0.1)
Immature Granulocytes: 0 %
Lymphocytes Absolute: 2.3 10*3/uL (ref 0.7–3.1)
Lymphs: 40 %
MCH: 34.3 pg — ABNORMAL HIGH (ref 26.6–33.0)
MCHC: 35.6 g/dL (ref 31.5–35.7)
MCV: 97 fL (ref 79–97)
Monocytes Absolute: 0.4 10*3/uL (ref 0.1–0.9)
Monocytes: 7 %
Neutrophils Absolute: 3 10*3/uL (ref 1.4–7.0)
Neutrophils: 52 %
Platelets: 222 10*3/uL (ref 150–450)
RBC: 3.73 x10E6/uL — ABNORMAL LOW (ref 3.77–5.28)
RDW: 12.5 % (ref 11.7–15.4)
WBC: 5.8 10*3/uL (ref 3.4–10.8)

## 2020-06-18 LAB — CMP14+EGFR
ALT: 10 IU/L (ref 0–32)
AST: 12 IU/L (ref 0–40)
Albumin/Globulin Ratio: 1.8 (ref 1.2–2.2)
Albumin: 4.2 g/dL (ref 3.7–4.7)
Alkaline Phosphatase: 96 IU/L (ref 48–121)
BUN/Creatinine Ratio: 15 (ref 12–28)
BUN: 12 mg/dL (ref 8–27)
Bilirubin Total: 0.4 mg/dL (ref 0.0–1.2)
CO2: 26 mmol/L (ref 20–29)
Calcium: 8.9 mg/dL (ref 8.7–10.3)
Chloride: 105 mmol/L (ref 96–106)
Creatinine, Ser: 0.82 mg/dL (ref 0.57–1.00)
GFR calc Af Amer: 82 mL/min/{1.73_m2} (ref >59)
GFR calc non Af Amer: 71 mL/min/{1.73_m2} (ref >59)
Globulin, Total: 2.4 g/dL (ref 1.5–4.5)
Glucose: 109 mg/dL — ABNORMAL HIGH (ref 65–99)
Potassium: 3.9 mmol/L (ref 3.5–5.2)
Sodium: 143 mmol/L (ref 134–144)
Total Protein: 6.6 g/dL (ref 6.0–8.5)

## 2020-06-18 LAB — VITAMIN D 25 HYDROXY (VIT D DEFICIENCY, FRACTURES): Vit D, 25-Hydroxy: 44.5 ng/mL (ref 30.0–100.0)

## 2020-06-18 LAB — LIPID PANEL
Chol/HDL Ratio: 3 ratio (ref 0.0–4.4)
Cholesterol, Total: 153 mg/dL (ref 100–199)
HDL: 51 mg/dL (ref >39)
LDL Chol Calc (NIH): 84 mg/dL (ref 0–99)
Triglycerides: 100 mg/dL (ref 0–149)
VLDL Cholesterol Cal: 18 mg/dL (ref 5–40)

## 2020-06-20 ENCOUNTER — Encounter: Payer: Self-pay | Admitting: Family Medicine

## 2020-06-20 NOTE — Progress Notes (Signed)
Hello Keshonna,  Your lab result is normal and/or stable.Some minor variations that are not significant are commonly marked abnormal, but do not represent any medical problem for you.  Best regards, Ivo Moga, M.D.

## 2020-11-09 DIAGNOSIS — M79673 Pain in unspecified foot: Secondary | ICD-10-CM | POA: Diagnosis not present

## 2020-11-09 DIAGNOSIS — D2372 Other benign neoplasm of skin of left lower limb, including hip: Secondary | ICD-10-CM | POA: Diagnosis not present

## 2020-11-09 DIAGNOSIS — D2371 Other benign neoplasm of skin of right lower limb, including hip: Secondary | ICD-10-CM | POA: Diagnosis not present

## 2020-11-15 ENCOUNTER — Other Ambulatory Visit: Payer: Self-pay | Admitting: Family Medicine

## 2020-11-15 DIAGNOSIS — Z1231 Encounter for screening mammogram for malignant neoplasm of breast: Secondary | ICD-10-CM

## 2020-12-15 ENCOUNTER — Other Ambulatory Visit: Payer: Self-pay

## 2020-12-15 ENCOUNTER — Encounter: Payer: Self-pay | Admitting: Family Medicine

## 2020-12-15 ENCOUNTER — Ambulatory Visit (INDEPENDENT_AMBULATORY_CARE_PROVIDER_SITE_OTHER): Payer: Medicare HMO | Admitting: Family Medicine

## 2020-12-15 VITALS — BP 158/74 | HR 73 | Temp 97.8°F | Wt 138.8 lb

## 2020-12-15 DIAGNOSIS — E782 Mixed hyperlipidemia: Secondary | ICD-10-CM

## 2020-12-15 DIAGNOSIS — G500 Trigeminal neuralgia: Secondary | ICD-10-CM

## 2020-12-15 DIAGNOSIS — M8589 Other specified disorders of bone density and structure, multiple sites: Secondary | ICD-10-CM

## 2020-12-15 DIAGNOSIS — R202 Paresthesia of skin: Secondary | ICD-10-CM | POA: Diagnosis not present

## 2020-12-15 MED ORDER — GABAPENTIN 300 MG PO CAPS: 600.0000 mg | ORAL_CAPSULE | Freq: Every day | ORAL | 0 refills | Status: DC

## 2020-12-15 NOTE — Patient Instructions (Signed)
Take one gabapentin every night for prevention of symptoms. Use the second one as needed for flares from day to day. If  you realize you are using the second pill as often as every other day, start taking two every evening. If that doesn't give you adequate relief. Let's do a follow up office visit.

## 2020-12-15 NOTE — Progress Notes (Signed)
Subjective:  Patient ID: Kathryn Young, female    DOB: [birth year]  Age: 75 y.o. MRN: [id number]  CC: Hyperlipidemia   HPI Kathryn Young presents for trigeminal neuralgia flares occasionally. IN evenings usually. Pain in distribution of the T2 branch.  Interested in relief. Taking OTC's daily. Pain is 3-4/10. It is located at the right cheek to medial epicanthus and radiated over the right forehead to the right temporal scalp.   . in for follow-up of elevated cholesterol. Doing well without complaints on current medication. Denies side effects of statin including myalgia and arthralgia and nausea. Currently no chest pain, shortness of breath or other cardiovascular related symptoms noted.    Depression screen [facility] 2/9 12/15/2020 06/17/2020 01/05/2020  Decreased Interest 0 0 0  Down, Depressed, Hopeless 0 0 0  PHQ - 2 Score 0 0 0    History Kathryn Young has a past medical history of Brain infection, Breast mass, right, Hyperlipidemia, Hypertension, Meningitis, Neuromuscular disorder ([place]), and SUNCT (short unilateral neuralgiform headache, conjunctival inj/tear) (02/16/2017).   Kathryn Young has a past surgical history that includes Abdominal hysterectomy (1990); Kidney Stones; Breast surgery (Left, 1981); Breast lumpectomy with radioactive seed localization (Right, 03/27/2016); Breast lumpectomy (Right, 2017); Breast lumpectomy (Left); and Cataract extraction (Bilateral, 2021).   Kathryn Young family history includes Breast cancer in Kathryn Young mother; Cancer in Kathryn Young mother; Heart attack in Kathryn Young paternal grandfather; Hyperlipidemia in Kathryn Young son; Hypertension in Kathryn Young son; Other in Kathryn Young father; Pneumonia in Kathryn Young brother; Prostate cancer in Kathryn Young paternal grandfather.Kathryn Young reports that Kathryn Young has never smoked. Kathryn Young has never used smokeless tobacco. Kathryn Young reports that Kathryn Young does not drink alcohol and does not use drugs.    ROS Review of Systems  Constitutional: Negative.   HENT: Negative.   Eyes: Negative for visual disturbance.  Respiratory:  Negative for shortness of breath.   Cardiovascular: Negative for chest pain.  Gastrointestinal: Negative for abdominal pain.  Musculoskeletal: Negative for arthralgias.    Objective:  BP (!) 158/74   Pulse 73   Temp 97.8 F (36.6 C) (Temporal)   Wt 138 lb 12.8 oz (63 kg)   BMI 24.59 kg/m   BP Readings from Last 3 Encounters:  12/15/20 (!) 158/74  06/17/20 134/67  05/06/20 (!) 152/80    Wt Readings from Last 3 Encounters:  12/15/20 138 lb 12.8 oz (63 kg)  06/17/20 137 lb 2 oz (62.2 kg)  05/06/20 141 lb (64 kg)     Physical Exam Constitutional:      General: Kathryn Young is not in acute distress.    Appearance: Kathryn Young is well-developed.  HENT:     Head: Normocephalic and atraumatic.  Eyes:     Conjunctiva/sclera: Conjunctivae normal.     Pupils: Pupils are equal, round, and reactive to light.  Neck:     Thyroid: No thyromegaly.  Cardiovascular:     Rate and Rhythm: Normal rate and regular rhythm.     Heart sounds: Normal heart sounds. No murmur heard.   Pulmonary:     Effort: Pulmonary effort is normal. No respiratory distress.     Breath sounds: Normal breath sounds. No wheezing or rales.  Abdominal:     General: Bowel sounds are normal. There is no distension.     Palpations: Abdomen is soft.     Tenderness: There is no abdominal tenderness.  Musculoskeletal:        General: Normal range of motion.     Cervical back: Normal range of motion and neck supple.  Lymphadenopathy:  Cervical: No cervical adenopathy.  Skin:    General: Skin is warm and dry.  Neurological:     Mental Status: Kathryn Young is alert and oriented to person, place, and time.  Psychiatric:        Behavior: Behavior normal.        Thought Content: Thought content normal.        Judgment: Judgment normal.       Assessment & Plan:   Kathryn Young was seen today for hyperlipidemia.  Diagnoses and all orders for this visit:  Trigeminal neuralgia of right side of face -     CBC with  Differential/Platelet -     CMP14+EGFR  Notalgia paresthetica -     gabapentin (NEURONTIN) 300 MG capsule; Take 2 capsules (600 mg total) by mouth at bedtime. -     CBC with Differential/Platelet -     CMP14+EGFR  Mixed hyperlipidemia -     CBC with Differential/Platelet -     CMP14+EGFR -     Lipid panel  Osteopenia of multiple sites -     CBC with Differential/Platelet -     CMP14+EGFR -     VITAMIN D 25 Hydroxy (Vit-D Deficiency, Fractures)       I am having Kathryn Young maintain Kathryn Young Olopatadine HCl (PATADAY OP), Propylene Glycol (SYSTANE COMPLETE OP), calcium citrate-vitamin D, Zinc, raloxifene, DULoxetine, pantoprazole, simvastatin, Vitamin D (Ergocalciferol), celecoxib, and gabapentin.  Allergies as of 12/15/2020   No Known Allergies     Medication List       Accurate as of December 15, 2020  8:19 PM. If you have any questions, ask your nurse or doctor.        calcium citrate-vitamin D 315-200 MG-UNIT tablet Commonly known as: CITRACAL+D Take 1 tablet by mouth 2 (two) times daily.   celecoxib 200 MG capsule Commonly known as: CeleBREX Take 1 capsule (200 mg total) by mouth daily. With food   DULoxetine 30 MG capsule Commonly known as: CYMBALTA Take 1 capsule (30 mg total) by mouth daily.   gabapentin 300 MG capsule Commonly known as: NEURONTIN Take 2 capsules (600 mg total) by mouth at bedtime.   pantoprazole 40 MG tablet Commonly known as: PROTONIX TAKE (1) TABLET DAILY FOR STOMACH   PATADAY OP Apply to eye.   raloxifene 60 MG tablet Commonly known as: EVISTA Take 1 tablet (60 mg total) by mouth daily.   simvastatin 20 MG tablet Commonly known as: ZOCOR Take 1 tablet (20 mg total) by mouth daily.   SYSTANE COMPLETE OP Apply to eye.   Vitamin D (Ergocalciferol) 1.25 MG (50000 UNIT) Caps capsule Commonly known as: DRISDOL Take 1 capsule (50,000 Units total) by mouth every 7 (seven) days.   Zinc 50 MG Tabs Take by mouth.         Follow-up: Return in about 6 weeks (around 01/26/2021).  Claretta Fraise, M.D.

## 2020-12-16 LAB — CBC WITH DIFFERENTIAL/PLATELET
Basophils Absolute: 0 10*3/uL (ref 0.0–0.2)
Basos: 0 %
EOS (ABSOLUTE): 0.1 10*3/uL (ref 0.0–0.4)
Eos: 1 %
Hematocrit: 36.8 % (ref 34.0–46.6)
Hemoglobin: 13.1 g/dL (ref 11.1–15.9)
Immature Grans (Abs): 0 10*3/uL (ref 0.0–0.1)
Immature Granulocytes: 0 %
Lymphocytes Absolute: 2.1 10*3/uL (ref 0.7–3.1)
Lymphs: 37 %
MCH: 33.5 pg — ABNORMAL HIGH (ref 26.6–33.0)
MCHC: 35.6 g/dL (ref 31.5–35.7)
MCV: 94 fL (ref 79–97)
Monocytes Absolute: 0.4 10*3/uL (ref 0.1–0.9)
Monocytes: 6 %
Neutrophils Absolute: 3 10*3/uL (ref 1.4–7.0)
Neutrophils: 56 %
Platelets: 227 10*3/uL (ref 150–450)
RBC: 3.91 x10E6/uL (ref 3.77–5.28)
RDW: 12.4 % (ref 11.7–15.4)
WBC: 5.6 10*3/uL (ref 3.4–10.8)

## 2020-12-16 LAB — CMP14+EGFR
ALT: 9 IU/L (ref 0–32)
AST: 15 IU/L (ref 0–40)
Albumin/Globulin Ratio: 2 (ref 1.2–2.2)
Albumin: 4.5 g/dL (ref 3.7–4.7)
Alkaline Phosphatase: 95 IU/L (ref 44–121)
BUN/Creatinine Ratio: 8 — ABNORMAL LOW (ref 12–28)
BUN: 7 mg/dL — ABNORMAL LOW (ref 8–27)
Bilirubin Total: 0.4 mg/dL (ref 0.0–1.2)
CO2: 24 mmol/L (ref 20–29)
Calcium: 9.4 mg/dL (ref 8.7–10.3)
Chloride: 105 mmol/L (ref 96–106)
Creatinine, Ser: 0.83 mg/dL (ref 0.57–1.00)
Globulin, Total: 2.2 g/dL (ref 1.5–4.5)
Glucose: 110 mg/dL — ABNORMAL HIGH (ref 65–99)
Potassium: 4.2 mmol/L (ref 3.5–5.2)
Sodium: 144 mmol/L (ref 134–144)
Total Protein: 6.7 g/dL (ref 6.0–8.5)
eGFR: 74 mL/min/{1.73_m2} (ref >59)

## 2020-12-16 LAB — LIPID PANEL
Chol/HDL Ratio: 2.9 ratio (ref 0.0–4.4)
Cholesterol, Total: 179 mg/dL (ref 100–199)
HDL: 61 mg/dL (ref >39)
LDL Chol Calc (NIH): 97 mg/dL (ref 0–99)
Triglycerides: 118 mg/dL (ref 0–149)
VLDL Cholesterol Cal: 21 mg/dL (ref 5–40)

## 2020-12-16 LAB — VITAMIN D 25 HYDROXY (VIT D DEFICIENCY, FRACTURES): Vit D, 25-Hydroxy: 45.7 ng/mL (ref 30.0–100.0)

## 2020-12-21 DIAGNOSIS — Z961 Presence of intraocular lens: Secondary | ICD-10-CM | POA: Diagnosis not present

## 2020-12-21 DIAGNOSIS — H04123 Dry eye syndrome of bilateral lacrimal glands: Secondary | ICD-10-CM | POA: Diagnosis not present

## 2020-12-21 DIAGNOSIS — Z9889 Other specified postprocedural states: Secondary | ICD-10-CM | POA: Diagnosis not present

## 2020-12-21 DIAGNOSIS — H1045 Other chronic allergic conjunctivitis: Secondary | ICD-10-CM | POA: Diagnosis not present

## 2021-01-10 ENCOUNTER — Ambulatory Visit (INDEPENDENT_AMBULATORY_CARE_PROVIDER_SITE_OTHER): Payer: Medicare HMO

## 2021-01-10 DIAGNOSIS — Z0000 Encounter for general adult medical examination without abnormal findings: Secondary | ICD-10-CM

## 2021-01-10 NOTE — Patient Instructions (Signed)
  Kathryn Young , Thank you for taking time to come for your Medicare Wellness Visit. I appreciate your ongoing commitment to your health goals. Please review the following plan we discussed and let me know if I can assist you in the future.   These are the goals we discussed: Goals    . DIET - INCREASE WATER INTAKE     Try to drink 6-8 glasses of water daily.    . Exercise 3x per week (30 min per time)     Resume walking 3 times per week for 30 minutes    . Patient Stated     01/05/2020 AWV Goal: Exercise for General Health   Patient will verbalize understanding of the benefits of increased physical activity:  Exercising regularly is important. It will improve your overall fitness, flexibility, and endurance.  Regular exercise also will improve your overall health. It can help you control your weight, reduce stress, and improve your bone density.  Over the next year, patient will increase physical activity as tolerated with a goal of at least 150 minutes of moderate physical activity per week.   You can tell that you are exercising at a moderate intensity if your heart starts beating faster and you start breathing faster but can still hold a conversation.  Moderate-intensity exercise ideas include:  Walking 1 mile (1.6 km) in about 15 minutes  Biking  Hiking  Golfing  Dancing  Water aerobics  Patient will verbalize understanding of everyday activities that increase physical activity by providing examples like the following: ? Yard work, such as: ? Pushing a Conservation officer, nature ? Raking and bagging leaves ? Washing your car ? Pushing a stroller ? Shoveling snow ? Gardening ? Washing windows or floors  Patient will be able to explain general safety guidelines for exercising:   Before you start a new exercise program, talk with your health care provider.  Do not exercise so much that you hurt yourself, feel dizzy, or get very short of breath.  Wear comfortable clothes and  wear shoes with good support.  Drink plenty of water while you exercise to prevent dehydration or heat stroke.  Work out until your breathing and your heartbeat get faster.        This is a list of the screening recommended for you and due dates:  Health Maintenance  Topic Date Due  . Tetanus Vaccine  06/17/2021*  . Mammogram  11/26/2021  . Colon Cancer Screening  07/07/2026  . Flu Shot  Completed  . DEXA scan (bone density measurement)  Completed  . COVID-19 Vaccine  Completed  .  Hepatitis C: One time screening is recommended by Center for Disease Control  (CDC) for  adults born from [birth year] through 1965.   Completed  . Pneumonia vaccines  Completed  . HPV Vaccine  Aged Out  *Topic was postponed. The date shown is not the original due date.

## 2021-01-10 NOTE — Progress Notes (Signed)
MEDICARE ANNUAL WELLNESS VISIT  01/10/2021  Telephone Visit Disclaimer This Medicare AWV was conducted by telephone due to national recommendations for restrictions regarding the COVID-19 Pandemic (e.g. social distancing).  I verified, using two identifiers, that I am speaking with Kathryn Young or their authorized healthcare agent. I discussed the limitations, risks, security, and privacy concerns of performing an evaluation and management service by telephone and the potential availability of an in-person appointment in the future. The patient expressed understanding and agreed to proceed.  Location of Patient: Home Location of Provider (nurse):  [facility]  Subjective:    Kathryn Young is a 75 y.o. female patient of Stacks, Cletus Gash, MD who had a Medicare Annual Wellness Visit today via telephone. Bernetta lives in nearby [place] with her husband [name]. Her adult son lives in her basement that they have converted in to an apartment and her grandson lives with them as well. Her grandson will graduate this year. 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 She states that she had a younger brother die at 48 days old from pneumonia. She was the only child. Her dad drowned when he was 7 years old. She is very active and walks regularly and uses her stationary bike. She is very involved in her church and helps to clean it regularly. Before Covid her and friends would go out and eat weekly but haven't started that back up again. She had Gamma knife surgery and states that this has helped her.   Patient Care Team: Claretta Fraise, MD as PCP - General (Family Medicine) Harl Bowie Alphonse Guild, MD as PCP - Cardiology (Cardiology) Georgina Quint, MD as Referring Physician (Radiology)  Advanced Directives 01/10/2021 01/05/2020 06/03/2019 12/19/2017 03/27/2016 03/20/2016 04/05/2015  Does Patient Have a Medical Advance Directive? Yes Yes Yes Yes Yes Yes Yes  Type of Advance  Directive Living will Living will Living will Living will Healthcare Power of [place] -  Does patient want to make changes to medical advance directive? No - Patient declined - No - Patient declined (No Data) No - Patient declined - -  Would patient like information on creating a medical advance directive? No - Patient declined - - - - - [employer] Utilization Over the Past 12 Months: # of hospitalizations or ER visits: 0 # of surgeries: 0  Review of Systems    Patient reports that her overall health is worse compared to last year.  History obtained from chart review  Patient Reported Readings (BP, Pulse, CBG, Weight, etc) none  Pain Assessment Pain : 0-10 Pain Score: 4  Pain Type: Chronic pain Pain Location: Jaw Pain Onset: More than a month ago Pain Frequency: Constant Pain Relieving Factors: Medication  Pain Relieving Factors: Medication  Current Medications & Allergies (verified) Allergies as of 01/10/2021   No Known Allergies     Medication List       Accurate as of January 10, 2021 10:11 AM. If you have any questions, ask your nurse or doctor.        calcium citrate-vitamin D 315-200 MG-UNIT tablet Commonly known as: CITRACAL+D Take 1 tablet by mouth 2 (two) times daily.   celecoxib 200 MG capsule Commonly known as: CeleBREX Take 1 capsule (200 mg total) by mouth daily. With food   DULoxetine 30 MG capsule Commonly known as: CYMBALTA Take 1 capsule (30 mg total) by mouth daily.   gabapentin 300  MG capsule Commonly known as: NEURONTIN Take 2 capsules (600 mg total) by mouth at bedtime.   pantoprazole 40 MG tablet Commonly known as: PROTONIX TAKE (1) TABLET DAILY FOR STOMACH   PATADAY OP Apply to eye.   raloxifene 60 MG tablet Commonly known as: EVISTA Take 1 tablet (60 mg total) by mouth daily.   simvastatin 20 MG tablet Commonly known as: ZOCOR Take 1 tablet (20 mg total) by mouth daily.   SYSTANE COMPLETE  OP Apply to eye.   Vitamin D (Ergocalciferol) 1.25 MG (50000 UNIT) Caps capsule Commonly known as: DRISDOL Take 1 capsule (50,000 Units total) by mouth every 7 (seven) days.   Zinc 50 MG Tabs Take by mouth.       History (reviewed): Past Medical History:  Diagnosis Date  . Brain infection    Age 86  . Breast mass, right   . Hyperlipidemia   . Hypertension    no meds  . Meningitis    Age 69  . Neuromuscular disorder ([place])    right shoulder with burning down arm  . SUNCT (short unilateral neuralgiform headache, conjunctival inj/tear) 02/16/2017   Past Surgical History:  Procedure Laterality Date  . ABDOMINAL HYSTERECTOMY  1990  . BREAST LUMPECTOMY Right 2017  . BREAST LUMPECTOMY Left    pt states she is not sure of when or where believes it was in [place].  [place] [facility] BREAST LUMPECTOMY WITH RADIOACTIVE SEED LOCALIZATION Right 03/27/2016   Procedure: RIGHT BREAST LUMPECTOMYAFTER RADIOACTIVE SEED LOCALIZATION;  Surgeon: Jackolyn Confer, MD;  Location: [place];  Service: General;  Laterality: Right;  RIGHT BREAST LUMPECTOMYAFTER RADIOACTIVE SEED LOCALIZATION  . BREAST SURGERY Left 1981   benign cysts removed  . CATARACT EXTRACTION Bilateral 2021  . Kidney Stones     Family History  Problem Relation Age of Onset  . Cancer Mother        Breast  . Breast cancer Mother   . Other Father        Drowned  . Prostate cancer Paternal Grandfather   . Heart attack Paternal Grandfather   . Pneumonia Brother   . Hypertension Son   . Hyperlipidemia Son    Social History   Socioeconomic History  . Marital status: Married    Spouse name: [name]  . Number of children: 1  . Years of education: 30  . Highest education level: High school graduate  Occupational History  . 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 Smoking status: Never Smoker  . Smokeless tobacco: Never Used  Vaping Use  . Vaping Use: Never used  Substance and Sexual Activity  .  Alcohol use: No  . Drug use: No  . Sexual activity: Yes    Partners: Male    Birth control/protection: Surgical    Comment: Married  Other Topics Concern  . Not on file  Social History Narrative   Lives at home w/ her husband and grandson   Right-handed   Caffeine: 2 cups of coffee and tea throughout the day   Social Determinants of Health   Financial Resource Strain: Not on file  Food Insecurity: Not on file  Transportation Needs: Not on file  Physical Activity: Not on file  Stress: Not on file  Social Connections: Not on file    Activities of Daily Living In your present state of health, do you have any difficulty performing the following activities: 01/10/2021  Hearing? N  Vision? N  Difficulty concentrating  or making decisions? N  Walking or climbing stairs? N  Dressing or bathing? N  Doing errands, shopping? N  Preparing Food and eating ? N  Using the Toilet? N  In the past six months, have you accidently leaked urine? N  Do you have problems with loss of bowel control? N  Managing your Medications? N  Managing your Finances? N  Housekeeping or managing your Housekeeping? N  Some recent data might be hidden    Patient Education/ Literacy How often do you need to have someone help you when you read instructions, pamphlets, or other written materials from your doctor or pharmacy?: 1 - Never What is the last grade level you completed in school?: 12th grade  Exercise Current Exercise Habits: The patient does not participate in regular exercise at present, Exercise limited by: None identified  Diet Patient reports consuming 3 meals a day and 1 snack(s) a day Patient reports that her primary diet is: Regular Patient reports that she does have regular access to food.   Depression Screen PHQ 2/9 Scores 01/10/2021 12/15/2020 06/17/2020 01/05/2020 12/15/2019 06/03/2019 07/05/2018  PHQ - 2 Score 0 0 0 0 0 0 0     Fall Risk Fall Risk  01/10/2021 12/15/2020 06/17/2020 01/05/2020  12/15/2019  Falls in the past year? 0 0 0 0 0  Number falls in past yr: - - - - 0  Injury with Fall? - - - - 0  Risk for fall due to : - - - - No Fall Risks  Follow up - - Falls evaluation completed - Falls evaluation completed     Objective:  Kathryn Young seemed alert and oriented and she participated appropriately during our telephone visit.  Blood Pressure Weight BMI  BP Readings from Last 3 Encounters:  12/15/20 (!) 158/74  06/17/20 134/67  05/06/20 (!) 152/80   Wt Readings from Last 3 Encounters:  12/15/20 138 lb 12.8 oz (63 kg)  06/17/20 137 lb 2 oz (62.2 kg)  05/06/20 141 lb (64 kg)   BMI Readings from Last 1 Encounters:  12/15/20 24.59 kg/m    *Unable to obtain current vital signs, weight, and BMI due to telephone visit type  Hearing/Vision  . Shaela did not seem to have difficulty with hearing/understanding during the telephone conversation . Reports that she has had a formal eye exam by an eye care professional within the past year . Reports that she has had a formal hearing evaluation within the past year *Unable to fully assess hearing and vision during telephone visit type  Cognitive Function: 6CIT Screen 01/10/2021 01/05/2020 06/03/2019  What Year? 0 points 0 points 0 points  What month? 0 points 0 points 0 points  What time? 0 points 0 points 0 points  Count back from 20 0 points 0 points 0 points  Months in reverse 0 points 0 points 0 points  Repeat phrase 0 points 0 points 0 points  Total Score 0 0 0   (Normal:0-7, Significant for Dysfunction: >8)  Normal Cognitive Function Screening: Yes   Immunization & Health Maintenance Record Immunization History  Administered Date(s) Administered  . Fluad Quad(high Dose 65+) 07/01/2019, 07/16/2020  . Influenza, High Dose Seasonal PF 07/31/2016, 07/28/2017  . Influenza,inj,Quad PF,6-35 Mos 07/02/2019  . Influenza-Unspecified 07/25/2014, 07/25/2015, 07/29/2018  . Moderna Sars-Covid-2 Vaccination 12/30/2019,  01/27/2020, 11/02/2020  . Pneumococcal Conjugate-13 02/18/2016  . Pneumococcal Polysaccharide-23 09/16/2011  . Tdap 04/06/2010  . Zoster 11/21/2007    Health Maintenance  Topic Date Due  .  TETANUS/TDAP  06/17/2021 (Originally 04/06/2020)  . MAMMOGRAM  11/26/2021  . COLONOSCOPY (Pts 45-58yrs Insurance coverage will need to be confirmed)  07/07/2026  . INFLUENZA VACCINE  Completed  . DEXA SCAN  Completed  . COVID-19 Vaccine  Completed  . Hepatitis C Screening  Completed  . PNA vac Low Risk Adult  Completed  . HPV VACCINES  Aged Out       Assessment  This is a routine wellness examination for Kathryn Young.  Health Maintenance: Due or Overdue There are no preventive care reminders to display for this patient.  Kathryn Young does not need a referral for Community Assistance: Care Management:   no Social Work:    no Prescription Assistance:  no Nutrition/Diabetes Education:  no   Plan:  Personalized Goals Goals Addressed   None    Personalized Health Maintenance & Screening Recommendations  Td vaccine  Lung Cancer Screening Recommended: no (Low Dose CT Chest recommended if Age 83-80 years, 30 pack-year currently smoking OR have quit w/in past 15 years) Hepatitis C Screening recommended: done HIV Screening recommended: no  Advanced Directives: Written information was not prepared per patient's request.  Referrals & Orders No orders of the defined types were placed in this encounter.   Follow-up Plan . Follow-up with Claretta Fraise, MD as planned . We will check price of tetanus at next office visit    I have personally reviewed and noted the following in the patient's chart:   . Medical and social history . Use of alcohol, tobacco or illicit drugs  . Current medications and supplements . Functional ability and status . Nutritional status . Physical activity . Advanced directives . List of other physicians . Hospitalizations, surgeries, and ER visits in  previous 12 months . Vitals . Screenings to include cognitive, depression, and falls . Referrals and appointments  In addition, I have reviewed and discussed with Kathryn Young certain preventive protocols, quality metrics, and best practice recommendations. A written personalized care plan for preventive services as well as general preventive health recommendations is available and can be mailed to the patient at her request.      Rolena Infante LPN [id number]

## 2021-01-12 ENCOUNTER — Ambulatory Visit
Admission: RE | Admit: 2021-01-12 | Discharge: 2021-01-12 | Disposition: A | Discharge status: Home / Self Care | Payer: Medicare HMO | Source: Ambulatory Visit | Attending: Family Medicine | Admitting: Family Medicine

## 2021-01-12 ENCOUNTER — Other Ambulatory Visit: Payer: Self-pay

## 2021-01-12 DIAGNOSIS — L821 Other seborrheic keratosis: Secondary | ICD-10-CM | POA: Diagnosis not present

## 2021-01-12 DIAGNOSIS — D1801 Hemangioma of skin and subcutaneous tissue: Secondary | ICD-10-CM | POA: Diagnosis not present

## 2021-01-12 DIAGNOSIS — L814 Other melanin hyperpigmentation: Secondary | ICD-10-CM | POA: Diagnosis not present

## 2021-01-12 DIAGNOSIS — L720 Epidermal cyst: Secondary | ICD-10-CM | POA: Diagnosis not present

## 2021-01-12 DIAGNOSIS — Z1231 Encounter for screening mammogram for malignant neoplasm of breast: Secondary | ICD-10-CM | POA: Diagnosis not present

## 2021-01-12 DIAGNOSIS — D225 Melanocytic nevi of trunk: Secondary | ICD-10-CM | POA: Diagnosis not present

## 2021-02-17 ENCOUNTER — Other Ambulatory Visit: Payer: Self-pay | Admitting: Family Medicine

## 2021-02-17 DIAGNOSIS — R202 Paresthesia of skin: Secondary | ICD-10-CM

## 2021-06-22 ENCOUNTER — Other Ambulatory Visit: Payer: Self-pay

## 2021-06-22 ENCOUNTER — Encounter: Payer: Self-pay | Admitting: Family Medicine

## 2021-06-22 ENCOUNTER — Ambulatory Visit (INDEPENDENT_AMBULATORY_CARE_PROVIDER_SITE_OTHER): Payer: Medicare HMO | Admitting: Family Medicine

## 2021-06-22 VITALS — BP 127/72 | HR 72 | Temp 98.1°F | Ht 63.0 in | Wt 140.2 lb

## 2021-06-22 DIAGNOSIS — R202 Paresthesia of skin: Secondary | ICD-10-CM

## 2021-06-22 DIAGNOSIS — E559 Vitamin D deficiency, unspecified: Secondary | ICD-10-CM

## 2021-06-22 DIAGNOSIS — G500 Trigeminal neuralgia: Secondary | ICD-10-CM | POA: Diagnosis not present

## 2021-06-22 DIAGNOSIS — K219 Gastro-esophageal reflux disease without esophagitis: Secondary | ICD-10-CM | POA: Diagnosis not present

## 2021-06-22 DIAGNOSIS — E782 Mixed hyperlipidemia: Secondary | ICD-10-CM

## 2021-06-22 DIAGNOSIS — M8589 Other specified disorders of bone density and structure, multiple sites: Secondary | ICD-10-CM | POA: Diagnosis not present

## 2021-06-22 MED ORDER — PANTOPRAZOLE SODIUM 40 MG PO TBEC: DELAYED_RELEASE_TABLET | ORAL | 3 refills | Status: DC

## 2021-06-22 MED ORDER — DULOXETINE HCL 30 MG PO CPEP: 30.0000 mg | ORAL_CAPSULE | Freq: Every day | ORAL | 3 refills | Status: DC

## 2021-06-22 MED ORDER — CELECOXIB 200 MG PO CAPS: 200.0000 mg | ORAL_CAPSULE | Freq: Every day | ORAL | 3 refills | Status: DC

## 2021-06-22 MED ORDER — VITAMIN D (ERGOCALCIFEROL) 1.25 MG (50000 UNIT) PO CAPS: 50000.0000 [IU] | ORAL_CAPSULE | ORAL | 3 refills | Status: DC

## 2021-06-22 MED ORDER — GABAPENTIN 300 MG PO CAPS: ORAL_CAPSULE | ORAL | 3 refills | Status: DC

## 2021-06-22 MED ORDER — RALOXIFENE HCL 60 MG PO TABS: 60.0000 mg | ORAL_TABLET | Freq: Every day | ORAL | 3 refills | Status: DC

## 2021-06-22 MED ORDER — SIMVASTATIN 20 MG PO TABS: 20.0000 mg | ORAL_TABLET | Freq: Every day | ORAL | 3 refills | Status: DC

## 2021-06-22 NOTE — Patient Instructions (Signed)

## 2021-06-22 NOTE — Progress Notes (Signed)
Subjective:  Patient ID: Kathryn Young, female    DOB: [birth year]  Age: 75 y.o. MRN: [id number]  CC: Medical Management of Chronic Issues   HPI UNKNOWN FLANNIGAN presents for follow-up of elevated cholesterol. Doing well without complaints on current medication. Denies side effects of statin including myalgia and arthralgia and nausea. Also in today for liver function testing. Currently no chest pain, shortness of breath or other cardiovascular related symptoms noted.  Occasional paresthesia at right shoulder from her Trigeminal neuralgia. Right eye dry at times.   Considering Shingrix.   Pt. Brings in BP readings showing multiple systolic pressures over 982. Diastolics in the 64B  History D[place]ea has a past medical history of Brain infection, Breast mass, right, Hyperlipidemia, Hypertension, Meningitis, Neuromuscular disorder ([place]), and SUNCT (short unilateral neuralgiform headache, conjunctival inj/tear) (02/16/2017).   She has a past surgical history that includes Abdominal hysterectomy (1990); Kidney Stones; Breast surgery (Left, 1981); Breast lumpectomy with radioactive seed localization (Right, 03/27/2016); Breast lumpectomy (Right, 2017); Breast lumpectomy (Left); Cataract extraction (Bilateral, 2021); Breast biopsy (Right, 02/02/2016); Breast excisional biopsy (Right, 03/27/2016); and Breast excisional biopsy (Left).   Her family history includes Breast cancer in her mother; Cancer in her mother; Heart attack in her paternal grandfather; Hyperlipidemia in her son; Hypertension in her son; Other in her father; Pneumonia in her brother; Prostate cancer in her paternal grandfather.She reports that she has never smoked. She has never used smokeless tobacco. She reports that she does not drink alcohol and does not use drugs.  Current Outpatient Medications on File Prior to Visit  Medication Sig Dispense Refill   calcium citrate-vitamin D (CITRACAL+D) 315-200 MG-UNIT tablet Take 1 tablet by  mouth 2 (two) times daily.     Olopatadine HCl (PATADAY OP) Apply to eye.     Propylene Glycol (SYSTANE COMPLETE OP) Apply to eye.     Zinc 50 MG TABS Take by mouth.     No current facility-administered medications on file prior to visit.    ROS Review of Systems  Constitutional: Negative.   HENT: Negative.    Eyes:  Negative for visual disturbance.  Respiratory:  Negative for shortness of breath.   Cardiovascular:  Negative for chest pain.  Gastrointestinal:  Negative for abdominal pain.  Musculoskeletal:  Negative for arthralgias.   Objective:  BP 127/72   Pulse 72   Temp 98.1 F (36.7 C)   Ht _0  (1.6 m)   Wt 140 lb 3.2 oz (63.6 kg)   SpO2 99%   BMI 24.84 kg/m   BP Readings from Last 3 Encounters:  06/22/21 127/72  12/15/20 (!) 158/74  06/17/20 134/67    Wt Readings from Last 3 Encounters:  06/22/21 140 lb 3.2 oz (63.6 kg)  12/15/20 138 lb 12.8 oz (63 kg)  06/17/20 137 lb 2 oz (62.2 kg)     Physical Exam Constitutional:      General: She is not in acute distress.    Appearance: She is well-developed.  Cardiovascular:     Rate and Rhythm: Normal rate and regular rhythm.  Pulmonary:     Breath sounds: Normal breath sounds.  Musculoskeletal:        General: Normal range of motion.  Skin:    General: Skin is warm and dry.  Neurological:     Mental Status: She is alert and oriented to person, place, and time.    No results found for: HGBA1C  Lab Results  Component Value Date   WBC 5.6  12/15/2020   HGB 13.1 12/15/2020   HCT 36.8 12/15/2020   PLT 227 12/15/2020   GLUCOSE 110 (H) 12/15/2020   CHOL 179 12/15/2020   TRIG 118 12/15/2020   HDL 61 12/15/2020   LDLCALC 97 12/15/2020   ALT 9 12/15/2020   AST 15 12/15/2020   NA 144 12/15/2020   K 4.2 12/15/2020   CL 105 12/15/2020   CREATININE 0.83 12/15/2020   BUN 7 (L) 12/15/2020   CO2 24 12/15/2020   TSH 1.750 01/02/2018    MM 3D SCREEN BREAST BILATERAL  Result Date: 01/13/2021 CLINICAL  DATA:  Screening. EXAM: DIGITAL SCREENING BILATERAL MAMMOGRAM WITH TOMOSYNTHESIS AND CAD TECHNIQUE: Bilateral screening digital craniocaudal and mediolateral oblique mammograms were obtained. Bilateral screening digital breast tomosynthesis was performed. The images were evaluated with computer-aided detection. COMPARISON:  None. ACR Breast Density Category b: There are scattered areas of fibroglandular density. FINDINGS: There are no findings suspicious for malignancy. The images were evaluated with computer-aided detection. IMPRESSION: No mammographic evidence of malignancy. A result letter of this screening mammogram will be mailed directly to the patient. RECOMMENDATION: Screening mammogram in one year. (Code:SM-B-01Y) BI-RADS CATEGORY  1: Negative. Electronically Signed   By: Abelardo Diesel M.D.   On: 01/13/2021 13:59    Assessment & Plan:   Majestic was seen today for medical management of chronic issues.  Diagnoses and all orders for this visit:  Mixed hyperlipidemia -     CBC with Differential/Platelet -     CMP14+EGFR -     Lipid panel -     simvastatin (ZOCOR) 20 MG tablet; Take 1 tablet (20 mg total) by mouth daily.  Vitamin D deficiency -     VITAMIN D 25 Hydroxy (Vit-D Deficiency, Fractures) -     Vitamin D, Ergocalciferol, (DRISDOL) 1.25 MG (50000 UNIT) CAPS capsule; Take 1 capsule (50,000 Units total) by mouth every 7 (seven) days.  Notalgia paresthetica -     gabapentin (NEURONTIN) 300 MG capsule; TAKE (1) OR (2) CAPSULES THREE TIMES DAILY. -     DULoxetine (CYMBALTA) 30 MG capsule; Take 1 capsule (30 mg total) by mouth daily.  Gastroesophageal reflux disease without esophagitis -     pantoprazole (PROTONIX) 40 MG tablet; TAKE (1) TABLET DAILY FOR STOMACH  Osteopenia of multiple sites -     raloxifene (EVISTA) 60 MG tablet; Take 1 tablet (60 mg total) by mouth daily.  Trigeminal neuralgia of right side of face -     DULoxetine (CYMBALTA) 30 MG capsule; Take 1 capsule (30 mg  total) by mouth daily.  Other orders -     celecoxib (CELEBREX) 200 MG capsule; Take 1 capsule (200 mg total) by mouth daily. With food  I am having Lisvet B. Melland maintain her Olopatadine HCl (PATADAY OP), Propylene Glycol (SYSTANE COMPLETE OP), calcium citrate-vitamin D, Zinc, celecoxib, gabapentin, pantoprazole, raloxifene, simvastatin, Vitamin D (Ergocalciferol), and DULoxetine.  Meds ordered this encounter  Medications   celecoxib (CELEBREX) 200 MG capsule    Sig: Take 1 capsule (200 mg total) by mouth daily. With food    Dispense:  90 capsule    Refill:  3   gabapentin (NEURONTIN) 300 MG capsule    Sig: TAKE (1) OR (2) CAPSULES THREE TIMES DAILY.    Dispense:  180 capsule    Refill:  3   pantoprazole (PROTONIX) 40 MG tablet    Sig: TAKE (1) TABLET DAILY FOR STOMACH    Dispense:  90 tablet    Refill:  3   raloxifene (EVISTA) 60 MG tablet    Sig: Take 1 tablet (60 mg total) by mouth daily.    Dispense:  90 tablet    Refill:  3   simvastatin (ZOCOR) 20 MG tablet    Sig: Take 1 tablet (20 mg total) by mouth daily.    Dispense:  90 tablet    Refill:  3   Vitamin D, Ergocalciferol, (DRISDOL) 1.25 MG (50000 UNIT) CAPS capsule    Sig: Take 1 capsule (50,000 Units total) by mouth every 7 (seven) days.    Dispense:  13 capsule    Refill:  3   DULoxetine (CYMBALTA) 30 MG capsule    Sig: Take 1 capsule (30 mg total) by mouth daily.    Dispense:  90 capsule    Refill:  3   Monitor BP at home. Eliminate salt. DASH printed.  Follow-up: Return in about 6 months (around 12/20/2021).  Claretta Fraise, M.D.

## 2021-06-23 LAB — CMP14+EGFR
ALT: 13 IU/L (ref 0–32)
AST: 17 IU/L (ref 0–40)
Albumin/Globulin Ratio: 1.9 (ref 1.2–2.2)
Albumin: 4.4 g/dL (ref 3.7–4.7)
Alkaline Phosphatase: 90 IU/L (ref 44–121)
BUN/Creatinine Ratio: 13 (ref 12–28)
BUN: 11 mg/dL (ref 8–27)
Bilirubin Total: 0.4 mg/dL (ref 0.0–1.2)
CO2: 25 mmol/L (ref 20–29)
Calcium: 9.3 mg/dL (ref 8.7–10.3)
Chloride: 103 mmol/L (ref 96–106)
Creatinine, Ser: 0.82 mg/dL (ref 0.57–1.00)
Globulin, Total: 2.3 g/dL (ref 1.5–4.5)
Glucose: 109 mg/dL — ABNORMAL HIGH (ref 65–99)
Potassium: 4.2 mmol/L (ref 3.5–5.2)
Sodium: 141 mmol/L (ref 134–144)
Total Protein: 6.7 g/dL (ref 6.0–8.5)
eGFR: 75 mL/min/{1.73_m2} (ref >59)

## 2021-06-23 LAB — CBC WITH DIFFERENTIAL/PLATELET
Basophils Absolute: 0 10*3/uL (ref 0.0–0.2)
Basos: 1 %
EOS (ABSOLUTE): 0.2 10*3/uL (ref 0.0–0.4)
Eos: 3 %
Hematocrit: 38.3 % (ref 34.0–46.6)
Hemoglobin: 13.2 g/dL (ref 11.1–15.9)
Immature Grans (Abs): 0 10*3/uL (ref 0.0–0.1)
Immature Granulocytes: 0 %
Lymphocytes Absolute: 2.1 10*3/uL (ref 0.7–3.1)
Lymphs: 38 %
MCH: 32.5 pg (ref 26.6–33.0)
MCHC: 34.5 g/dL (ref 31.5–35.7)
MCV: 94 fL (ref 79–97)
Monocytes Absolute: 0.4 10*3/uL (ref 0.1–0.9)
Monocytes: 8 %
Neutrophils Absolute: 2.7 10*3/uL (ref 1.4–7.0)
Neutrophils: 50 %
Platelets: 212 10*3/uL (ref 150–450)
RBC: 4.06 x10E6/uL (ref 3.77–5.28)
RDW: 12.2 % (ref 11.7–15.4)
WBC: 5.5 10*3/uL (ref 3.4–10.8)

## 2021-06-23 LAB — LIPID PANEL
Chol/HDL Ratio: 2.9 ratio (ref 0.0–4.4)
Cholesterol, Total: 156 mg/dL (ref 100–199)
HDL: 54 mg/dL (ref >39)
LDL Chol Calc (NIH): 85 mg/dL (ref 0–99)
Triglycerides: 93 mg/dL (ref 0–149)
VLDL Cholesterol Cal: 17 mg/dL (ref 5–40)

## 2021-06-23 LAB — VITAMIN D 25 HYDROXY (VIT D DEFICIENCY, FRACTURES): Vit D, 25-Hydroxy: 43.4 ng/mL (ref 30.0–100.0)

## 2021-06-23 NOTE — Progress Notes (Signed)
Hello Kathryn Young,  Your lab result is normal and/or stable.Some minor variations that are not significant are commonly marked abnormal, but do not represent any medical problem for you.  Best regards, Bane Hagy, M.D.

## 2021-06-29 ENCOUNTER — Other Ambulatory Visit: Payer: Self-pay

## 2021-06-29 ENCOUNTER — Ambulatory Visit (INDEPENDENT_AMBULATORY_CARE_PROVIDER_SITE_OTHER): Payer: Medicare HMO | Admitting: *Deleted

## 2021-06-29 DIAGNOSIS — Z23 Encounter for immunization: Secondary | ICD-10-CM

## 2021-06-29 NOTE — Progress Notes (Signed)
Pt given Shingrix vaccine IM left deltoid and tolerated well.

## 2021-09-01 ENCOUNTER — Other Ambulatory Visit: Payer: Self-pay

## 2021-09-01 ENCOUNTER — Encounter: Payer: Self-pay | Admitting: Family Medicine

## 2021-09-01 ENCOUNTER — Ambulatory Visit (INDEPENDENT_AMBULATORY_CARE_PROVIDER_SITE_OTHER): Payer: Medicare HMO | Admitting: Family Medicine

## 2021-09-01 VITALS — BP 132/76 | HR 73 | Temp 97.3°F | Resp 20 | Ht 63.0 in | Wt 140.0 lb

## 2021-09-01 DIAGNOSIS — K1121 Acute sialoadenitis: Secondary | ICD-10-CM | POA: Diagnosis not present

## 2021-09-01 MED ORDER — AMOXICILLIN-POT CLAVULANATE 875-125 MG PO TABS: 1.0000 | ORAL_TABLET | Freq: Two times a day (BID) | ORAL | 0 refills | Status: DC

## 2021-09-01 MED ORDER — PREDNISONE 10 MG PO TABS: 10.0000 mg | ORAL_TABLET | Freq: Two times a day (BID) | ORAL | 0 refills | Status: AC

## 2021-09-01 NOTE — Progress Notes (Signed)
Subjective:  Patient ID: Kathryn Young, female    DOB: 09-03-1946  Age: 75 y.o. MRN: [id number]  CC: No chief complaint on file.   HPI [name] presents for 2-3 days of pain at left jaw line. Radiates to left ear. Chewing exacerbates it. Mouth is normally dry. No different recently. Good dental check up recently.   Depression screen [facility] 2/9 09/01/2021 06/22/2021 01/10/2021  Decreased Interest 0 0 0  Down, Depressed, Hopeless 0 0 0  PHQ - 2 Score 0 0 0  Altered sleeping 0 - -  Tired, decreased energy 0 - -  Change in appetite 0 - -  Feeling bad or failure about yourself  0 - -  Trouble concentrating 0 - -  Moving slowly or fidgety/restless 0 - -  Suicidal thoughts 0 - -  PHQ-9 Score 0 - -  Difficult doing work/chores Not difficult at all - -    History [name] has a past medical history of Brain infection, Breast mass, right, Hyperlipidemia, Hypertension, Meningitis, Neuromuscular disorder ([place]), and SUNCT (short unilateral neuralgiform headache, conjunctival inj/tear) (02/16/2017).   She has a past surgical history that includes Abdominal hysterectomy (1990); Kidney Stones; Breast surgery (Left, 1981); Breast lumpectomy with radioactive seed localization (Right, 03/27/2016); Breast lumpectomy (Right, 2017); Breast lumpectomy (Left); Cataract extraction (Bilateral, 2021); Breast biopsy (Right, 02/02/2016); Breast excisional biopsy (Right, 03/27/2016); and Breast excisional biopsy (Left).   Her family history includes Breast cancer in her mother; Cancer in her mother; Heart attack in her paternal grandfather; Hyperlipidemia in her son; Hypertension in her son; Other in her father; Pneumonia in her brother; Prostate cancer in her paternal grandfather.She reports that she has never smoked. She has never used smokeless tobacco. She reports that she does not drink alcohol and does not use drugs.    ROS Review of Systems  Constitutional:  Negative for appetite change, chills,  diaphoresis, fatigue and fever.  HENT:  Positive for ear pain. Negative for congestion, hearing loss, postnasal drip, rhinorrhea, sore throat and trouble swallowing.   Respiratory:  Negative for cough, chest tightness and shortness of breath.   Cardiovascular:  Negative for chest pain and palpitations.  Gastrointestinal:  Negative for abdominal pain.  Musculoskeletal:  Negative for arthralgias.  Skin:  Negative for rash.   Objective:  BP 132/76   Pulse 73   Temp (!) 97.3 F (36.3 C) (Temporal)   Resp 20   Ht 5\' 3"  (1.6 m)   Wt 140 lb (63.5 kg)   SpO2 97%   BMI 24.80 kg/m   BP Readings from Last 3 Encounters:  09/01/21 132/76  06/22/21 127/72  12/15/20 (!) 158/74    Wt Readings from Last 3 Encounters:  09/01/21 140 lb (63.5 kg)  06/22/21 140 lb 3.2 oz (63.6 kg)  12/15/20 138 lb 12.8 oz (63 kg)     Physical Exam Constitutional:      General: She is not in acute distress.    Appearance: She is well-developed.  HENT:     Head: Normocephalic and atraumatic.     Salivary Glands: Left salivary gland is diffusely enlarged (at inferior aspect of left parotid gland) and tender (left inferior aspect protid gland).     Right Ear: Tympanic membrane normal.     Left Ear: Tympanic membrane normal.     Nose: No congestion.     Mouth/Throat:     Mouth: Mucous membranes are moist.  Eyes:     Extraocular Movements: Extraocular movements intact.  Conjunctiva/sclera: Conjunctivae normal.     Pupils: Pupils are equal, round, and reactive to light.  Cardiovascular:     Rate and Rhythm: Normal rate and regular rhythm.  Pulmonary:     Breath sounds: Normal breath sounds.  Musculoskeletal:        General: Normal range of motion.  Skin:    General: Skin is warm and dry.  Neurological:     Mental Status: She is alert and oriented to person, place, and time.      Assessment & Plan:   Diagnoses and all orders for this visit:  Parotitis, acute  Other orders -      amoxicillin-clavulanate (AUGMENTIN) 875-125 MG tablet; Take 1 tablet by mouth 2 (two) times daily. Take all of this medication -     predniSONE (DELTASONE) 10 MG tablet; Take 1 tablet (10 mg total) by mouth 2 (two) times daily with a meal for 7 days.      I am having Allaya B. Winer start on amoxicillin-clavulanate and predniSONE. I am also having her maintain her Olopatadine HCl (PATADAY OP), Propylene Glycol (SYSTANE COMPLETE OP), calcium citrate-vitamin D, Zinc, celecoxib, gabapentin, pantoprazole, raloxifene, simvastatin, Vitamin D (Ergocalciferol), and DULoxetine.  Allergies as of 09/01/2021   No Known Allergies      Medication List        Accurate as of September 01, 2021  9:27 AM. If you have any questions, ask your nurse or doctor.          amoxicillin-clavulanate 875-125 MG tablet Commonly known as: AUGMENTIN Take 1 tablet by mouth 2 (two) times daily. Take all of this medication Started by: Claretta Fraise, MD   calcium citrate-vitamin D 315-200 MG-UNIT tablet Commonly known as: CITRACAL+D Take 1 tablet by mouth 2 (two) times daily.   celecoxib 200 MG capsule Commonly known as: CeleBREX Take 1 capsule (200 mg total) by mouth daily. With food   DULoxetine 30 MG capsule Commonly known as: CYMBALTA Take 1 capsule (30 mg total) by mouth daily.   gabapentin 300 MG capsule Commonly known as: NEURONTIN TAKE (1) OR (2) CAPSULES THREE TIMES DAILY.   pantoprazole 40 MG tablet Commonly known as: PROTONIX TAKE (1) TABLET DAILY FOR STOMACH   PATADAY OP Apply to eye.   predniSONE 10 MG tablet Commonly known as: DELTASONE Take 1 tablet (10 mg total) by mouth 2 (two) times daily with a meal for 7 days. Started by: Claretta Fraise, MD   raloxifene 60 MG tablet Commonly known as: EVISTA Take 1 tablet (60 mg total) by mouth daily.   simvastatin 20 MG tablet Commonly known as: ZOCOR Take 1 tablet (20 mg total) by mouth daily.   SYSTANE COMPLETE OP Apply to eye.    Vitamin D (Ergocalciferol) 1.25 MG (50000 UNIT) Caps capsule Commonly known as: DRISDOL Take 1 capsule (50,000 Units total) by mouth every 7 (seven) days.   Zinc 50 MG Tabs Take by mouth.       Pt. Will let me know at end of treatment if sx persist, will need ultrasound of left facial structures  Follow-up: Return if symptoms worsen or fail to improve.  Claretta Fraise, M.D.

## 2021-09-15 ENCOUNTER — Ambulatory Visit (INDEPENDENT_AMBULATORY_CARE_PROVIDER_SITE_OTHER): Payer: Medicare HMO | Admitting: *Deleted

## 2021-09-15 DIAGNOSIS — Z23 Encounter for immunization: Secondary | ICD-10-CM

## 2021-10-20 DIAGNOSIS — C44529 Squamous cell carcinoma of skin of other part of trunk: Secondary | ICD-10-CM | POA: Diagnosis not present

## 2021-10-20 DIAGNOSIS — D485 Neoplasm of uncertain behavior of skin: Secondary | ICD-10-CM | POA: Diagnosis not present

## 2021-12-14 ENCOUNTER — Other Ambulatory Visit: Payer: Self-pay | Admitting: Family Medicine

## 2021-12-14 DIAGNOSIS — Z1231 Encounter for screening mammogram for malignant neoplasm of breast: Secondary | ICD-10-CM

## 2021-12-20 ENCOUNTER — Encounter: Payer: Self-pay | Admitting: Family Medicine

## 2021-12-20 ENCOUNTER — Ambulatory Visit (INDEPENDENT_AMBULATORY_CARE_PROVIDER_SITE_OTHER): Payer: Medicare HMO | Admitting: Family Medicine

## 2021-12-20 ENCOUNTER — Ambulatory Visit (INDEPENDENT_AMBULATORY_CARE_PROVIDER_SITE_OTHER): Payer: Medicare HMO

## 2021-12-20 VITALS — BP 140/74 | HR 81 | Temp 97.5°F | Ht 63.0 in | Wt 135.4 lb

## 2021-12-20 DIAGNOSIS — M8588 Other specified disorders of bone density and structure, other site: Secondary | ICD-10-CM

## 2021-12-20 DIAGNOSIS — G500 Trigeminal neuralgia: Secondary | ICD-10-CM | POA: Diagnosis not present

## 2021-12-20 DIAGNOSIS — M8589 Other specified disorders of bone density and structure, multiple sites: Secondary | ICD-10-CM | POA: Diagnosis not present

## 2021-12-20 DIAGNOSIS — E559 Vitamin D deficiency, unspecified: Secondary | ICD-10-CM

## 2021-12-20 DIAGNOSIS — E782 Mixed hyperlipidemia: Secondary | ICD-10-CM

## 2021-12-20 NOTE — Patient Instructions (Signed)
DASH Eating Plan °DASH stands for Dietary Approaches to Stop Hypertension. The DASH eating plan is a healthy eating plan that has been shown to: °Reduce high blood pressure (hypertension). °Reduce your risk for type 2 diabetes, heart disease, and stroke. °Help with weight loss. °What are tips for following this plan? °Reading food labels °Check food labels for the amount of salt (sodium) per serving. Choose foods with less than 5 percent of the Daily Value of sodium. Generally, foods with less than 300 milligrams (mg) of sodium per serving fit into this eating plan. °To find whole grains, look for the word "whole" as the first word in the ingredient list. °Shopping °Buy products labeled as "low-sodium" or "no salt added." °Buy fresh foods. Avoid canned foods and pre-made or frozen meals. °Cooking °Avoid adding salt when cooking. Use salt-free seasonings or herbs instead of table salt or sea salt. Check with your health care provider or pharmacist before using salt substitutes. °Do not fry foods. Cook foods using healthy methods such as baking, boiling, grilling, roasting, and broiling instead. °Cook with heart-healthy oils, such as olive, canola, avocado, soybean, or sunflower oil. °Meal planning ° °Eat a balanced diet that includes: °4 or more servings of fruits and 4 or more servings of vegetables each day. Try to fill one-half of your plate with fruits and vegetables. °6-8 servings of whole grains each day. °Less than 6 oz (170 g) of lean meat, poultry, or fish each day. A 3-oz (85-g) serving of meat is about the same size as a deck of cards. One egg equals 1 oz (28 g). °2-3 servings of low-fat dairy each day. One serving is 1 cup (237 mL). °1 serving of nuts, seeds, or beans 5 times each week. °2-3 servings of heart-healthy fats. Healthy fats called omega-3 fatty acids are found in foods such as walnuts, flaxseeds, fortified milks, and eggs. These fats are also found in cold-water fish, such as sardines, salmon,  and mackerel. °Limit how much you eat of: °Canned or prepackaged foods. °Food that is high in trans fat, such as some fried foods. °Food that is high in saturated fat, such as fatty meat. °Desserts and other sweets, sugary drinks, and other foods with added sugar. °Full-fat dairy products. °Do not salt foods before eating. °Do not eat more than 4 egg yolks a week. °Try to eat at least 2 vegetarian meals a week. °Eat more home-cooked food and less restaurant, buffet, and fast food. °Lifestyle °When eating at a restaurant, ask that your food be prepared with less salt or no salt, if possible. °If you drink alcohol: °Limit how much you use to: °0-1 drink a day for women who are not pregnant. °0-2 drinks a day for men. °Be aware of how much alcohol is in your drink. In the [place] one drink equals one 12 oz bottle of beer (355 mL), one 5 oz glass of wine (148 mL), or one 1½ oz glass of hard liquor (44 mL). °General information °Avoid eating more than 2,300 mg of salt a day. If you have hypertension, you may need to reduce your sodium intake to 1,500 mg a day. °Work with your health care provider to maintain a healthy body weight or to lose weight. Ask what an ideal weight is for you. °Get at least 30 minutes of exercise that causes your heart to beat faster (aerobic exercise) most days of the week. Activities may include walking, swimming, or biking. °Work with your health care provider or dietitian to   adjust your eating plan to your individual calorie needs. °What foods should I eat? °Fruits °All fresh, dried, or frozen fruit. Canned fruit in natural juice (without added sugar). °Vegetables °Fresh or frozen vegetables (raw, steamed, roasted, or grilled). Low-sodium or reduced-sodium tomato and vegetable juice. Low-sodium or reduced-sodium tomato sauce and tomato paste. Low-sodium or reduced-sodium canned vegetables. °Grains °Whole-grain or whole-wheat bread. Whole-grain or whole-wheat pasta. Brown rice. Oatmeal. Quinoa.  Bulgur. Whole-grain and low-sodium cereals. Pita bread. Low-fat, low-sodium crackers. Whole-wheat flour tortillas. °Meats and other proteins °Skinless chicken or turkey. Ground chicken or turkey. Pork with fat trimmed off. Fish and seafood. Egg whites. Dried beans, peas, or lentils. Unsalted nuts, nut butters, and seeds. Unsalted canned beans. Lean cuts of beef with fat trimmed off. Low-sodium, lean precooked or cured meat, such as sausages or meat loaves. °Dairy °Low-fat (1%) or fat-free (skim) milk. Reduced-fat, low-fat, or fat-free cheeses. Nonfat, low-sodium ricotta or cottage cheese. Low-fat or nonfat yogurt. Low-fat, low-sodium cheese. °Fats and oils °Soft margarine without trans fats. Vegetable oil. Reduced-fat, low-fat, or light mayonnaise and salad dressings (reduced-sodium). Canola, safflower, olive, avocado, soybean, and sunflower oils. Avocado. °Seasonings and condiments °Herbs. Spices. Seasoning mixes without salt. °Other foods °Unsalted popcorn and pretzels. Fat-free sweets. °The items listed above may not be a complete list of foods and beverages you can eat. Contact a dietitian for more information. °What foods should I avoid? °Fruits °Canned fruit in a light or heavy syrup. Fried fruit. Fruit in cream or butter sauce. °Vegetables °Creamed or fried vegetables. Vegetables in a cheese sauce. Regular canned vegetables (not low-sodium or reduced-sodium). Regular canned tomato sauce and paste (not low-sodium or reduced-sodium). Regular tomato and vegetable juice (not low-sodium or reduced-sodium). Pickles. Olives. °Grains °Baked goods made with fat, such as croissants, muffins, or some breads. Dry pasta or rice meal packs. °Meats and other proteins °Fatty cuts of meat. Ribs. Fried meat. Bacon. Bologna, salami, and other precooked or cured meats, such as sausages or meat loaves. Fat from the back of a pig (fatback). Bratwurst. Salted nuts and seeds. Canned beans with added salt. Canned or smoked fish.  Whole eggs or egg yolks. Chicken or turkey with skin. °Dairy °Whole or 2% milk, cream, and half-and-half. Whole or full-fat cream cheese. Whole-fat or sweetened yogurt. Full-fat cheese. Nondairy creamers. Whipped toppings. Processed cheese and cheese spreads. °Fats and oils °Butter. Stick margarine. Lard. Shortening. Ghee. Bacon fat. Tropical oils, such as coconut, palm kernel, or palm oil. °Seasonings and condiments °Onion salt, garlic salt, seasoned salt, table salt, and sea salt. Worcestershire sauce. Tartar sauce. Barbecue sauce. Teriyaki sauce. Soy sauce, including reduced-sodium. Steak sauce. Canned and packaged gravies. Fish sauce. Oyster sauce. Cocktail sauce. Store-bought horseradish. Ketchup. Mustard. Meat flavorings and tenderizers. Bouillon cubes. Hot sauces. Pre-made or packaged marinades. Pre-made or packaged taco seasonings. Relishes. Regular salad dressings. °Other foods °Salted popcorn and pretzels. °The items listed above may not be a complete list of foods and beverages you should avoid. Contact a dietitian for more information. °Where to find more information °National Heart, Lung, and Blood Institute: www.nhlbi.nih.gov °American Heart Association: www.heart.org °Academy of Nutrition and Dietetics: www.eatright.org °National Kidney Foundation: www.kidney.org °Summary °The DASH eating plan is a healthy eating plan that has been shown to reduce high blood pressure (hypertension). It may also reduce your risk for type 2 diabetes, heart disease, and stroke. °When on the DASH eating plan, aim to eat more fresh fruits and vegetables, whole grains, lean proteins, low-fat dairy, and heart-healthy fats. °With the DASH   eating plan, you should limit salt (sodium) intake to 2,300 mg a day. If you have hypertension, you may need to reduce your sodium intake to 1,500 mg a day. °Work with your health care provider or dietitian to adjust your eating plan to your individual calorie needs. °This information is not  intended to replace advice given to you by your health care provider. Make sure you discuss any questions you have with your health care provider. °Document Revised: 09/05/2019 Document Reviewed: 09/05/2019 °Elsevier Patient Education © 2022 [employer] Inc. ° °

## 2021-12-20 NOTE — Progress Notes (Signed)
? ?Subjective:  ?Patient ID: Kathryn Young, female    DOB: Jul 11, 1946  Age: 76 y.o. MRN: [id number] ? ?CC: Medical Management of Chronic Issues ? ? ?HPI ?Kathryn Young presents for follow-up of elevated cholesterol. Doing well without complaints on current medication. Denies side effects of statin including myalgia and arthralgia and nausea. Also in today for liver function testing. Currently no chest pain, shortness of breath or other cardiovascular related symptoms noted. ? ?Patient in for follow-up of GERD. Currently asymptomatic taking  PPI daily. There is no chest pain or heartburn. No hematemesis and no melena. No dysphagia or choking. Onset is remote. Progression is stable. Complicating factors, none. ?Pt. Has low vit D. Taking replacement and evista. Not taking regular calcium.  ? ?The trigeminal neuralgia causing some itch. No sharp pains ? ? ? ?History ?Kathryn Young has a past medical history of Brain infection, Breast mass, right, Hyperlipidemia, Hypertension, Meningitis, Neuromuscular disorder ([place]), and SUNCT (short unilateral neuralgiform headache, conjunctival inj/tear) (02/16/2017).  ? ?She has a past surgical history that includes Abdominal hysterectomy (1990); Kidney Stones; Breast surgery (Left, 1981); Breast lumpectomy with radioactive seed localization (Right, 03/27/2016); Breast lumpectomy (Right, 2017); Breast lumpectomy (Left); Cataract extraction (Bilateral, 2021); Breast biopsy (Right, 02/02/2016); Breast excisional biopsy (Right, 03/27/2016); and Breast excisional biopsy (Left).  ? ?Her family history includes Breast cancer in her mother; Cancer in her mother; Heart attack in her paternal grandfather; Hyperlipidemia in her son; Hypertension in her son; Other in her father; Pneumonia in her brother; Prostate cancer in her paternal grandfather.She reports that she has never smoked. She has never used smokeless tobacco. She reports that she does not drink alcohol and does not use drugs. ? ?Current  Outpatient Medications on File Prior to Visit  ?Medication Sig Dispense Refill  ? calcium citrate-vitamin D (CITRACAL+D) 315-200 MG-UNIT tablet Take 1 tablet by mouth 2 (two) times daily.    ? gabapentin (NEURONTIN) 300 MG capsule TAKE (1) OR (2) CAPSULES THREE TIMES DAILY. 180 capsule 3  ? Olopatadine HCl (PATADAY OP) Apply to eye.    ? pantoprazole (PROTONIX) 40 MG tablet TAKE (1) TABLET DAILY FOR STOMACH 90 tablet 3  ? Propylene Glycol (SYSTANE COMPLETE OP) Apply to eye.    ? raloxifene (EVISTA) 60 MG tablet Take 1 tablet (60 mg total) by mouth daily. 90 tablet 3  ? simvastatin (ZOCOR) 20 MG tablet Take 1 tablet (20 mg total) by mouth daily. 90 tablet 3  ? Vitamin D, Ergocalciferol, (DRISDOL) 1.25 MG (50000 UNIT) CAPS capsule Take 1 capsule (50,000 Units total) by mouth every 7 (seven) days. 13 capsule 3  ? Zinc 50 MG TABS Take by mouth.    ? DULoxetine (CYMBALTA) 30 MG capsule Take 1 capsule (30 mg total) by mouth daily. 90 capsule 3  ? ?No current facility-administered medications on file prior to visit.  ? ? ?ROS ?Review of Systems  ?Constitutional: Negative.   ?HENT: Negative.    ?Eyes:  Negative for visual disturbance.  ?Respiratory:  Negative for shortness of breath.   ?Cardiovascular:  Negative for chest pain.  ?Gastrointestinal:  Negative for abdominal pain.  ?Musculoskeletal:  Negative for arthralgias.  ? ?Objective:  ?BP 140/74   Pulse 81   Temp (!) 97.5 ?F (36.4 ?C)   Ht '5\' 3"'  (1.6 m)   Wt 135 lb 6.4 oz (61.4 kg)   SpO2 (!) 84%   BMI 23.99 kg/m?  ? ?BP Readings from Last 3 Encounters:  ?12/20/21 140/74  ?09/01/21 132/76  ?06/22/21  127/72  ? ? ?Wt Readings from Last 3 Encounters:  ?12/20/21 135 lb 6.4 oz (61.4 kg)  ?09/01/21 140 lb (63.5 kg)  ?06/22/21 140 lb 3.2 oz (63.6 kg)  ? ? ? ?Physical Exam ?Constitutional:   ?   General: She is not in acute distress. ?   Appearance: She is well-developed.  ?Cardiovascular:  ?   Rate and Rhythm: Normal rate and regular rhythm.  ?Pulmonary:  ?   Breath  sounds: Normal breath sounds.  ?Musculoskeletal:     ?   General: Normal range of motion.  ?Skin: ?   General: Skin is warm and dry.  ?Neurological:  ?   Mental Status: She is alert and oriented to person, place, and time.  ? ? ?No results found for: HGBA1C ? ?Lab Results  ?Component Value Date  ? WBC 5.5 06/22/2021  ? HGB 13.2 06/22/2021  ? HCT 38.3 06/22/2021  ? PLT 212 06/22/2021  ? GLUCOSE 109 (H) 06/22/2021  ? CHOL 156 06/22/2021  ? TRIG 93 06/22/2021  ? HDL 54 06/22/2021  ? [place] 85 06/22/2021  ? ALT 13 06/22/2021  ? AST 17 06/22/2021  ? NA 141 06/22/2021  ? K 4.2 06/22/2021  ? CL 103 06/22/2021  ? CREATININE 0.82 06/22/2021  ? BUN 11 06/22/2021  ? CO2 25 06/22/2021  ? TSH 1.750 01/02/2018  ? ? ?MM 3D SCREEN BREAST BILATERAL ? ?Result Date: 01/13/2021 ?CLINICAL DATA:  Screening. EXAM: DIGITAL SCREENING BILATERAL MAMMOGRAM WITH TOMOSYNTHESIS AND CAD TECHNIQUE: Bilateral screening digital craniocaudal and mediolateral oblique mammograms were obtained. Bilateral screening digital breast tomosynthesis was performed. The images were evaluated with computer-aided detection. COMPARISON:  None. ACR Breast Density Category b: There are scattered areas of fibroglandular density. FINDINGS: There are no findings suspicious for malignancy. The images were evaluated with computer-aided detection. IMPRESSION: No mammographic evidence of malignancy. A result letter of this screening mammogram will be mailed directly to the patient. RECOMMENDATION: Screening mammogram in one year. (Code:SM-B-01Y) BI-RADS CATEGORY  1: Negative. Electronically Signed   By: Abelardo Diesel M.D.   On: 01/13/2021 13:59  ? ? ?Assessment & Plan:  ? ?Crystalee was seen today for medical management of chronic issues. ? ?Diagnoses and all orders for this visit: ? ?Mixed hyperlipidemia ?-     CBC with Differential/Platelet ?-     CMP14+EGFR ?-     Lipid panel ? ?Trigeminal neuralgia of right side of face ? ?Vitamin D deficiency ?-     VITAMIN D 25 Hydroxy  (Vit-D Deficiency, Fractures) ? ?Osteopenia of multiple sites ?-     DG Bone Density; Future ? ? ?I have discontinued Lumina B. Shambley's celecoxib and amoxicillin-clavulanate. I am also having her maintain her Olopatadine HCl (PATADAY OP), Propylene Glycol (SYSTANE COMPLETE OP), calcium citrate-vitamin D, Zinc, gabapentin, pantoprazole, raloxifene, simvastatin, Vitamin D (Ergocalciferol), and DULoxetine. ? ?No orders of the defined types were placed in this encounter. ?For BP needs to avoid salt - DASH ? ? ?Follow-up: Return in about 6 months (around 06/22/2022). ? ?Claretta Fraise, M.D. ?

## 2021-12-21 DIAGNOSIS — Z961 Presence of intraocular lens: Secondary | ICD-10-CM | POA: Diagnosis not present

## 2021-12-21 DIAGNOSIS — H1045 Other chronic allergic conjunctivitis: Secondary | ICD-10-CM | POA: Diagnosis not present

## 2021-12-21 DIAGNOSIS — H04123 Dry eye syndrome of bilateral lacrimal glands: Secondary | ICD-10-CM | POA: Diagnosis not present

## 2021-12-21 LAB — CMP14+EGFR
ALT: 10 IU/L (ref 0–32)
AST: 16 IU/L (ref 0–40)
Albumin/Globulin Ratio: 2.3 — ABNORMAL HIGH (ref 1.2–2.2)
Albumin: 4.6 g/dL (ref 3.7–4.7)
Alkaline Phosphatase: 88 IU/L (ref 44–121)
BUN/Creatinine Ratio: 12 (ref 12–28)
BUN: 10 mg/dL (ref 8–27)
Bilirubin Total: 0.5 mg/dL (ref 0.0–1.2)
CO2: 26 mmol/L (ref 20–29)
Calcium: 9.3 mg/dL (ref 8.7–10.3)
Chloride: 107 mmol/L — ABNORMAL HIGH (ref 96–106)
Creatinine, Ser: 0.86 mg/dL (ref 0.57–1.00)
Globulin, Total: 2 g/dL (ref 1.5–4.5)
Glucose: 115 mg/dL — ABNORMAL HIGH (ref 70–99)
Potassium: 4 mmol/L (ref 3.5–5.2)
Sodium: 146 mmol/L — ABNORMAL HIGH (ref 134–144)
Total Protein: 6.6 g/dL (ref 6.0–8.5)
eGFR: 70 mL/min/{1.73_m2} (ref >59)

## 2021-12-21 LAB — CBC WITH DIFFERENTIAL/PLATELET
Basophils Absolute: 0 10*3/uL (ref 0.0–0.2)
Basos: 0 %
EOS (ABSOLUTE): 0 10*3/uL (ref 0.0–0.4)
Eos: 1 %
Hematocrit: 37.4 % (ref 34.0–46.6)
Hemoglobin: 13.2 g/dL (ref 11.1–15.9)
Immature Grans (Abs): 0 10*3/uL (ref 0.0–0.1)
Immature Granulocytes: 0 %
Lymphocytes Absolute: 1.7 10*3/uL (ref 0.7–3.1)
Lymphs: 33 %
MCH: 33.2 pg — ABNORMAL HIGH (ref 26.6–33.0)
MCHC: 35.3 g/dL (ref 31.5–35.7)
MCV: 94 fL (ref 79–97)
Monocytes Absolute: 0.3 10*3/uL (ref 0.1–0.9)
Monocytes: 7 %
Neutrophils Absolute: 3.1 10*3/uL (ref 1.4–7.0)
Neutrophils: 59 %
Platelets: 205 10*3/uL (ref 150–450)
RBC: 3.98 x10E6/uL (ref 3.77–5.28)
RDW: 12 % (ref 11.7–15.4)
WBC: 5.2 10*3/uL (ref 3.4–10.8)

## 2021-12-21 LAB — LIPID PANEL
Chol/HDL Ratio: 3.1 ratio (ref 0.0–4.4)
Cholesterol, Total: 156 mg/dL (ref 100–199)
HDL: 51 mg/dL (ref >39)
LDL Chol Calc (NIH): 87 mg/dL (ref 0–99)
Triglycerides: 95 mg/dL (ref 0–149)
VLDL Cholesterol Cal: 18 mg/dL (ref 5–40)

## 2021-12-21 LAB — VITAMIN D 25 HYDROXY (VIT D DEFICIENCY, FRACTURES): Vit D, 25-Hydroxy: 62.2 ng/mL (ref 30.0–100.0)

## 2021-12-21 NOTE — Progress Notes (Signed)
Hello Salima,  Your lab result is normal and/or stable.Some minor variations that are not significant are commonly marked abnormal, but do not represent any medical problem for you.  Best regards, Jo-Ann Johanning, M.D.

## 2022-01-12 ENCOUNTER — Ambulatory Visit (INDEPENDENT_AMBULATORY_CARE_PROVIDER_SITE_OTHER): Payer: Medicare HMO

## 2022-01-12 VITALS — Wt 135.0 lb

## 2022-01-12 DIAGNOSIS — Z0000 Encounter for general adult medical examination without abnormal findings: Secondary | ICD-10-CM | POA: Diagnosis not present

## 2022-01-12 NOTE — Patient Instructions (Signed)
Ms. Medinger , ?Thank you for taking time to come for your Medicare Wellness Visit. I appreciate your ongoing commitment to your health goals. Please review the following plan we discussed and let me know if I can assist you in the future.  ? ?Screening recommendations/referrals: ?Colonoscopy: Done 07/07/2016 - no repeat required ?Mammogram: Done 01/12/2021 - Repeat annually *appointment 01/13/22 ?Bone Density: Done 12/20/21 - Repeat every 2 years ?Recommended yearly ophthalmology/optometry visit for glaucoma screening and checkup ?Recommended yearly dental visit for hygiene and checkup ? ?Vaccinations: ?Influenza vaccine: Done in 2022? - Repeat annually in fall ?Pneumococcal vaccine: Done 09/16/2011 & 02/18/2016 ?Tdap vaccine: Done 04/06/2010 - Repeat in 10 years * due now ?Shingles vaccine: Done 06/29/2021 & 09/15/2021   ?Covid-19:Done 12/30/19, 01/27/20, & 11/02/20 ? ?Advanced directives: Please bring a copy of your health care power of attorney and living will to the office to be added to your chart at your convenience.  ? ?Conditions/risks identified:  Keep up the great work! Aim for 30 minutes of exercise or brisk walking, 6-8 glasses of water, and 5 servings of fruits and vegetables each day.  ? ?Next appointment: Follow up in one year for your annual wellness visit  ? ? ?Preventive Care 48 Years and Older, Female ?Preventive care refers to lifestyle choices and visits with your health care provider that can promote health and wellness. ?What does preventive care include? ?A yearly physical exam. This is also called an annual well check. ?Dental exams once or twice a year. ?Routine eye exams. Ask your health care provider how often you should have your eyes checked. ?Personal lifestyle choices, including: ?Daily care of your teeth and gums. ?Regular physical activity. ?Eating a healthy diet. ?Avoiding tobacco and drug use. ?Limiting alcohol use. ?Practicing safe sex. ?Taking low-dose aspirin every day. ?Taking vitamin and  mineral supplements as recommended by your health care provider. ?What happens during an annual well check? ?The services and screenings done by your health care provider during your annual well check will depend on your age, overall health, lifestyle risk factors, and family history of disease. ?Counseling  ?Your health care provider may ask you questions about your: ?Alcohol use. ?Tobacco use. ?Drug use. ?Emotional well-being. ?Home and relationship well-being. ?Sexual activity. ?Eating habits. ?History of falls. ?Memory and ability to understand (cognition). ?Work and work Statistician. ?Reproductive health. ?Screening  ?You may have the following tests or measurements: ?Height, weight, and BMI. ?Blood pressure. ?Lipid and cholesterol levels. These may be checked every 5 years, or more frequently if you are over 90 years old. ?Skin check. ?Lung cancer screening. You may have this screening every year starting at age 5 if you have a 30-pack-year history of smoking and currently smoke or have quit within the past 15 years. ?Fecal occult blood test (FOBT) of the stool. You may have this test every year starting at age 21. ?Flexible sigmoidoscopy or colonoscopy. You may have a sigmoidoscopy every 5 years or a colonoscopy every 10 years starting at age 68. ?Hepatitis C blood test. ?Hepatitis B blood test. ?Sexually transmitted disease (STD) testing. ?Diabetes screening. This is done by checking your blood sugar (glucose) after you have not eaten for a while (fasting). You may have this done every 1-3 years. ?Bone density scan. This is done to screen for osteoporosis. You may have this done starting at age 24. ?Mammogram. This may be done every 1-2 years. Talk to your health care provider about how often you should have regular mammograms. ?Talk with your  health care provider about your test results, treatment options, and if necessary, the need for more tests. ?Vaccines  ?Your health care provider may recommend  certain vaccines, such as: ?Influenza vaccine. This is recommended every year. ?Tetanus, diphtheria, and acellular pertussis (Tdap, Td) vaccine. You may need a Td booster every 10 years. ?Zoster vaccine. You may need this after age 39. ?Pneumococcal 13-valent conjugate (PCV13) vaccine. One dose is recommended after age 53. ?Pneumococcal polysaccharide (PPSV23) vaccine. One dose is recommended after age 23. ?Talk to your health care provider about which screenings and vaccines you need and how often you need them. ?This information is not intended to replace advice given to you by your health care provider. Make sure you discuss any questions you have with your health care provider. ?Document Released: 10/29/2015 Document Revised: 06/21/2016 Document Reviewed: 08/03/2015 ?Elsevier Interactive Patient Education ? 2017 [employer]. ? ?Fall Prevention in the Home ?Falls can cause injuries. They can happen to people of all ages. There are many things you can do to make your home safe and to help prevent falls. ?What can I do on the outside of my home? ?Regularly fix the edges of walkways and driveways and fix any cracks. ?Remove anything that might make you trip as you walk through a door, such as a raised step or threshold. ?Trim any bushes or trees on the path to your home. ?Use bright outdoor lighting. ?Clear any walking paths of anything that might make someone trip, such as rocks or tools. ?Regularly check to see if handrails are loose or broken. Make sure that both sides of any steps have handrails. ?Any raised decks and porches should have guardrails on the edges. ?Have any leaves, snow, or ice cleared regularly. ?Use sand or salt on walking paths during winter. ?Clean up any spills in your garage right away. This includes oil or grease spills. ?What can I do in the bathroom? ?Use night lights. ?Install grab bars by the toilet and in the tub and shower. Do not use towel bars as grab bars. ?Use non-skid mats or  decals in the tub or shower. ?If you need to sit down in the shower, use a plastic, non-slip stool. ?Keep the floor dry. Clean up any water that spills on the floor as soon as it happens. ?Remove soap buildup in the tub or shower regularly. ?Attach bath mats securely with double-sided non-slip rug tape. ?Do not have throw rugs and other things on the floor that can make you trip. ?What can I do in the bedroom? ?Use night lights. ?Make sure that you have a light by your bed that is easy to reach. ?Do not use any sheets or blankets that are too big for your bed. They should not hang down onto the floor. ?Have a firm chair that has side arms. You can use this for support while you get dressed. ?Do not have throw rugs and other things on the floor that can make you trip. ?What can I do in the kitchen? ?Clean up any spills right away. ?Avoid walking on wet floors. ?Keep items that you use a lot in easy-to-reach places. ?If you need to reach something above you, use a strong step stool that has a grab bar. ?Keep electrical cords out of the way. ?Do not use floor polish or wax that makes floors slippery. If you must use wax, use non-skid floor wax. ?Do not have throw rugs and other things on the floor that can make you trip. ?What  can I do with my stairs? ?Do not leave any items on the stairs. ?Make sure that there are handrails on both sides of the stairs and use them. Fix handrails that are broken or loose. Make sure that handrails are as long as the stairways. ?Check any carpeting to make sure that it is firmly attached to the stairs. Fix any carpet that is loose or worn. ?Avoid having throw rugs at the top or bottom of the stairs. If you do have throw rugs, attach them to the floor with carpet tape. ?Make sure that you have a light switch at the top of the stairs and the bottom of the stairs. If you do not have them, ask someone to add them for you. ?What else can I do to help prevent falls? ?Wear shoes that: ?Do not  have high heels. ?Have rubber bottoms. ?Are comfortable and fit you well. ?Are closed at the toe. Do not wear sandals. ?If you use a stepladder: ?Make sure that it is fully opened. Do not climb a closed steplad

## 2022-01-12 NOTE — Progress Notes (Signed)
? ?Subjective:  ? Kathryn Young is a 76 y.o. female who presents for Medicare Annual (Subsequent) preventive examination. ? ?Virtual Visit via Telephone Note ? ?I connected with  Kathryn Young on 01/12/22 at  9:45 AM EDT by telephone and verified that I am speaking with the correct person using two identifiers. ? ?Location: ?Patient: Home ?Provider: [facility] ?Persons participating in the virtual visit: patient/Nurse Health Advisor ?  ?I discussed the limitations, risks, security and privacy concerns of performing an evaluation and management service by telephone and the availability of in person appointments. The patient expressed understanding and agreed to proceed. ? ?Interactive audio and video telecommunications were attempted between this nurse and patient, however failed, due to patient having technical difficulties OR patient did not have access to video capability.  We continued and completed visit with audio only. ? ?Some vital signs may be absent or patient reported.  ? ?Shawnay Bramel Dionne Ano, LPN  ? ?Review of Systems    ? ?Cardiac Risk Factors include: advanced age (>42mn, >>67women);dyslipidemia;hypertension;Other (see comment), Risk factor comments: mitral regurgitation ? ?   ?Objective:  ?  ?Today's Vitals  ? 01/12/22 0951  ?Weight: 135 lb (61.2 kg)  ? ?Body mass index is 23.91 kg/m?. ? ? ?  01/12/2022  ? 10:05 AM 01/10/2021  ?  9:58 AM 01/05/2020  ?  8:37 AM 06/03/2019  ?  2:18 PM 12/19/2017  ? 11:52 AM 03/27/2016  ?  8:17 AM 03/20/2016  ?  3:04 PM  ?Advanced Directives  ?Does Patient Have a Medical Advance Directive? Yes Yes Yes Yes Yes Yes Yes  ?Type of [place] will Living will Living will Living will Healthcare Power of [place] ?Does patient want to make changes to medical advance directive?  No - Patient declined  No - Patient declined  No - Patient declined   ?Copy of [place]? No - copy  requested        ?Would patient like information on creating a medical advance directive?  No - Patient declined       ? ? ?Current Medications (verified) ?Outpatient Encounter Medications as of 01/12/2022  ?Medication Sig  ? acetaminophen (TYLENOL) 500 MG tablet Take 500 mg by mouth every 6 (six) hours as needed.  ? calcium citrate-vitamin D (CITRACAL+D) 315-200 MG-UNIT tablet Take 1 tablet by mouth 2 (two) times daily.  ? gabapentin (NEURONTIN) 300 MG capsule TAKE (1) OR (2) CAPSULES THREE TIMES DAILY.  ? Olopatadine HCl (PATADAY OP) Apply to eye.  ? pantoprazole (PROTONIX) 40 MG tablet TAKE (1) TABLET DAILY FOR STOMACH  ? raloxifene (EVISTA) 60 MG tablet Take 1 tablet (60 mg total) by mouth daily.  ? simvastatin (ZOCOR) 20 MG tablet Take 1 tablet (20 mg total) by mouth daily.  ? Vitamin D, Ergocalciferol, (DRISDOL) 1.25 MG (50000 UNIT) CAPS capsule Take 1 capsule (50,000 Units total) by mouth every 7 (seven) days.  ? Zinc 50 MG TABS Take by mouth.  ? DULoxetine (CYMBALTA) 30 MG capsule Take 1 capsule (30 mg total) by mouth daily.  ? Propylene Glycol (SYSTANE COMPLETE OP) Apply to eye. (Patient not taking: Reported on 01/12/2022)  ? ?No facility-administered encounter medications on file as of 01/12/2022.  ? ? ?Allergies (verified) ?Patient has no known allergies.  ? ?History: ?Past Medical History:  ?Diagnosis Date  ? Brain infection   ? Age 76 ? Breast mass, right   ?  Hyperlipidemia   ? Hypertension   ? no meds  ? Meningitis   ? Age 75  ? Neuromuscular disorder ([place])   ? right shoulder with burning down arm  ? SUNCT (short unilateral neuralgiform headache, conjunctival inj/tear) 02/16/2017  ? ?Past Surgical History:  ?Procedure Laterality Date  ? ABDOMINAL HYSTERECTOMY  1990  ? BREAST BIOPSY Right 02/02/2016  ? BREAST EXCISIONAL BIOPSY Right 03/27/2016  ? BREAST EXCISIONAL BIOPSY Left   ? years ago  ? BREAST LUMPECTOMY Right 2017  ? BREAST LUMPECTOMY Left   ? pt states she is not sure of when or where believes it  was in [place].  ? BREAST LUMPECTOMY WITH RADIOACTIVE SEED LOCALIZATION Right 03/27/2016  ? Procedure: RIGHT BREAST LUMPECTOMYAFTER RADIOACTIVE SEED LOCALIZATION;  Surgeon: Jackolyn Confer, MD;  Location: [facility];  Service: General;  Laterality: Right;  RIGHT BREAST LUMPECTOMYAFTER RADIOACTIVE SEED LOCALIZATION  ? BREAST SURGERY Left 1981  ? benign cysts removed  ? CATARACT EXTRACTION Bilateral 2021  ? Kidney Stones    ? ?Family History  ?Problem Relation Age of Onset  ? Cancer Mother   ?     Breast  ? Breast cancer Mother   ? Other Father   ?     Drowned  ? Prostate cancer Paternal Grandfather   ? Heart attack Paternal Grandfather   ? Pneumonia Brother   ? Hypertension Son   ? Hyperlipidemia Son   ? ?Social History  ? ?Socioeconomic History  ? Marital status: Married  ?  Spouse name: [name]  ? Number of children: 1  ? Years of education: 53  ? Highest education level: High school graduate  ?Occupational History  ? Occupation: Retired  ?  Comment: textiles  ?Tobacco Use  ? Smoking status: Never  ? Smokeless tobacco: Never  ?Vaping Use  ? Vaping Use: Never used  ?Substance and Sexual Activity  ? Alcohol use: No  ? Drug use: No  ? Sexual activity: Yes  ?  Partners: Male  ?  Birth control/protection: Surgical  ?  Comment: Married  ?Other Topics Concern  ? Not on file  ?Social History Narrative  ? Lives at home w/ her husband and grandson  ? Right-handed  ? Caffeine: 2 cups of coffee and tea throughout the day  ? Son lives in her basement apartment  ? Great grandson lives in cabin next door.  ? ?Social Determinants of Health  ? ?Financial Resource Strain: Low Risk   ? Difficulty of Paying Living Expenses: Not hard at all  ?Food Insecurity: No Food Insecurity  ? Worried About Charity fundraiser in the Last Year: Never true  ? Ran Out of Food in the Last Year: Never true  ?Transportation Needs: No Transportation Needs  ? Lack of Transportation (Medical): No  ? Lack of Transportation (Non-Medical): No   ?Physical Activity: Sufficiently Active  ? Days of Exercise per Week: 3 days  ? Minutes of Exercise per Session: 60 min  ?Stress: No Stress Concern Present  ? Feeling of Stress : Not at all  ?Social Connections: Socially Integrated  ? Frequency of Communication with Friends and Family: More than three times a week  ? Frequency of Social Gatherings with Friends and Family: More than three times a week  ? Attends Religious Services: More than 4 times per year  ? Active Member of Clubs or Organizations: Yes  ? Attends Archivist Meetings: More than 4 times per year  ? Marital Status:  Married  ? ? ?Tobacco Counseling ?Counseling given: Not Answered ? ? ?Clinical Intake: ? ?Pre-visit preparation completed: Yes ? ?Pain : No/denies pain ? ?  ? ?BMI - recorded: 23.91 ?Nutritional Status: BMI of 19-24  Normal ?Nutritional Risks: None ?Diabetes: No ? ?How often do you need to have someone help you when you read instructions, pamphlets, or other written materials from your doctor or pharmacy?: 1 - Never ? ?Diabetic? no ? ?Interpreter Needed?: No ? ?Information entered by :: Vander Kueker, LPN ? ? ?Activities of Daily Living ? ?  01/12/2022  ? 10:04 AM  ?In your present state of health, do you have any difficulty performing the following activities:  ?Hearing? 0  ?Vision? 0  ?Difficulty concentrating or making decisions? 0  ?Walking or climbing stairs? 0  ?Dressing or bathing? 0  ?Doing errands, shopping? 0  ?Preparing Food and eating ? N  ?Using the Toilet? N  ?In the past six months, have you accidently leaked urine? N  ?Do you have problems with loss of bowel control? N  ?Managing your Medications? N  ?Managing your Finances? N  ?Housekeeping or managing your Housekeeping? N  ? ? ?Patient Care Team: ?Claretta Fraise, MD as PCP - General (Family Medicine) ?Arnoldo Lenis, MD as PCP - Cardiology (Cardiology) ?Warden Fillers, MD as Consulting Physician (Ophthalmology) ?Rolm Bookbinder, MD as Consulting Physician  (Dermatology) ?Steffanie Rainwater, DPM as Consulting Physician (Podiatry) ? ?Indicate any recent Medical Services you may have received from other than [facility] providers in the past year (date may be approximate). ? ?   ?A

## 2022-01-13 ENCOUNTER — Ambulatory Visit
Admission: RE | Admit: 2022-01-13 | Discharge: 2022-01-13 | Disposition: A | Discharge status: Home / Self Care | Payer: Medicare HMO | Source: Ambulatory Visit | Attending: Family Medicine | Admitting: Family Medicine

## 2022-01-13 DIAGNOSIS — Z1231 Encounter for screening mammogram for malignant neoplasm of breast: Secondary | ICD-10-CM

## 2022-02-21 NOTE — Progress Notes (Signed)
Cardiology Office Note    Date:  03/07/2022   ID:  Kathryn Young, Kathryn Young 1946-02-22, MRN [id number]   PCP:  Kathryn Young, [place]  Cardiologist:  Kathryn Dolly, MD   Advanced Practice Provider:  No care team member to display Electrophysiologist:  None   [phone]   No chief complaint on file.   History of Present Illness:  Kathryn Young is a 76 y.o. female with history of mild to moderate MR on echo in 2020, hypertension, HLD.  Patient last saw Dr. Harl Bowie 05/06/2020 and was asymptomatic.  Blood pressure was up that day but was normal at home and came down in the office.  He recommended continue monitoring.  Patient comes in for f/u. BP 144/80 at home but has been up at PCP.  Gets some frozen meals that are prepared by the county, bacon, some canned soups, some take out. Was walking 1 mile daily but not recently. She cleans the church weekly and does a lot of stairs. Denies chest pain, dyspnea, dizziness or palpitations. Blood work in 12/2021 reviewed.      Past Medical History:  Diagnosis Date   Brain infection    Age 66   Breast mass, right    Hyperlipidemia    Hypertension    no meds   Meningitis    Age 24   Neuromuscular disorder ([place])    right shoulder with burning down arm   SUNCT (short unilateral neuralgiform headache, conjunctival inj/tear) 02/16/2017    Past Surgical History:  Procedure Laterality Date   ABDOMINAL HYSTERECTOMY  1990   BREAST BIOPSY Right 02/02/2016   BREAST EXCISIONAL BIOPSY Right 03/27/2016   BREAST EXCISIONAL BIOPSY Left    years ago   BREAST LUMPECTOMY Right 2017   BREAST LUMPECTOMY Left    pt states she is not sure of when or where believes it was in [place].   BREAST LUMPECTOMY WITH RADIOACTIVE SEED LOCALIZATION Right 03/27/2016   Procedure: RIGHT BREAST [place];  Surgeon: Jackolyn Confer, MD;  Location: [facility];  Service: General;   Laterality: Right;  RIGHT BREAST LUMPECTOMYAFTER RADIOACTIVE SEED LOCALIZATION   BREAST SURGERY Left 1981   benign cysts removed   CATARACT EXTRACTION Bilateral 2021   Kidney Stones      Current Medications: Current Meds  Medication Sig   acetaminophen (TYLENOL) 500 MG tablet Take 500 mg by mouth every 6 (six) hours as needed.   amLODipine (NORVASC) 5 MG tablet Take 0.5 tablets (2.5 mg total) by mouth daily.   calcium citrate-vitamin D (CITRACAL+D) 315-200 MG-UNIT tablet Take 1 tablet by mouth 2 (two) times daily.   DULoxetine (CYMBALTA) 30 MG capsule Take 1 capsule (30 mg total) by mouth daily.   gabapentin (NEURONTIN) 300 MG capsule TAKE (1) OR (2) CAPSULES THREE TIMES DAILY.   Olopatadine HCl (PATADAY OP) Apply to eye.   pantoprazole (PROTONIX) 40 MG tablet TAKE (1) TABLET DAILY FOR STOMACH   Propylene Glycol (SYSTANE COMPLETE OP) Apply to eye.   raloxifene (EVISTA) 60 MG tablet Take 1 tablet (60 mg total) by mouth daily.   simvastatin (ZOCOR) 20 MG tablet Take 1 tablet (20 mg total) by mouth daily.   Vitamin D, Ergocalciferol, (DRISDOL) 1.25 MG (50000 UNIT) CAPS capsule Take 1 capsule (50,000 Units total) by mouth every 7 (seven) days.   Zinc 50 MG TABS Take by mouth.     Allergies:   Patient has no known  allergies.   Social History   Socioeconomic History   Marital status: Married    Spouse name: [name]   Number of children: 1   Years of education: 12   Highest education level: High school graduate  Occupational History   Occupation: Retired    Comment: Charity fundraiser  Tobacco Use   Smoking status: Never   Smokeless tobacco: Never  Vaping Use   Vaping Use: Never used  Substance and Sexual Activity   Alcohol use: No   Drug use: No   Sexual activity: Yes    Partners: Male    Birth control/protection: Surgical    Comment: Married  Other Topics Concern   Not on file  Social History Narrative   Lives at home w/ her husband and grandson   Right-handed   Caffeine: 2 cups  of coffee and tea throughout the day   Son lives in her basement apartment   [place] grandson lives in cabin next door.   Social Determinants of Health   Financial Resource Strain: Low Risk    Difficulty of Paying Living Expenses: Not hard at all  Food Insecurity: No Food Insecurity   Worried About Charity fundraiser in the Last Year: Never true   [place] in the Last Year: Never true  Transportation Needs: No Transportation Needs   Lack of Transportation (Medical): No   Lack of Transportation (Non-Medical): No  Physical Activity: Sufficiently Active   Days of Exercise per Week: 3 days   Minutes of Exercise per Session: 60 min  Stress: No Stress Concern Present   Feeling of Stress : Not at all  Social Connections: Socially Integrated   Frequency of Communication with Friends and Family: More than three times a week   Frequency of Social Gatherings with Friends and Family: More than three times a week   Attends Religious Services: More than 4 times per year   Active Member of [employer] or Organizations: Yes   Attends Music therapist: More than 4 times per year   Marital Status: Married     Family History:  The patient's  family history includes Breast cancer in her mother; Cancer in her mother; Heart attack in her paternal grandfather; Hyperlipidemia in her son; Hypertension in her son; Other in her father; Pneumonia in her brother; Prostate cancer in her paternal grandfather.   ROS:   Please see the history of present illness.    ROS All other systems reviewed and are negative.   PHYSICAL EXAM:   VS:  BP (!) 150/84   Pulse 76   Ht '5\' 3"'$  (1.6 m)   Wt 138 lb (62.6 kg)   SpO2 96%   BMI 24.45 kg/m   Physical Exam  GEN: Well nourished, well developed, in no acute distress  Neck: no JVD, carotid bruits, or masses Cardiac:RRR; 2/3 systolic murmur loudest at the apex Respiratory:  clear to auscultation bilaterally, normal work of breathing GI: soft, nontender,  nondistended, + BS Ext: without cyanosis, clubbing, or edema, Good distal pulses bilaterally Neuro:  Alert and Oriented x 3,  Psych: euthymic mood, full affect  Wt Readings from Last 3 Encounters:  03/07/22 138 lb (62.6 kg)  01/12/22 135 lb (61.2 kg)  12/20/21 135 lb 6.4 oz (61.4 kg)      Studies/Labs Reviewed:   EKG:  EKG is  ordered today.  The ekg ordered today demonstrates normal sinus rhythm, normal EKG  Recent Labs: 12/20/2021: ALT 10; BUN 10; Creatinine, Ser  0.86; Hemoglobin 13.2; Platelets 205; Potassium 4.0; Sodium 146   Lipid Panel    Component Value Date/Time   CHOL 156 12/20/2021 0837   TRIG 95 12/20/2021 0837   TRIG 115 06/03/2013 0957   HDL 51 12/20/2021 0837   HDL 61 06/03/2013 0957   CHOLHDL 3.1 12/20/2021 0837   LDLCALC 87 12/20/2021 0837   LDLCALC 75 06/03/2013 0957    Additional studies/ records that were reviewed today include:  Echo 08/2019 IMPRESSIONS     1. Left ventricular ejection fraction, by visual estimation, is 60 to  65%. The left ventricle has normal function. There is mildly increased  left ventricular hypertrophy.   2. Left ventricular diastolic parameters are indeterminate.   3. Global right ventricle has normal systolic function.The right  ventricular size is normal. No increase in right ventricular wall  thickness.   4. Left atrial size was mildly dilated.   5. Right atrial size was normal.   6. The mitral valve is abnormal. Mild to moderate mitral valve  regurgitation. No evidence of mitral stenosis.   7. 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 The MR jet is eccentric  and posterior, which may lead to underestimation. The MV/AV TVI ratio is 1  suggestiong mild to moderate MR.   8. The tricuspid valve is normal in structure. Tricuspid valve  regurgitation is mild.   9. The aortic valve is tricuspid. Aortic valve regurgitation is not  visualized. No evidence of aortic valve sclerosis or stenosis.  10. The pulmonic valve  was not well visualized. Pulmonic valve  regurgitation is not visualized.  11. Normal pulmonary artery systolic pressure.  12. The inferior vena cava is normal in size with greater than 50%  respiratory variability, suggesting right atrial pressure of 3 mmHg.  13. 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    Risk Assessment/Calculations:         ASSESSMENT:    1. Mitral valve insufficiency, unspecified etiology   2. Essential hypertension   3. Hyperlipidemia, unspecified hyperlipidemia type      PLAN:  In order of problems listed above:  Mitral regurgitation mild to moderate on echo 2020 still asymptomatic.  We will update 2D echo  HTN blood pressure has been running high including with PCP who recommended she take something.  We will start low-dose amlodipine 5 mg half a tablet daily.  [place] and is quite a drive to get here.  She will take her blood pressures at home and call us in 2 weeks with the readings.  We can make adjustments at that time.  Labs reviewed from March and all were stable.  2 g sodium diet and regular exercise discussed  HLD LDL 87 triglycerides 95 12/2021 on simvastatin 20 mg daily managed by PCP  Shared Decision Making/Informed Consent        Medication Adjustments/Labs and Tests Ordered: Current medicines are reviewed at length with the patient today.  Concerns regarding medicines are outlined above.  Medication changes, Labs and Tests ordered today are listed in the Patient Instructions below. Patient Instructions  Medication Instructions:   Start Amlodipine 5 mg Take 1/2 ( 2.5 mg) Tablet Daily   Call our office in 2 weeks with Blood Pressure Readings   *If you need a refill on your cardiac medications before your next appointment, please call your pharmacy*   Lab Work: NONE   If you have labs (blood work) drawn today and your tests are  completely normal, you will receive your results only by: MyChart  Message (if you have MyChart) OR A paper copy in the mail If you have any lab test that is abnormal or we need to change your treatment, we will call you to review the results.   Testing/Procedures: Your physician has requested that you have an echocardiogram. Echocardiography is a painless test that uses sound waves to create images of your heart. It provides your doctor with information about the size and shape of your heart and how well your heart's chambers and valves are working. This procedure takes approximately one hour. There are no restrictions for this procedure.    Follow-Up: At [facility], you and your health needs are our priority.  As part of our continuing mission to provide you with exceptional heart care, we have created designated Provider Care Teams.  These Care Teams include your primary Cardiologist (physician) and Advanced Practice Providers (APPs -  Physician Assistants and Nurse Practitioners) who all work together to provide you with the care you need, when you need it.  We recommend signing up for the patient portal called "MyChart".  Sign up information is provided on this After Visit Summary.  MyChart is used to connect with patients for Virtual Visits (Telemedicine).  Patients are able to view lab/test results, encounter notes, upcoming appointments, etc.  Non-urgent messages can be sent to your provider as well.   To learn more about what you can do with MyChart, go to [email or web address].    Your next appointment:   1 year(s)  The format for your next appointment:   In Person  Provider:   Carlyle Dolly, MD    Other Instructions Your physician discussed the importance of regular exercise and recommended that you start or continue a regular exercise program for good health.  Thank you for choosing [facility]!    Important Information About Sugar      Two Gram Sodium Diet 2000 mg  What is Sodium? Sodium is a mineral found  naturally in many foods. The most significant source of sodium in the diet is table salt, which is about 40% sodium.  Processed, convenience, and preserved foods also contain a large amount of sodium.  The body needs only 500 mg of sodium daily to function,  A normal diet provides more than enough sodium even if you do not use salt.  Why Limit Sodium? A build up of sodium in the body can cause thirst, increased blood pressure, shortness of breath, and water retention.  Decreasing sodium in the diet can reduce edema and risk of heart attack or stroke associated with high blood pressure.  Keep in mind that there are many other factors involved in these health problems.  Heredity, obesity, lack of exercise, cigarette smoking, stress and what you eat all play a role.  General Guidelines: Do not add salt at the table or in cooking.  One teaspoon of salt contains over 2 grams of sodium. Read food labels Avoid processed and convenience foods Ask your dietitian before eating any foods not dicussed in the menu planning guidelines Consult your physician if you wish to use a salt substitute or a sodium containing medication such as antacids.  Limit milk and milk products to 16 oz (2 cups) per day.  Shopping Hints: READ LABELS!! "Dietetic" does not necessarily mean low sodium. Salt and other sodium ingredients are often added to foods during processing.    Menu Planning Guidelines Food Group Choose More Often  Avoid  Beverages (see also the milk group All fruit juices, low-sodium, salt-free vegetables juices, low-sodium carbonated beverages Regular vegetable or tomato juices, commercially softened water used for drinking or cooking  Breads and Cereals Enriched white, wheat, rye and pumpernickel bread, hard rolls and dinner rolls; muffins, cornbread and waffles; most dry cereals, cooked cereal without added salt; unsalted crackers and breadsticks; low sodium or homemade bread crumbs Bread, rolls and crackers  with salted tops; quick breads; instant hot cereals; pancakes; commercial bread stuffing; self-rising flower and biscuit mixes; regular bread crumbs or cracker crumbs  Desserts and Sweets Desserts and sweets mad with mild should be within allowance Instant pudding mixes and cake mixes  Fats Butter or margarine; vegetable oils; unsalted salad dressings, regular salad dressings limited to 1 Tbs; light, sour and heavy cream Regular salad dressings containing bacon fat, bacon bits, and salt pork; snack dips made with instant soup mixes or processed cheese; salted nuts  Fruits Most fresh, frozen and canned fruits Fruits processed with salt or sodium-containing ingredient (some dried fruits are processed with sodium sulfites        Vegetables Fresh, frozen vegetables and low- sodium canned vegetables Regular canned vegetables, sauerkraut, pickled vegetables, and others prepared in brine; frozen vegetables in sauces; vegetables seasoned with ham, bacon or salt pork  Condiments, Sauces, Miscellaneous  Salt substitute with physician's approval; pepper, herbs, spices; vinegar, lemon or lime juice; hot pepper sauce; garlic powder, onion powder, low sodium soy sauce (1 Tbs.); low sodium condiments (ketchup, chili sauce, mustard) in limited amounts (1 tsp.) fresh ground horseradish; unsalted tortilla chips, pretzels, potato chips, popcorn, salsa (1/4 cup) Any seasoning made with salt including garlic salt, celery salt, onion salt, and seasoned salt; sea salt, rock salt, kosher salt; meat tenderizers; monosodium glutamate; mustard, regular soy sauce, barbecue, sauce, chili sauce, teriyaki sauce, steak sauce, Worcestershire sauce, and most flavored vinegars; canned gravy and mixes; regular condiments; salted snack foods, olives, picles, relish, horseradish sauce, catsup   Food preparation: Try these seasonings Meats:    Pork Sage, onion Serve with applesauce  Chicken Poultry seasoning, thyme, parsley Serve with  cranberry sauce  Lamb Curry powder, rosemary, garlic, thyme Serve with mint sauce or jelly  Veal Marjoram, basil Serve with current jelly, cranberry sauce  Beef Pepper, bay leaf Serve with dry mustard, unsalted chive butter  Fish [place] leaf, dill Serve with unsalted lemon butter, unsalted parsley butter  Vegetables:    Asparagus Lemon juice   Broccoli Lemon juice   Carrots Mustard dressing parsley, mint, nutmeg, glazed with unsalted butter and sugar   Green beans Marjoram, lemon juice, nutmeg,dill seed   Tomatoes Basil, marjoram, onion   Spice /blend for [employer]" 4 tsp ground thyme 1 tsp ground sage 3 tsp ground rosemary 4 tsp ground marjoram   Test your knowledge A product that says "Salt Free" may still contain sodium. True or False Garlic Powder and Hot Pepper Sauce an be used as alternative seasonings.True or False Processed foods have more sodium than fresh foods.  True or False Canned Vegetables have less sodium than froze True or False   WAYS TO DECREASE YOUR SODIUM INTAKE Avoid the use of added salt in cooking and at the table.  Table salt (and other prepared seasonings which contain salt) is probably one of the greatest sources of sodium in the diet.  Unsalted foods can gain flavor from the sweet, sour, and butter taste sensations of herbs and spices.  Instead of using salt for seasoning,  try the following seasonings with the foods listed.  Remember: how you use them to enhance natural food flavors is limited only by your creativity... Allspice-Meat, fish, eggs, fruit, peas, red and yellow vegetables Almond Extract-Fruit baked goods Anise Seed-Sweet breads, fruit, carrots, beets, cottage cheese, cookies (tastes like licorice) Basil-Meat, fish, eggs, vegetables, rice, vegetables salads, soups, sauces Bay Leaf-Meat, fish, stews, poultry Burnet-Salad, vegetables (cucumber-like flavor) Caraway Seed-Bread, cookies, cottage cheese, meat, vegetables, cheese, rice Cardamon-Baked  goods, fruit, soups Celery Powder or seed-Salads, salad dressings, sauces, meatloaf, soup, bread.Do not use  celery salt Chervil-Meats, salads, fish, eggs, vegetables, cottage cheese (parsley-like flavor) Chili Power-Meatloaf, chicken cheese, corn, eggplant, egg dishes Chives-Salads cottage cheese, egg dishes, soups, vegetables, sauces Cilantro-Salsa, casseroles Cinnamon-Baked goods, fruit, pork, lamb, chicken, carrots Cloves-Fruit, baked goods, fish, pot roast, green beans, beets, carrots Coriander-Pastry, cookies, meat, salads, cheese (lemon-orange flavor) Cumin-Meatloaf, fish,cheese, eggs, cabbage,fruit pie (caraway flavor) [employer], fruit, eggs, fish, poultry, cottage cheese, vegetables Dill Seed-Meat, cottage cheese, poultry, vegetables, fish, salads, bread Fennel Seed-Bread, cookies, apples, pork, eggs, fish, beets, cabbage, cheese, Licorice-like flavor Garlic-(buds or powder) Salads, meat, poultry, fish, bread, butter, vegetables, potatoes.Do not  use garlic salt Ginger-Fruit, vegetables, baked goods, meat, fish, poultry Horseradish Root-Meet, vegetables, butter Lemon Juice or Extract-Vegetables, fruit, tea, baked goods, fish salads Mace-Baked goods fruit, vegetables, fish, poultry (taste like nutmeg) Maple Extract-Syrups Marjoram-Meat, chicken, fish, vegetables, breads, green salads (taste like Sage) Mint-Tea, lamb, sherbet, vegetables, desserts, carrots, cabbage Mustard, Dry or Seed-Cheese, eggs, meats, vegetables, poultry Nutmeg-Baked goods, fruit, chicken, eggs, vegetables, desserts Onion Powder-Meat, fish, poultry, vegetables, cheese, eggs, bread, rice salads (Do not use   Onion salt) Orange Extract-Desserts, baked goods Oregano-Pasta, eggs, cheese, onions, pork, lamb, fish, chicken, vegetables, green salads Paprika-Meat, fish, poultry, eggs, cheese, vegetables Parsley Flakes-Butter, vegetables, meat fish, poultry, eggs, bread, salads (certain forms may   Contain  sodium Pepper-Meat fish, poultry, vegetables, eggs Peppermint Extract-Desserts, baked goods Poppy Seed-Eggs, bread, cheese, fruit dressings, baked goods, noodles, vegetables, cottage  [employer], poultry, meat, fish, cauliflower, turnips,eggs bread Saffron-Rice, bread, veal, chicken, fish, eggs Sage-Meat, fish, poultry, onions, eggplant, tomateos, pork, stews Savory-Eggs, salads, poultry, meat, rice, vegetables, soups, pork Tarragon-Meat, poultry, fish, eggs, butter, vegetables (licorice-like flavor)  Thyme-Meat, poultry, fish, eggs, vegetables, (clover-like flavor), sauces, soups Tumeric-Salads, butter, eggs, fish, rice, vegetables (saffron-like flavor) Vanilla Extract-Baked goods, candy Vinegar-Salads, vegetables, meat marinades Walnut Extract-baked goods, candy   2. Choose your Foods Wisely   The following is a list of foods to avoid which are high in sodium:  Meats-Avoid all smoked, canned, salt cured, dried and kosher meat and fish as well as Anchovies   [employer] meats:Bologna, Liverwurst, Pastrami Canned meat or fish  Marinated herring Caviar    Pepperoni Corned Beef   Pizza Dried chipped beef  Salami Frozen breaded fish or meat Salt pork Frankfurters or hot dogs  Sardines Gefilte fish   Sausage Ham (boiled ham, Proscuitto Smoked butt    spiced ham)   Spam      TV Dinners Vegetables Canned vegetables (Regular) Relish Canned mushrooms  Sauerkraut Olives    Tomato juice Pickles  Bakery and Dessert Products Canned puddings  Cream pies Cheesecake   Decorated cakes Cookies  Beverages/Juices Tomato juice, regular  Gatorade   V-8 vegetable juice, regular  Breads and Cereals Biscuit mixes   Salted potato chips, corn chips, pretzels Bread stuffing mixes  Salted crackers and rolls Pancake and waffle mixes Self-rising flour  Seasonings Accent  Meat sauces Barbecue sauce  Meat tenderizer Catsup    Monosodium  glutamate (MSG) Celery salt   Onion salt Chili sauce   Prepared mustard Garlic salt   Salt, seasoned salt, sea salt Gravy mixes   Soy sauce Horseradish   Steak sauce Ketchup   Tartar sauce Lite salt    Teriyaki sauce Marinade mixes   Worcestershire sauce  Others Baking powder   Cocoa and cocoa mixes Baking soda   Commercial casserole mixes Candy-caramels, chocolate  Dehydrated soups    Bars, fudge,nougats  Instant rice and pasta mixes Canned broth or soup  Maraschino cherries Cheese, aged and processed cheese and cheese spreads  Learning Assessment Quiz  Indicated T (for True) or F (for False) for each of the following statements:  _____ Fresh fruits and vegetables and unprocessed grains are generally low in sodium _____ Water may contain a considerable amount of sodium, depending on the source _____ You can always tell if a food is high in sodium by tasting it _____ Certain laxatives my be high in sodium and should be avoided unless prescribed   by a physician or pharmacist _____ Salt substitutes may be used freely by anyone on a sodium restricted diet _____ Sodium is present in table salt, food additives and as a natural component of   most foods _____ Table salt is approximately 90% sodium _____ Limiting sodium intake may help prevent excess fluid accumulation in the body _____ On a sodium-restricted diet, seasonings such as bouillon soy sauce, and    cooking wine should be used in place of table salt _____ On an ingredient list, a product which lists monosodium glutamate as the first   ingredient is an appropriate food to include on a low sodium diet  Circle the best answer(s) to the following statements (Hint: there may be more than one correct answer)  11. On a low-sodium diet, some acceptable snack items are:    A. Olives  F. Bean dip   K. Grapefruit juice    B. Salted Pretzels G. Commercial Popcorn   L. Canned peaches    C. Carrot Sticks  H. Bouillon   M. Unsalted  nuts   D. [place] fries  I. Peanut butter crackers N. Salami   E. Sweet pickles J. Tomato Juice   O. Pizza  12.  Seasonings that may be used freely on a reduced - sodium diet include   A. Lemon wedges F.Monosodium glutamate K. Celery seed    B.Soysauce   G. Pepper   L. Mustard powder   C. Sea salt  H. Cooking wine  M. Onion flakes   D. Vinegar  E. Prepared horseradish N. Salsa   E. Sage   J. Worcestershire sauce  O. [address]    Sumner Boast, PA-C  03/07/2022 12:42 PM    [facility] [place], [place], [place]  [address] Phone: [phone]; Fax: [phone]

## 2022-03-07 ENCOUNTER — Ambulatory Visit: Payer: Medicare HMO | Admitting: Physician Assistant

## 2022-03-07 ENCOUNTER — Encounter: Payer: Self-pay | Admitting: Physician Assistant

## 2022-03-07 VITALS — BP 150/84 | HR 76 | Ht 63.0 in | Wt 138.0 lb

## 2022-03-07 DIAGNOSIS — I10 Essential (primary) hypertension: Secondary | ICD-10-CM | POA: Diagnosis not present

## 2022-03-07 DIAGNOSIS — E785 Hyperlipidemia, unspecified: Secondary | ICD-10-CM

## 2022-03-07 DIAGNOSIS — I340 Nonrheumatic mitral (valve) insufficiency: Secondary | ICD-10-CM

## 2022-03-07 MED ORDER — AMLODIPINE BESYLATE 5 MG PO TABS
2.5000 mg | ORAL_TABLET | Freq: Every day | ORAL | 3 refills | Status: DC
Start: 2022-03-07 — End: 2022-12-25

## 2022-03-07 NOTE — Patient Instructions (Signed)
Medication Instructions:   Start Amlodipine 5 mg Take 1/2 ( 2.5 mg) Tablet Daily   Call our office in 2 weeks with Blood Pressure Readings   *If you need a refill on your cardiac medications before your next appointment, please call your pharmacy*   Lab Work: NONE   If you have labs (blood work) drawn today and your tests are completely normal, you will receive your results only by: [facility] (if you have MyChart) OR A paper copy in the mail If you have any lab test that is abnormal or we need to change your treatment, we will call you to review the results.   Testing/Procedures: Your physician has requested that you have an echocardiogram. Echocardiography is a painless test that uses sound waves to create images of your heart. It provides your doctor with information about the size and shape of your heart and how well your heart's chambers and valves are working. This procedure takes approximately one hour. There are no restrictions for this procedure.    Follow-Up: At [facility], you and your health needs are our priority.  As part of our continuing mission to provide you with exceptional heart care, we have created designated Provider Care Teams.  These Care Teams include your primary Cardiologist (physician) and Advanced Practice Providers (APPs -  Physician Assistants and Nurse Practitioners) who all work together to provide you with the care you need, when you need it.  We recommend signing up for the patient portal called "MyChart".  Sign up information is provided on this After Visit Summary.  MyChart is used to connect with patients for Virtual Visits (Telemedicine).  Patients are able to view lab/test results, encounter notes, upcoming appointments, etc.  Non-urgent messages can be sent to your provider as well.   To learn more about what you can do with MyChart, go to [email or web address].    Your next appointment:   1 year(s)  The format for your next  appointment:   In Person  Provider:   Carlyle Dolly, MD    Other Instructions Your physician discussed the importance of regular exercise and recommended that you start or continue a regular exercise program for good health.  Thank you for choosing [facility]!    Important Information About Sugar      Two Gram Sodium Diet 2000 mg  What is Sodium? Sodium is a mineral found naturally in many foods. The most significant source of sodium in the diet is table salt, which is about 40% sodium.  Processed, convenience, and preserved foods also contain a large amount of sodium.  The body needs only 500 mg of sodium daily to function,  A normal diet provides more than enough sodium even if you do not use salt.  Why Limit Sodium? A build up of sodium in the body can cause thirst, increased blood pressure, shortness of breath, and water retention.  Decreasing sodium in the diet can reduce edema and risk of heart attack or stroke associated with high blood pressure.  Keep in mind that there are many other factors involved in these health problems.  Heredity, obesity, lack of exercise, cigarette smoking, stress and what you eat all play a role.  General Guidelines: Do not add salt at the table or in cooking.  One teaspoon of salt contains over 2 grams of sodium. Read food labels Avoid processed and convenience foods Ask your dietitian before eating any foods not dicussed in the menu planning guidelines Consult your physician  if you wish to use a salt substitute or a sodium containing medication such as antacids.  Limit milk and milk products to 16 oz (2 cups) per day.  Shopping Hints: READ LABELS!! "Dietetic" does not necessarily mean low sodium. Salt and other sodium ingredients are often added to foods during processing.    Menu Planning Guidelines Food Group Choose More Often Avoid  Beverages (see also the milk group All fruit juices, low-sodium, salt-free vegetables  juices, low-sodium carbonated beverages Regular vegetable or tomato juices, commercially softened water used for drinking or cooking  Breads and Cereals Enriched white, wheat, rye and pumpernickel bread, hard rolls and dinner rolls; muffins, cornbread and waffles; most dry cereals, cooked cereal without added salt; unsalted crackers and breadsticks; low sodium or homemade bread crumbs Bread, rolls and crackers with salted tops; quick breads; instant hot cereals; pancakes; commercial bread stuffing; self-rising flower and biscuit mixes; regular bread crumbs or cracker crumbs  Desserts and Sweets Desserts and sweets mad with mild should be within allowance Instant pudding mixes and cake mixes  Fats Butter or margarine; vegetable oils; unsalted salad dressings, regular salad dressings limited to 1 Tbs; light, sour and heavy cream Regular salad dressings containing bacon fat, bacon bits, and salt pork; snack dips made with instant soup mixes or processed cheese; salted nuts  Fruits Most fresh, frozen and canned fruits Fruits processed with salt or sodium-containing ingredient (some dried fruits are processed with sodium sulfites        Vegetables Fresh, frozen vegetables and low- sodium canned vegetables Regular canned vegetables, sauerkraut, pickled vegetables, and others prepared in brine; frozen vegetables in sauces; vegetables seasoned with ham, bacon or salt pork  Condiments, Sauces, Miscellaneous  Salt substitute with physician's approval; pepper, herbs, spices; vinegar, lemon or lime juice; hot pepper sauce; garlic powder, onion powder, low sodium soy sauce (1 Tbs.); low sodium condiments (ketchup, chili sauce, mustard) in limited amounts (1 tsp.) fresh ground horseradish; unsalted tortilla chips, pretzels, potato chips, popcorn, salsa (1/4 cup) Any seasoning made with salt including garlic salt, celery salt, onion salt, and seasoned salt; sea salt, rock salt, kosher salt; meat tenderizers;  monosodium glutamate; mustard, regular soy sauce, barbecue, sauce, chili sauce, teriyaki sauce, steak sauce, Worcestershire sauce, and most flavored vinegars; canned gravy and mixes; regular condiments; salted snack foods, olives, picles, relish, horseradish sauce, catsup   Food preparation: Try these seasonings Meats:    Pork Sage, onion Serve with applesauce  Chicken Poultry seasoning, thyme, parsley Serve with cranberry sauce  Lamb Curry powder, rosemary, garlic, thyme Serve with mint sauce or jelly  Veal Marjoram, basil Serve with current jelly, cranberry sauce  Beef Pepper, bay leaf Serve with dry mustard, unsalted chive butter  Fish [place] leaf, dill Serve with unsalted lemon butter, unsalted parsley butter  Vegetables:    Asparagus Lemon juice   Broccoli Lemon juice   Carrots Mustard dressing parsley, mint, nutmeg, glazed with unsalted butter and sugar   Green beans Marjoram, lemon juice, nutmeg,dill seed   Tomatoes Basil, marjoram, onion   Spice /blend for [employer]" 4 tsp ground thyme 1 tsp ground sage 3 tsp ground rosemary 4 tsp ground marjoram   Test your knowledge A product that says "Salt Free" may still contain sodium. True or False Garlic Powder and Hot Pepper Sauce an be used as alternative seasonings.True or False Processed foods have more sodium than fresh foods.  True or False Canned Vegetables have less sodium than froze True or False   WAYS  TO DECREASE YOUR SODIUM INTAKE Avoid the use of added salt in cooking and at the table.  Table salt (and other prepared seasonings which contain salt) is probably one of the greatest sources of sodium in the diet.  Unsalted foods can gain flavor from the sweet, sour, and butter taste sensations of herbs and spices.  Instead of using salt for seasoning, try the following seasonings with the foods listed.  Remember: how you use them to enhance natural food flavors is limited only by your creativity... Allspice-Meat, fish, eggs,  fruit, peas, red and yellow vegetables Almond Extract-Fruit baked goods Anise Seed-Sweet breads, fruit, carrots, beets, cottage cheese, cookies (tastes like licorice) Basil-Meat, fish, eggs, vegetables, rice, vegetables salads, soups, sauces Bay Leaf-Meat, fish, stews, poultry Burnet-Salad, vegetables (cucumber-like flavor) Caraway Seed-Bread, cookies, cottage cheese, meat, vegetables, cheese, rice Cardamon-Baked goods, fruit, soups Celery Powder or seed-Salads, salad dressings, sauces, meatloaf, soup, bread.Do not use  celery salt Chervil-Meats, salads, fish, eggs, vegetables, cottage cheese (parsley-like flavor) Chili Power-Meatloaf, chicken cheese, corn, eggplant, egg dishes Chives-Salads cottage cheese, egg dishes, soups, vegetables, sauces Cilantro-Salsa, casseroles Cinnamon-Baked goods, fruit, pork, lamb, chicken, carrots Cloves-Fruit, baked goods, fish, pot roast, green beans, beets, carrots Coriander-Pastry, cookies, meat, salads, cheese (lemon-orange flavor) Cumin-Meatloaf, fish,cheese, eggs, cabbage,fruit pie (caraway flavor) [employer], fruit, eggs, fish, poultry, cottage cheese, vegetables Dill Seed-Meat, cottage cheese, poultry, vegetables, fish, salads, bread Fennel Seed-Bread, cookies, apples, pork, eggs, fish, beets, cabbage, cheese, Licorice-like flavor Garlic-(buds or powder) Salads, meat, poultry, fish, bread, butter, vegetables, potatoes.Do not  use garlic salt Ginger-Fruit, vegetables, baked goods, meat, fish, poultry Horseradish Root-Meet, vegetables, butter Lemon Juice or Extract-Vegetables, fruit, tea, baked goods, fish salads Mace-Baked goods fruit, vegetables, fish, poultry (taste like nutmeg) Maple Extract-Syrups Marjoram-Meat, chicken, fish, vegetables, breads, green salads (taste like Sage) Mint-Tea, lamb, sherbet, vegetables, desserts, carrots, cabbage Mustard, Dry or Seed-Cheese, eggs, meats, vegetables, poultry Nutmeg-Baked goods, fruit, chicken,  eggs, vegetables, desserts Onion Powder-Meat, fish, poultry, vegetables, cheese, eggs, bread, rice salads (Do not use   Onion salt) Orange Extract-Desserts, baked goods Oregano-Pasta, eggs, cheese, onions, pork, lamb, fish, chicken, vegetables, green salads Paprika-Meat, fish, poultry, eggs, cheese, vegetables Parsley Flakes-Butter, vegetables, meat fish, poultry, eggs, bread, salads (certain forms may   Contain sodium Pepper-Meat fish, poultry, vegetables, eggs Peppermint Extract-Desserts, baked goods Poppy Seed-Eggs, bread, cheese, fruit dressings, baked goods, noodles, vegetables, cottage  [employer], poultry, meat, fish, cauliflower, turnips,eggs bread Saffron-Rice, bread, veal, chicken, fish, eggs Sage-Meat, fish, poultry, onions, eggplant, tomateos, pork, stews Savory-Eggs, salads, poultry, meat, rice, vegetables, soups, pork Tarragon-Meat, poultry, fish, eggs, butter, vegetables (licorice-like flavor)  Thyme-Meat, poultry, fish, eggs, vegetables, (clover-like flavor), sauces, soups Tumeric-Salads, butter, eggs, fish, rice, vegetables (saffron-like flavor) Vanilla Extract-Baked goods, candy Vinegar-Salads, vegetables, meat marinades Walnut Extract-baked goods, candy   2. Choose your Foods Wisely   The following is a list of foods to avoid which are high in sodium:  Meats-Avoid all smoked, canned, salt cured, dried and kosher meat and fish as well as Anchovies   [employer] meats:Bologna, Liverwurst, Pastrami Canned meat or fish  Marinated herring Caviar    Pepperoni Corned Beef   Pizza Dried chipped beef  Salami Frozen breaded fish or meat Salt pork Frankfurters or hot dogs  Sardines Gefilte fish   Sausage Ham (boiled ham, Proscuitto Smoked butt    spiced ham)   Spam      TV Dinners Vegetables Canned vegetables (Regular) Relish Canned mushrooms  Sauerkraut Olives  Tomato juice Pickles  Bakery and Dessert  Products Canned puddings  Cream pies Cheesecake   Decorated cakes Cookies  Beverages/Juices Tomato juice, regular  Gatorade   V-8 vegetable juice, regular  Breads and Cereals Biscuit mixes   Salted potato chips, corn chips, pretzels Bread stuffing mixes  Salted crackers and rolls Pancake and waffle mixes Self-rising flour  Seasonings Accent    Meat sauces Barbecue sauce  Meat tenderizer Catsup    Monosodium glutamate (MSG) Celery salt   Onion salt Chili sauce   Prepared mustard Garlic salt   Salt, seasoned salt, sea salt Gravy mixes   Soy sauce Horseradish   Steak sauce Ketchup   Tartar sauce Lite salt    Teriyaki sauce Marinade mixes   Worcestershire sauce  Others Baking powder   Cocoa and cocoa mixes Baking soda   Commercial casserole mixes Candy-caramels, chocolate  Dehydrated soups    Bars, fudge,nougats  Instant rice and pasta mixes Canned broth or soup  Maraschino cherries Cheese, aged and processed cheese and cheese spreads  Learning Assessment Quiz  Indicated T (for True) or F (for False) for each of the following statements:  _____ Fresh fruits and vegetables and unprocessed grains are generally low in sodium _____ Water may contain a considerable amount of sodium, depending on the source _____ You can always tell if a food is high in sodium by tasting it _____ Certain laxatives my be high in sodium and should be avoided unless prescribed   by a physician or pharmacist _____ Salt substitutes may be used freely by anyone on a sodium restricted diet _____ Sodium is present in table salt, food additives and as a natural component of   most foods _____ Table salt is approximately 90% sodium _____ Limiting sodium intake may help prevent excess fluid accumulation in the body _____ On a sodium-restricted diet, seasonings such as bouillon soy sauce, and    cooking wine should be used in place of table salt _____ On an ingredient list, a product which lists monosodium  glutamate as the first   ingredient is an appropriate food to include on a low sodium diet  Circle the best answer(s) to the following statements (Hint: there may be more than one correct answer)  11. On a low-sodium diet, some acceptable snack items are:    A. Olives  F. Bean dip   K. Grapefruit juice    B. Salted Pretzels G. Commercial Popcorn   L. Canned peaches    C. Carrot Sticks  H. Bouillon   M. Unsalted nuts   D. [place] fries  I. Peanut butter crackers N. Salami   E. Sweet pickles J. Tomato Juice   O. Pizza  12.  Seasonings that may be used freely on a reduced - sodium diet include   A. Lemon wedges F.Monosodium glutamate K. Celery seed    B.Soysauce   G. Pepper   L. Mustard powder   C. Sea salt  H. Cooking wine  M. Onion flakes   D. Vinegar  E. Prepared horseradish N. Salsa   E. Sage   J. Worcestershire sauce  O. Chutney

## 2022-03-08 NOTE — Addendum Note (Signed)
Addended by: Levonne Hubert on: 03/08/2022 01:26 PM   Modules accepted: Orders

## 2022-03-15 ENCOUNTER — Encounter: Payer: Self-pay | Admitting: Physician Assistant

## 2022-03-31 ENCOUNTER — Ambulatory Visit (HOSPITAL_COMMUNITY)
Admission: RE | Admit: 2022-03-31 | Discharge: 2022-03-31 | Disposition: A | Discharge status: Home / Self Care | Payer: Medicare HMO | Source: Ambulatory Visit | Attending: Physician Assistant | Admitting: Physician Assistant

## 2022-03-31 DIAGNOSIS — I340 Nonrheumatic mitral (valve) insufficiency: Secondary | ICD-10-CM | POA: Insufficient documentation

## 2022-03-31 LAB — ECHOCARDIOGRAM COMPLETE
Area-P 1/2: 3.21 cm2
MV M vel: 6.17 m/s
MV Peak grad: 152.3 mmHg
Radius: 0.6 cm
S' Lateral: 2.9 cm

## 2022-03-31 NOTE — Progress Notes (Signed)
*  PRELIMINARY RESULTS* Echocardiogram 2D Echocardiogram has been performed.  Samuel Germany 03/31/2022, 2:17 PM

## 2022-05-02 DIAGNOSIS — L820 Inflamed seborrheic keratosis: Secondary | ICD-10-CM | POA: Diagnosis not present

## 2022-05-02 DIAGNOSIS — B078 Other viral warts: Secondary | ICD-10-CM | POA: Diagnosis not present

## 2022-05-02 DIAGNOSIS — C44311 Basal cell carcinoma of skin of nose: Secondary | ICD-10-CM | POA: Diagnosis not present

## 2022-05-02 DIAGNOSIS — L821 Other seborrheic keratosis: Secondary | ICD-10-CM | POA: Diagnosis not present

## 2022-05-02 DIAGNOSIS — D1801 Hemangioma of skin and subcutaneous tissue: Secondary | ICD-10-CM | POA: Diagnosis not present

## 2022-05-02 DIAGNOSIS — L723 Sebaceous cyst: Secondary | ICD-10-CM | POA: Diagnosis not present

## 2022-05-02 DIAGNOSIS — L84 Corns and callosities: Secondary | ICD-10-CM | POA: Diagnosis not present

## 2022-05-02 DIAGNOSIS — Z85828 Personal history of other malignant neoplasm of skin: Secondary | ICD-10-CM | POA: Diagnosis not present

## 2022-05-02 DIAGNOSIS — D485 Neoplasm of uncertain behavior of skin: Secondary | ICD-10-CM | POA: Diagnosis not present

## 2022-05-02 DIAGNOSIS — L814 Other melanin hyperpigmentation: Secondary | ICD-10-CM | POA: Diagnosis not present

## 2022-06-06 DIAGNOSIS — C44311 Basal cell carcinoma of skin of nose: Secondary | ICD-10-CM | POA: Diagnosis not present

## 2022-06-06 DIAGNOSIS — Z85828 Personal history of other malignant neoplasm of skin: Secondary | ICD-10-CM | POA: Diagnosis not present

## 2022-06-26 ENCOUNTER — Ambulatory Visit (INDEPENDENT_AMBULATORY_CARE_PROVIDER_SITE_OTHER): Payer: Medicare HMO | Admitting: Family Medicine

## 2022-06-26 ENCOUNTER — Encounter: Payer: Self-pay | Admitting: Family Medicine

## 2022-06-26 VITALS — BP 131/72 | HR 70 | Temp 97.7°F | Ht 63.0 in | Wt 138.6 lb

## 2022-06-26 DIAGNOSIS — R202 Paresthesia of skin: Secondary | ICD-10-CM | POA: Diagnosis not present

## 2022-06-26 DIAGNOSIS — E782 Mixed hyperlipidemia: Secondary | ICD-10-CM | POA: Diagnosis not present

## 2022-06-26 DIAGNOSIS — I340 Nonrheumatic mitral (valve) insufficiency: Secondary | ICD-10-CM

## 2022-06-26 DIAGNOSIS — E559 Vitamin D deficiency, unspecified: Secondary | ICD-10-CM | POA: Diagnosis not present

## 2022-06-26 DIAGNOSIS — G500 Trigeminal neuralgia: Secondary | ICD-10-CM | POA: Diagnosis not present

## 2022-06-26 DIAGNOSIS — M8589 Other specified disorders of bone density and structure, multiple sites: Secondary | ICD-10-CM | POA: Diagnosis not present

## 2022-06-26 DIAGNOSIS — K219 Gastro-esophageal reflux disease without esophagitis: Secondary | ICD-10-CM | POA: Diagnosis not present

## 2022-06-26 MED ORDER — GABAPENTIN 300 MG PO CAPS: ORAL_CAPSULE | ORAL | 3 refills | Status: DC

## 2022-06-26 MED ORDER — DULOXETINE HCL 30 MG PO CPEP: 30.0000 mg | ORAL_CAPSULE | Freq: Every day | ORAL | 3 refills | Status: DC

## 2022-06-26 MED ORDER — VITAMIN D (ERGOCALCIFEROL) 1.25 MG (50000 UNIT) PO CAPS: 50000.0000 [IU] | ORAL_CAPSULE | ORAL | 3 refills | Status: DC

## 2022-06-26 MED ORDER — SIMVASTATIN 20 MG PO TABS: 20.0000 mg | ORAL_TABLET | Freq: Every day | ORAL | 3 refills | Status: DC

## 2022-06-26 MED ORDER — PANTOPRAZOLE SODIUM 40 MG PO TBEC: DELAYED_RELEASE_TABLET | ORAL | 3 refills | Status: DC

## 2022-06-26 MED ORDER — RALOXIFENE HCL 60 MG PO TABS: 60.0000 mg | ORAL_TABLET | Freq: Every day | ORAL | 3 refills | Status: DC

## 2022-06-26 NOTE — Progress Notes (Signed)
Subjective:  Patient ID: Kathryn Young, female    DOB: 1946/02/02  Age: 76 y.o. MRN: [id number]  CC: Medical Management of Chronic Issues   HPI Kathryn Young presents for  presents for  follow-up of hypertension. Patient has no history of headache chest pain or shortness of breath or recent cough. Patient also denies symptoms of TIA such as focal numbness or weakness. Patient denies side effects from medication. States taking it regularly.  Patient in for follow-up of GERD. Currently asymptomatic taking  PPI daily. There is no chest pain or heartburn. No hematemesis and no melena. No dysphagia or choking. Onset is remote. Progression is stable. Complicating factors, none.  Right face is numb. Controlled with gabapentin once a day. More can cause grogginess.  Continues with evista. Bone Density noted with hip T score of -2.0        06/26/2022    7:59 AM 01/12/2022    9:58 AM 12/20/2021    7:55 AM  Depression screen PHQ 2/9  Decreased Interest 0 0 0  Down, Depressed, Hopeless 0 0 0  PHQ - 2 Score 0 0 0    History Kathryn Young has a past medical history of Brain infection, Breast mass, right, Hyperlipidemia, Hypertension, Meningitis, Neuromuscular disorder ([facility]), and SUNCT (short unilateral neuralgiform headache, conjunctival inj/tear) (02/16/2017).   She has a past surgical history that includes Abdominal hysterectomy (1990); Kidney Stones; Breast surgery (Left, 1981); Breast lumpectomy with radioactive seed localization (Right, 03/27/2016); Breast lumpectomy (Right, 2017); Breast lumpectomy (Left); Cataract extraction (Bilateral, 2021); Breast biopsy (Right, 02/02/2016); Breast excisional biopsy (Right, 03/27/2016); Breast excisional biopsy (Left); and Mohs surgery.   Her family history includes Breast cancer in her mother; Cancer in her mother; Heart attack in her paternal grandfather; Hyperlipidemia in her son; Hypertension in her son; Other in her father; Pneumonia in her brother; Prostate  cancer in her paternal grandfather.She reports that she has never smoked. She has never used smokeless tobacco. She reports that she does not drink alcohol and does not use drugs.    ROS Review of Systems  Objective:  BP 131/72   Pulse 70   Temp 97.7 F (36.5 C)   Ht '5\' 3"'  (1.6 m)   Wt 138 lb 9.6 oz (62.9 kg)   SpO2 96%   BMI 24.55 kg/m   BP Readings from Last 3 Encounters:  06/26/22 131/72  03/07/22 (!) 150/84  12/20/21 140/74    Wt Readings from Last 3 Encounters:  06/26/22 138 lb 9.6 oz (62.9 kg)  03/07/22 138 lb (62.6 kg)  01/12/22 135 lb (61.2 kg)     Physical Exam    Assessment & Plan:   Kathryn Young was seen today for medical management of chronic issues.  Diagnoses and all orders for this visit:  Mitral valve insufficiency, unspecified etiology -     CBC with Differential/Platelet -     CMP14+EGFR -     Lipid panel  Trigeminal neuralgia of right side of face -     CBC with Differential/Platelet -     CMP14+EGFR -     Lipid panel -     DULoxetine (CYMBALTA) 30 MG capsule; Take 1 capsule (30 mg total) by mouth daily.  Mixed hyperlipidemia -     CBC with Differential/Platelet -     CMP14+EGFR -     Lipid panel -     simvastatin (ZOCOR) 20 MG tablet; Take 1 tablet (20 mg total) by mouth daily.  Osteopenia of multiple sites -  CBC with Differential/Platelet -     CMP14+EGFR -     Lipid panel -     raloxifene (EVISTA) 60 MG tablet; Take 1 tablet (60 mg total) by mouth daily.  Notalgia paresthetica -     gabapentin (NEURONTIN) 300 MG capsule; TAKE (1) OR (2) CAPSULES at bedtime -     CBC with Differential/Platelet -     CMP14+EGFR -     Lipid panel -     DULoxetine (CYMBALTA) 30 MG capsule; Take 1 capsule (30 mg total) by mouth daily.  Vitamin D deficiency -     Vitamin D, Ergocalciferol, (DRISDOL) 1.25 MG (50000 UNIT) CAPS capsule; Take 1 capsule (50,000 Units total) by mouth every 7 (seven) days.  Gastroesophageal reflux disease without  esophagitis -     pantoprazole (PROTONIX) 40 MG tablet; TAKE (1) TABLET DAILY FOR STOMACH       I have changed Kathryn Young's gabapentin. I am also having her maintain her Olopatadine HCl (PATADAY OP), Propylene Glycol (SYSTANE COMPLETE OP), calcium citrate-vitamin D, Zinc, acetaminophen, amLODipine, DULoxetine, raloxifene, simvastatin, Vitamin D (Ergocalciferol), and pantoprazole.  Allergies as of 06/26/2022   No Known Allergies      Medication List        Accurate as of June 26, 2022  8:26 AM. If you have any questions, ask your nurse or doctor.          acetaminophen 500 MG tablet Commonly known as: TYLENOL Take 500 mg by mouth every 6 (six) hours as needed.   amLODipine 5 MG tablet Commonly known as: NORVASC Take 0.5 tablets (2.5 mg total) by mouth daily.   calcium citrate-vitamin D 315-200 MG-UNIT tablet Commonly known as: CITRACAL+D Take 1 tablet by mouth 2 (two) times daily.   DULoxetine 30 MG capsule Commonly known as: CYMBALTA Take 1 capsule (30 mg total) by mouth daily.   gabapentin 300 MG capsule Commonly known as: NEURONTIN TAKE (1) OR (2) CAPSULES at bedtime What changed: additional instructions Changed by: Claretta Fraise, MD   pantoprazole 40 MG tablet Commonly known as: PROTONIX TAKE (1) TABLET DAILY FOR STOMACH   PATADAY OP Apply to eye.   raloxifene 60 MG tablet Commonly known as: EVISTA Take 1 tablet (60 mg total) by mouth daily.   simvastatin 20 MG tablet Commonly known as: ZOCOR Take 1 tablet (20 mg total) by mouth daily.   SYSTANE COMPLETE OP Apply to eye.   Vitamin D (Ergocalciferol) 1.25 MG (50000 UNIT) Caps capsule Commonly known as: DRISDOL Take 1 capsule (50,000 Units total) by mouth every 7 (seven) days.   Zinc 50 MG Tabs Take by mouth.         Follow-up: No follow-ups on file.  Claretta Fraise, M.D.

## 2022-06-27 LAB — CBC WITH DIFFERENTIAL/PLATELET
Basophils Absolute: 0 10*3/uL (ref 0.0–0.2)
Basos: 1 %
EOS (ABSOLUTE): 0.1 10*3/uL (ref 0.0–0.4)
Eos: 2 %
Hematocrit: 38.1 % (ref 34.0–46.6)
Hemoglobin: 13.1 g/dL (ref 11.1–15.9)
Immature Grans (Abs): 0 10*3/uL (ref 0.0–0.1)
Immature Granulocytes: 0 %
Lymphocytes Absolute: 1.8 10*3/uL (ref 0.7–3.1)
Lymphs: 33 %
MCH: 32.2 pg (ref 26.6–33.0)
MCHC: 34.4 g/dL (ref 31.5–35.7)
MCV: 94 fL (ref 79–97)
Monocytes Absolute: 0.4 10*3/uL (ref 0.1–0.9)
Monocytes: 7 %
Neutrophils Absolute: 3.1 10*3/uL (ref 1.4–7.0)
Neutrophils: 57 %
Platelets: 227 10*3/uL (ref 150–450)
RBC: 4.07 x10E6/uL (ref 3.77–5.28)
RDW: 12.1 % (ref 11.7–15.4)
WBC: 5.4 10*3/uL (ref 3.4–10.8)

## 2022-06-27 LAB — CMP14+EGFR
ALT: 12 IU/L (ref 0–32)
AST: 13 IU/L (ref 0–40)
Albumin/Globulin Ratio: 2 (ref 1.2–2.2)
Albumin: 4.4 g/dL (ref 3.8–4.8)
Alkaline Phosphatase: 83 IU/L (ref 44–121)
BUN/Creatinine Ratio: 12 (ref 12–28)
BUN: 10 mg/dL (ref 8–27)
Bilirubin Total: 0.4 mg/dL (ref 0.0–1.2)
CO2: 25 mmol/L (ref 20–29)
Calcium: 9.3 mg/dL (ref 8.7–10.3)
Chloride: 105 mmol/L (ref 96–106)
Creatinine, Ser: 0.85 mg/dL (ref 0.57–1.00)
Globulin, Total: 2.2 g/dL (ref 1.5–4.5)
Glucose: 102 mg/dL — ABNORMAL HIGH (ref 70–99)
Potassium: 3.9 mmol/L (ref 3.5–5.2)
Sodium: 143 mmol/L (ref 134–144)
Total Protein: 6.6 g/dL (ref 6.0–8.5)
eGFR: 71 mL/min/{1.73_m2} (ref >59)

## 2022-06-27 LAB — LIPID PANEL
Chol/HDL Ratio: 3 ratio (ref 0.0–4.4)
Cholesterol, Total: 173 mg/dL (ref 100–199)
HDL: 57 mg/dL (ref >39)
LDL Chol Calc (NIH): 96 mg/dL (ref 0–99)
Triglycerides: 114 mg/dL (ref 0–149)
VLDL Cholesterol Cal: 20 mg/dL (ref 5–40)

## 2022-06-27 NOTE — Progress Notes (Signed)
Hello Kathryn Young,  Your lab result is normal and/or stable.Some minor variations that are not significant are commonly marked abnormal, but do not represent any medical problem for you.  Best regards, Claretta Fraise, M.D.

## 2022-08-23 ENCOUNTER — Encounter: Payer: Self-pay | Admitting: Cardiology

## 2022-08-23 ENCOUNTER — Ambulatory Visit: Payer: Medicare HMO | Attending: Cardiology | Admitting: Cardiology

## 2022-08-23 VITALS — BP 130/82 | HR 52 | Ht 63.0 in | Wt 141.2 lb

## 2022-08-23 DIAGNOSIS — I10 Essential (primary) hypertension: Secondary | ICD-10-CM | POA: Diagnosis not present

## 2022-08-23 DIAGNOSIS — I340 Nonrheumatic mitral (valve) insufficiency: Secondary | ICD-10-CM

## 2022-08-23 DIAGNOSIS — E782 Mixed hyperlipidemia: Secondary | ICD-10-CM

## 2022-08-23 NOTE — Progress Notes (Signed)
Clinical Summary Ms. Nemetz is a 76 y.o.female seen today for follow up of the following medical problems.    1. Mitral regurgitation - reports told 25 years ago had heart murmur     02/2018 echo: LVEF 24-09%, grade I diastolic dysfunction, moderate MR, bilateral MV leaflet prolapse   08/2019 echo: LVEF 60-65%, indet DDx, mild to mod MR - no recent symptoms  03/2022 echo: LVEF 65-70%, no WMAs, normal diastolic fxn, normal RV, at least moderate with bileaflet prolapse - no SOB/DOE, no recent edema   2.HTN - home bp's 130s/70s - started on norvasc 2.'5mg'$  daily 02/2022  3.Hyperlipidemia 06/2022 TC 173 TG 114 HDL 57 LDL 96  Past Medical History:  Diagnosis Date   Brain infection    Age 3   Breast mass, right    Hyperlipidemia    Hypertension    no meds   Meningitis    Age 64   Neuromuscular disorder ([place])    right shoulder with burning down arm   SUNCT (short unilateral neuralgiform headache, conjunctival inj/tear) 02/16/2017     No Known Allergies   Current Outpatient Medications  Medication Sig Dispense Refill   acetaminophen (TYLENOL) 500 MG tablet Take 500 mg by mouth every 6 (six) hours as needed.     amLODipine (NORVASC) 5 MG tablet Take 0.5 tablets (2.5 mg total) by mouth daily. 45 tablet 3   calcium citrate-vitamin D (CITRACAL+D) 315-200 MG-UNIT tablet Take 1 tablet by mouth 2 (two) times daily.     DULoxetine (CYMBALTA) 30 MG capsule Take 1 capsule (30 mg total) by mouth daily. 90 capsule 3   gabapentin (NEURONTIN) 300 MG capsule TAKE (1) OR (2) CAPSULES at bedtime 180 capsule 3   Olopatadine HCl (PATADAY OP) Apply to eye.     pantoprazole (PROTONIX) 40 MG tablet TAKE (1) TABLET DAILY FOR STOMACH 90 tablet 3   Propylene Glycol (SYSTANE COMPLETE OP) Apply to eye.     raloxifene (EVISTA) 60 MG tablet Take 1 tablet (60 mg total) by mouth daily. 90 tablet 3   simvastatin (ZOCOR) 20 MG tablet Take 1 tablet (20 mg total) by mouth daily. 90 tablet 3   Vitamin  D, Ergocalciferol, (DRISDOL) 1.25 MG (50000 UNIT) CAPS capsule Take 1 capsule (50,000 Units total) by mouth every 7 (seven) days. 13 capsule 3   Zinc 50 MG TABS Take by mouth.     No current facility-administered medications for this visit.     Past Surgical History:  Procedure Laterality Date   ABDOMINAL HYSTERECTOMY  1990   BREAST BIOPSY Right 02/02/2016   BREAST EXCISIONAL BIOPSY Right 03/27/2016   BREAST EXCISIONAL BIOPSY Left    years ago   BREAST LUMPECTOMY Right 2017   BREAST LUMPECTOMY Left    pt states she is not sure of when or where believes it was in [facility].   BREAST LUMPECTOMY WITH RADIOACTIVE SEED LOCALIZATION Right 03/27/2016   Procedure: RIGHT BREAST [place];  Surgeon: Jackolyn Confer, MD;  Location: [place];  Service: General;  Laterality: Right;  RIGHT BREAST LUMPECTOMYAFTER RADIOACTIVE SEED LOCALIZATION   BREAST SURGERY Left 1981   benign cysts removed   CATARACT EXTRACTION Bilateral 2021   Kidney Stones     MOHS SURGERY     nose, left thigh, back     No Known Allergies    Family History  Problem Relation Age of Onset   Cancer Mother  Breast   Breast cancer Mother    Other Father        Drowned   Prostate cancer Paternal Grandfather    Heart attack Paternal Grandfather    Pneumonia Brother    Hypertension Son    Hyperlipidemia Son      Social History Ms. Hiser reports that she has never smoked. She has never used smokeless tobacco. Ms. Goodine reports no history of alcohol use.   Review of Systems CONSTITUTIONAL: No weight loss, fever, chills, weakness or fatigue.  HEENT: Eyes: No visual loss, blurred vision, double vision or yellow sclerae.No hearing loss, sneezing, congestion, runny nose or sore throat.  SKIN: No rash or itching.  CARDIOVASCULAR: per hpi RESPIRATORY: No shortness of breath, cough or sputum.  GASTROINTESTINAL: No anorexia, nausea, vomiting or diarrhea.  No abdominal pain or blood.  GENITOURINARY: No burning on urination, no polyuria NEUROLOGICAL: No headache, dizziness, syncope, paralysis, ataxia, numbness or tingling in the extremities. No change in bowel or bladder control.  MUSCULOSKELETAL: No muscle, back pain, joint pain or stiffness.  LYMPHATICS: No enlarged nodes. No history of splenectomy.  PSYCHIATRIC: No history of depression or anxiety.  ENDOCRINOLOGIC: No reports of sweating, cold or heat intolerance. No polyuria or polydipsia.  Marland Kitchen   Physical Examination Today's Vitals   08/23/22 1122  BP: 130/82  Pulse: (!) 52  SpO2: 99%  Weight: 141 lb 3.2 oz (64 kg)  Height: '5\' 3"'$  (1.6 m)   Body mass index is 25.01 kg/m.  Gen: resting comfortably, no acute distress HEENT: no scleral icterus, pupils equal round and reactive, no palptable cervical adenopathy,  CV: RRR, 3/6 systolic murmur apex, no jvd Resp: Clear to auscultation bilaterally GI: abdomen is soft, non-tender, non-distended, normal bowel sounds, no hepatosplenomegaly MSK: extremities are warm, no edema.  Skin: warm, no rash Neuro:  no focal deficits Psych: appropriate affect   Diagnostic Studies  02/2018 echo Study Conclusions   - Left ventricle: The cavity size was normal. Wall thickness was   increased in a pattern of mild LVH. Systolic function was normal.   The estimated ejection fraction was in the range of 55% to 60%.   Wall motion was normal; there were no regional wall motion   abnormalities. Doppler parameters are consistent with abnormal   left ventricular relaxation (grade 1 diastolic dysfunction).   Doppler parameters are consistent with indeterminate ventricular   filling pressure. - Mitral valve: Mild bileaflet prolapse. Mildly thickened leaflets   . There was moderate regurgitation. - Left atrium: The atrium was severely dilated. - Tricuspid valve: There was mild regurgitation.     08/2019 echo IMPRESSIONS     1. Left ventricular ejection  fraction, by visual estimation, is 60 to  65%. The left ventricle has normal function. There is mildly increased  left ventricular hypertrophy.   2. Left ventricular diastolic parameters are indeterminate.   3. Global right ventricle has normal systolic function.The right  ventricular size is normal. No increase in right ventricular wall  thickness.   4. Left atrial size was mildly dilated.   5. Right atrial size was normal.   6. The mitral valve is abnormal. Mild to moderate mitral valve  regurgitation. No evidence of mitral stenosis.   7. 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 The MR jet is eccentric  and posterior, which may lead to underestimation. The MV/AV TVI ratio is 1  suggestiong mild to moderate MR.   8. The tricuspid valve is normal in structure. Tricuspid  valve  regurgitation is mild.   9. The aortic valve is tricuspid. Aortic valve regurgitation is not  visualized. No evidence of aortic valve sclerosis or stenosis.  10. The pulmonic valve was not well visualized. Pulmonic valve  regurgitation is not visualized.  11. Normal pulmonary artery systolic pressure.  12. The inferior vena cava is normal in size with greater than 50%  respiratory variability, suggesting right atrial pressure of 3 mmHg.  13. 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     03/2022 echo IMPRESSIONS     1. Left ventricular ejection fraction, by estimation, is 65 to 70%. The  left ventricle has normal function. The left ventricle has no regional  wall motion abnormalities. Left ventricular diastolic parameters were  normal.   2. Right ventricular systolic function is normal. The right ventricular  size is normal. There is normal pulmonary artery systolic pressure.   3. Left atrial size was moderately dilated.   4. 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 Multiple jets of MR, anterior  and posteriorly directed. Difficult to quantitate, appears at least  moderate in severity.  Consider TEE to further define mechanism and  severity of MR. The mitral valve is  myxomatous.   5. The aortic valve is tricuspid. Aortic valve regurgitation is not  visualized.   6. The inferior vena cava is normal in size with greater than 50%  respiratory variability, suggesting right atrial pressure of 3 mmHg.    Assessment and Plan    1. Mitral regurgitation - moderate by most recent echo, repeat echo 03/2023. No symptoms - continue to monitor   2. HTN - right at goal, continue current meds  3. Hyperlipidemia - at goal, continue current meds   Arnoldo Lenis, M.D.

## 2022-08-23 NOTE — Patient Instructions (Signed)
Medication Instructions:  Continue all current medications.   Labwork: none  Testing/Procedures: none  Follow-Up: 6 months   Any Other Special Instructions Will Be Listed Below (If Applicable).   If you need a refill on your cardiac medications before your next appointment, please call your pharmacy.  

## 2022-12-25 ENCOUNTER — Encounter: Payer: Self-pay | Admitting: Family Medicine

## 2022-12-25 ENCOUNTER — Ambulatory Visit (INDEPENDENT_AMBULATORY_CARE_PROVIDER_SITE_OTHER): Payer: Medicare HMO | Admitting: Family Medicine

## 2022-12-25 VITALS — BP 137/65 | HR 59 | Temp 97.4°F | Ht 63.0 in | Wt 140.2 lb

## 2022-12-25 DIAGNOSIS — E782 Mixed hyperlipidemia: Secondary | ICD-10-CM

## 2022-12-25 DIAGNOSIS — G500 Trigeminal neuralgia: Secondary | ICD-10-CM | POA: Diagnosis not present

## 2022-12-25 DIAGNOSIS — E559 Vitamin D deficiency, unspecified: Secondary | ICD-10-CM | POA: Diagnosis not present

## 2022-12-25 DIAGNOSIS — I10 Essential (primary) hypertension: Secondary | ICD-10-CM | POA: Diagnosis not present

## 2022-12-25 DIAGNOSIS — I340 Nonrheumatic mitral (valve) insufficiency: Secondary | ICD-10-CM | POA: Diagnosis not present

## 2022-12-25 MED ORDER — AMLODIPINE BESYLATE 5 MG PO TABS: 2.5000 mg | ORAL_TABLET | Freq: Every day | ORAL | 3 refills | Status: DC

## 2022-12-25 NOTE — Progress Notes (Addendum)
Subjective:  Patient ID: Kathryn Young, female    DOB: [birth year]  Age: 77 y.o. MRN: [id number]  CC: Medical Management of Chronic Issues   HPI PRINCESS LASSILA presents for  presents for  follow-up of hypertension. Patient has no history of headache chest pain or shortness of breath or recent cough. Patient also denies symptoms of TIA such as focal numbness or weakness. Patient denies side effects from medication. States taking it regularly.      12/25/2022    7:59 AM 06/26/2022    7:59 AM 01/12/2022    9:58 AM  Depression screen PHQ 2/9  Decreased Interest 0 0 0  Down, Depressed, Hopeless 0 0 0  PHQ - 2 Score 0 0 0    History Averyrose has a past medical history of Brain infection, Breast mass, right, Hyperlipidemia, Hypertension, Meningitis, Neuromuscular disorder ([place]), and SUNCT (short unilateral neuralgiform headache, conjunctival inj/tear) (02/16/2017).   She has a past surgical history that includes Abdominal hysterectomy (1990); Kidney Stones; Breast surgery (Left, 1981); Breast lumpectomy with radioactive seed localization (Right, 03/27/2016); Breast lumpectomy (Right, 2017); Breast lumpectomy (Left); Cataract extraction (Bilateral, 2021); Breast biopsy (Right, 02/02/2016); Breast excisional biopsy (Right, 03/27/2016); Breast excisional biopsy (Left); and Mohs surgery.   Her family history includes Breast cancer in her mother; Cancer in her mother; Heart attack in her paternal grandfather; Hyperlipidemia in her son; Hypertension in her son; Other in her father; Pneumonia in her brother; Prostate cancer in her paternal grandfather.She reports that she has never smoked. She has never been exposed to tobacco smoke. She has never used smokeless tobacco. She reports that she does not drink alcohol and does not use drugs.    ROS Review of Systems  Constitutional: Negative.   HENT: Negative.    Eyes:  Negative for visual disturbance.  Respiratory:  Negative for shortness of breath.    Cardiovascular:  Negative for chest pain.  Gastrointestinal:  Negative for abdominal pain.  Musculoskeletal:  Negative for arthralgias.    Objective:  BP 137/65   Pulse (!) 59   Temp (!) 97.4 F (36.3 C)   Ht '5\' 3"'$  (1.6 m)   Wt 140 lb 3.2 oz (63.6 kg)   SpO2 95%   BMI 24.84 kg/m   BP Readings from Last 3 Encounters:  12/25/22 137/65  08/23/22 130/82  06/26/22 131/72    Wt Readings from Last 3 Encounters:  12/25/22 140 lb 3.2 oz (63.6 kg)  08/23/22 141 lb 3.2 oz (64 kg)  06/26/22 138 lb 9.6 oz (62.9 kg)     Physical Exam Constitutional:      General: She is not in acute distress.    Appearance: She is well-developed.  Cardiovascular:     Rate and Rhythm: Normal rate and regular rhythm.     Heart sounds: Murmur heard.     Systolic murmur is present with a grade of 3/6.  Pulmonary:     Breath sounds: Normal breath sounds.  Musculoskeletal:        General: Normal range of motion.     Right lower leg: No edema.     Left lower leg: No edema.  Skin:    General: Skin is warm and dry.  Neurological:     Mental Status: She is alert and oriented to person, place, and time.       Assessment & Plan:   Xiomara was seen today for medical management of chronic issues.  Diagnoses and all orders for this visit:  Primary hypertension  Mixed hyperlipidemia -     CBC with Differential/Platelet -     CMP14+EGFR -     Lipid panel  Vitamin D deficiency -     CBC with Differential/Platelet -     CMP14+EGFR -     VITAMIN D 25 Hydroxy (Vit-D Deficiency, Fractures)  Mitral valve insufficiency, unspecified etiology  Trigeminal neuralgia of right side of face  Other orders -     amLODipine (NORVASC) 5 MG tablet; Take 0.5 tablets (2.5 mg total) by mouth daily.       I am having Kylah B. Farooq maintain her Olopatadine HCl (PATADAY OP), Propylene Glycol (SYSTANE COMPLETE OP), calcium citrate-vitamin D, Zinc, acetaminophen, gabapentin, DULoxetine, raloxifene,  simvastatin, Vitamin D (Ergocalciferol), pantoprazole, and amLODipine.  Allergies as of 12/25/2022   No Known Allergies      Medication List        Accurate as of December 25, 2022  8:52 AM. If you have any questions, ask your nurse or doctor.          acetaminophen 500 MG tablet Commonly known as: TYLENOL Take 500 mg by mouth every 6 (six) hours as needed.   amLODipine 5 MG tablet Commonly known as: NORVASC Take 0.5 tablets (2.5 mg total) by mouth daily.   calcium citrate-vitamin D 315-200 MG-UNIT tablet Commonly known as: CITRACAL+D Take 1 tablet by mouth 2 (two) times daily.   DULoxetine 30 MG capsule Commonly known as: CYMBALTA Take 1 capsule (30 mg total) by mouth daily.   gabapentin 300 MG capsule Commonly known as: NEURONTIN TAKE (1) OR (2) CAPSULES at bedtime   pantoprazole 40 MG tablet Commonly known as: PROTONIX TAKE (1) TABLET DAILY FOR STOMACH   PATADAY OP Apply to eye.   raloxifene 60 MG tablet Commonly known as: EVISTA Take 1 tablet (60 mg total) by mouth daily.   simvastatin 20 MG tablet Commonly known as: ZOCOR Take 1 tablet (20 mg total) by mouth daily.   SYSTANE COMPLETE OP Apply to eye.   Vitamin D (Ergocalciferol) 1.25 MG (50000 UNIT) Caps capsule Commonly known as: DRISDOL Take 1 capsule (50,000 Units total) by mouth every 7 (seven) days.   Zinc 50 MG Tabs Take by mouth.         Follow-up: Return in about 6 months (around 06/27/2023) for hypertension.  Claretta Fraise, M.D.

## 2022-12-26 LAB — LIPID PANEL
Chol/HDL Ratio: 3.3 ratio (ref 0.0–4.4)
Cholesterol, Total: 194 mg/dL (ref 100–199)
HDL: 58 mg/dL (ref >39)
LDL Chol Calc (NIH): 116 mg/dL — ABNORMAL HIGH (ref 0–99)
Triglycerides: 113 mg/dL (ref 0–149)
VLDL Cholesterol Cal: 20 mg/dL (ref 5–40)

## 2022-12-26 LAB — CBC WITH DIFFERENTIAL/PLATELET
Basophils Absolute: 0 10*3/uL (ref 0.0–0.2)
Basos: 0 %
EOS (ABSOLUTE): 0.1 10*3/uL (ref 0.0–0.4)
Eos: 2 %
Hematocrit: 38.5 % (ref 34.0–46.6)
Hemoglobin: 13.4 g/dL (ref 11.1–15.9)
Immature Grans (Abs): 0 10*3/uL (ref 0.0–0.1)
Immature Granulocytes: 0 %
Lymphocytes Absolute: 2.1 10*3/uL (ref 0.7–3.1)
Lymphs: 43 %
MCH: 33.1 pg — ABNORMAL HIGH (ref 26.6–33.0)
MCHC: 34.8 g/dL (ref 31.5–35.7)
MCV: 95 fL (ref 79–97)
Monocytes Absolute: 0.4 10*3/uL (ref 0.1–0.9)
Monocytes: 8 %
Neutrophils Absolute: 2.3 10*3/uL (ref 1.4–7.0)
Neutrophils: 47 %
Platelets: 208 10*3/uL (ref 150–450)
RBC: 4.05 x10E6/uL (ref 3.77–5.28)
RDW: 12.2 % (ref 11.7–15.4)
WBC: 4.8 10*3/uL (ref 3.4–10.8)

## 2022-12-26 LAB — CMP14+EGFR
ALT: 10 IU/L (ref 0–32)
AST: 15 IU/L (ref 0–40)
Albumin/Globulin Ratio: 2 (ref 1.2–2.2)
Albumin: 4.3 g/dL (ref 3.8–4.8)
Alkaline Phosphatase: 101 IU/L (ref 44–121)
BUN/Creatinine Ratio: 12 (ref 12–28)
BUN: 12 mg/dL (ref 8–27)
Bilirubin Total: 0.4 mg/dL (ref 0.0–1.2)
CO2: 24 mmol/L (ref 20–29)
Calcium: 9.5 mg/dL (ref 8.7–10.3)
Chloride: 104 mmol/L (ref 96–106)
Creatinine, Ser: 0.99 mg/dL (ref 0.57–1.00)
Globulin, Total: 2.1 g/dL (ref 1.5–4.5)
Glucose: 105 mg/dL — ABNORMAL HIGH (ref 70–99)
Potassium: 4.1 mmol/L (ref 3.5–5.2)
Sodium: 141 mmol/L (ref 134–144)
Total Protein: 6.4 g/dL (ref 6.0–8.5)
eGFR: 59 mL/min/{1.73_m2} — ABNORMAL LOW (ref >59)

## 2022-12-26 LAB — VITAMIN D 25 HYDROXY (VIT D DEFICIENCY, FRACTURES): Vit D, 25-Hydroxy: 45.3 ng/mL (ref 30.0–100.0)

## 2022-12-27 NOTE — Progress Notes (Signed)
Hello Alette,  Your lab result is normal and/or stable.Some minor variations that are not significant are commonly marked abnormal, but do not represent any medical problem for you.  Best regards, Sem Mccaughey, M.D.

## 2023-01-16 ENCOUNTER — Ambulatory Visit (INDEPENDENT_AMBULATORY_CARE_PROVIDER_SITE_OTHER): Payer: Medicare HMO

## 2023-01-16 VITALS — Ht 63.0 in | Wt 140.0 lb

## 2023-01-16 DIAGNOSIS — Z0000 Encounter for general adult medical examination without abnormal findings: Secondary | ICD-10-CM

## 2023-01-16 NOTE — Patient Instructions (Signed)
[name] , Thank you for taking time to come for your Medicare Wellness Visit. I appreciate your ongoing commitment to your health goals. Please review the following plan we discussed and let me know if I can assist you in the future.   These are the goals we discussed:  Goals      DIET - INCREASE WATER INTAKE     Try to drink 6-8 glasses of water daily.     Exercise 3x per week (30 min per time)     Resume walking 3 times per week for 30 minutes     Patient Stated     01/05/2020 AWV Goal: Exercise for General Health  Patient will verbalize understanding of the benefits of increased physical activity: Exercising regularly is important. It will improve your overall fitness, flexibility, and endurance. Regular exercise also will improve your overall health. It can help you control your weight, reduce stress, and improve your bone density. Over the next year, patient will increase physical activity as tolerated with a goal of at least 150 minutes of moderate physical activity per week.  You can tell that you are exercising at a moderate intensity if your heart starts beating faster and you start breathing faster but can still hold a conversation. Moderate-intensity exercise ideas include: Walking 1 mile (1.6 km) in about 15 minutes Biking Hiking Golfing Dancing Water aerobics Patient will verbalize understanding of everyday activities that increase physical activity by providing examples like the following: Yard work, such as: Sales promotion account executive Gardening Washing windows or floors Patient will be able to explain general safety guidelines for exercising:  Before you start a new exercise program, talk with your health care provider. Do not exercise so much that you hurt yourself, feel dizzy, or get very short of breath. Wear comfortable clothes and wear shoes with good support. Drink plenty of water  while you exercise to prevent dehydration or heat stroke. Work out until your breathing and your heartbeat get faster.         This is a list of the screening recommended for you and due dates:  Health Maintenance  Topic Date Due   DTaP/Tdap/Td vaccine (2 - Td or Tdap) 04/06/2020   COVID-19 Vaccine (4 - 2023-24 season) 06/16/2022   Mammogram  01/14/2023   Flu Shot  05/17/2023   DEXA scan (bone density measurement)  12/21/2023   Medicare Annual Wellness Visit  01/16/2024   Pneumonia Vaccine  Completed   Hepatitis C Screening: USPSTF Recommendation to screen - Ages 18-79 yo.  Completed   Zoster (Shingles) Vaccine  Completed   HPV Vaccine  Aged Out   Colon Cancer Screening  Discontinued    Advanced directives: Please bring a copy of your health care power of attorney and living will to the office to be added to your chart at your convenience.   Conditions/risks identified: Aim for 30 minutes of exercise or brisk walking, 6-8 glasses of water, and 5 servings of fruits and vegetables each day.   Next appointment: Follow up in one year for your annual wellness visit    Preventive Care 65 Years and Older, Female Preventive care refers to lifestyle choices and visits with your health care provider that can promote health and wellness. What does preventive care include? A yearly physical exam. This is also called an annual well check. Dental exams once or twice a year. Routine eye exams. Ask your  health care provider how often you should have your eyes checked. Personal lifestyle choices, including: Daily care of your teeth and gums. Regular physical activity. Eating a healthy diet. Avoiding tobacco and drug use. Limiting alcohol use. Practicing safe sex. Taking low-dose aspirin every day. Taking vitamin and mineral supplements as recommended by your health care provider. What happens during an annual well check? The services and screenings done by your health care provider during  your annual well check will depend on your age, overall health, lifestyle risk factors, and family history of disease. Counseling  Your health care provider may ask you questions about your: Alcohol use. Tobacco use. Drug use. Emotional well-being. Home and relationship well-being. Sexual activity. Eating habits. History of falls. Memory and ability to understand (cognition). Work and work Statistician. Reproductive health. Screening  You may have the following tests or measurements: Height, weight, and BMI. Blood pressure. Lipid and cholesterol levels. These may be checked every 5 years, or more frequently if you are over 72 years old. Skin check. Lung cancer screening. You may have this screening every year starting at age 101 if you have a 30-pack-year history of smoking and currently smoke or have quit within the past 15 years. Fecal occult blood test (FOBT) of the stool. You may have this test every year starting at age 54. Flexible sigmoidoscopy or colonoscopy. You may have a sigmoidoscopy every 5 years or a colonoscopy every 10 years starting at age 55. Hepatitis C blood test. Hepatitis B blood test. Sexually transmitted disease (STD) testing. Diabetes screening. This is done by checking your blood sugar (glucose) after you have not eaten for a while (fasting). You may have this done every 1-3 years. Bone density scan. This is done to screen for osteoporosis. You may have this done starting at age 18. Mammogram. This may be done every 1-2 years. Talk to your health care provider about how often you should have regular mammograms. Talk with your health care provider about your test results, treatment options, and if necessary, the need for more tests. Vaccines  Your health care provider may recommend certain vaccines, such as: Influenza vaccine. This is recommended every year. Tetanus, diphtheria, and acellular pertussis (Tdap, Td) vaccine. You may need a Td booster every 10  years. Zoster vaccine. You may need this after age 57. Pneumococcal 13-valent conjugate (PCV13) vaccine. One dose is recommended after age 22. Pneumococcal polysaccharide (PPSV23) vaccine. One dose is recommended after age 58. Talk to your health care provider about which screenings and vaccines you need and how often you need them. This information is not intended to replace advice given to you by your health care provider. Make sure you discuss any questions you have with your health care provider. Document Released: 10/29/2015 Document Revised: 06/21/2016 Document Reviewed: 08/03/2015 Elsevier Interactive Patient Education  2017 [employer] Prevention in the Home Falls can cause injuries. They can happen to people of all ages. There are many things you can do to make your home safe and to help prevent falls. What can I do on the outside of my home? Regularly fix the edges of walkways and driveways and fix any cracks. Remove anything that might make you trip as you walk through a door, such as a raised step or threshold. Trim any bushes or trees on the path to your home. Use bright outdoor lighting. Clear any walking paths of anything that might make someone trip, such as rocks or tools. Regularly check to see if  handrails are loose or broken. Make sure that both sides of any steps have handrails. Any raised decks and porches should have guardrails on the edges. Have any leaves, snow, or ice cleared regularly. Use sand or salt on walking paths during winter. Clean up any spills in your garage right away. This includes oil or grease spills. What can I do in the bathroom? Use night lights. Install grab bars by the toilet and in the tub and shower. Do not use towel bars as grab bars. Use non-skid mats or decals in the tub or shower. If you need to sit down in the shower, use a plastic, non-slip stool. Keep the floor dry. Clean up any water that spills on the floor as soon as it  happens. Remove soap buildup in the tub or shower regularly. Attach bath mats securely with double-sided non-slip rug tape. Do not have throw rugs and other things on the floor that can make you trip. What can I do in the bedroom? Use night lights. Make sure that you have a light by your bed that is easy to reach. Do not use any sheets or blankets that are too big for your bed. They should not hang down onto the floor. Have a firm chair that has side arms. You can use this for support while you get dressed. Do not have throw rugs and other things on the floor that can make you trip. What can I do in the kitchen? Clean up any spills right away. Avoid walking on wet floors. Keep items that you use a lot in easy-to-reach places. If you need to reach something above you, use a strong step stool that has a grab bar. Keep electrical cords out of the way. Do not use floor polish or wax that makes floors slippery. If you must use wax, use non-skid floor wax. Do not have throw rugs and other things on the floor that can make you trip. What can I do with my stairs? Do not leave any items on the stairs. Make sure that there are handrails on both sides of the stairs and use them. Fix handrails that are broken or loose. Make sure that handrails are as long as the stairways. Check any carpeting to make sure that it is firmly attached to the stairs. Fix any carpet that is loose or worn. Avoid having throw rugs at the top or bottom of the stairs. If you do have throw rugs, attach them to the floor with carpet tape. Make sure that you have a light switch at the top of the stairs and the bottom of the stairs. If you do not have them, ask someone to add them for you. What else can I do to help prevent falls? Wear shoes that: Do not have high heels. Have rubber bottoms. Are comfortable and fit you well. Are closed at the toe. Do not wear sandals. If you use a stepladder: Make sure that it is fully opened.  Do not climb a closed stepladder. Make sure that both sides of the stepladder are locked into place. Ask someone to hold it for you, if possible. Clearly mark and make sure that you can see: Any grab bars or handrails. First and last steps. Where the edge of each step is. Use tools that help you move around (mobility aids) if they are needed. These include: Canes. Walkers. Scooters. Crutches. Turn on the lights when you go into a dark area. Replace any light bulbs as soon as  they burn out. Set up your furniture so you have a clear path. Avoid moving your furniture around. If any of your floors are uneven, fix them. If there are any pets around you, be aware of where they are. Review your medicines with your doctor. Some medicines can make you feel dizzy. This can increase your chance of falling. Ask your doctor what other things that you can do to help prevent falls. This information is not intended to replace advice given to you by your health care provider. Make sure you discuss any questions you have with your health care provider. Document Released: 07/29/2009 Document Revised: 03/09/2016 Document Reviewed: 11/06/2014 Elsevier Interactive Patient Education  2017 [employer].

## 2023-01-16 NOTE — Progress Notes (Signed)
Subjective:   Kathryn Young is a 77 y.o. female who presents for Medicare Annual (Subsequent) preventive examination. I connected with  [name] on 01/16/23 by a audio enabled telemedicine application and verified that I am speaking with the correct person using two identifiers.  Patient Location: Home  Provider Location: Home Office  I discussed the limitations of evaluation and management by telemedicine. The patient expressed understanding and agreed to proceed.  Review of Systems     Cardiac Risk Factors include: advanced age (>57men, >4 women);hypertension;dyslipidemia     Objective:    Today's Vitals   01/16/23 0921  Weight: 140 lb (63.5 kg)  Height: 5\' 3"  (1.6 m)   Body mass index is 24.8 kg/m.     01/16/2023    9:24 AM 01/12/2022   10:05 AM 01/10/2021    9:58 AM 01/05/2020    8:37 AM 06/03/2019    2:18 PM 12/19/2017   11:52 AM 03/27/2016    8:17 AM  Advanced Directives  Does Patient Have a Medical Advance Directive? Yes Yes Yes Yes Yes Yes Yes  Type of Paramedic of [place];Living will [place];Living will Living will Living will Living will Living will [place]  Does patient want to make changes to medical advance directive?   No - Patient declined  No - Patient declined  No - Patient declined  Copy of [place] in Chart? No - copy requested No - copy requested       Would patient like information on creating a medical advance directive?   No - Patient declined        Current Medications (verified) Outpatient Encounter Medications as of 01/16/2023  Medication Sig   acetaminophen (TYLENOL) 500 MG tablet Take 500 mg by mouth every 6 (six) hours as needed.   amLODipine (NORVASC) 5 MG tablet Take 0.5 tablets (2.5 mg total) by mouth daily.   calcium citrate-vitamin D (CITRACAL+D) 315-200 MG-UNIT tablet Take 1 tablet by mouth 2 (two) times daily.   DULoxetine (CYMBALTA) 30 MG capsule  Take 1 capsule (30 mg total) by mouth daily.   gabapentin (NEURONTIN) 300 MG capsule TAKE (1) OR (2) CAPSULES at bedtime   Olopatadine HCl (PATADAY OP) Apply to eye.   pantoprazole (PROTONIX) 40 MG tablet TAKE (1) TABLET DAILY FOR STOMACH   Propylene Glycol (SYSTANE COMPLETE OP) Apply to eye.   raloxifene (EVISTA) 60 MG tablet Take 1 tablet (60 mg total) by mouth daily.   simvastatin (ZOCOR) 20 MG tablet Take 1 tablet (20 mg total) by mouth daily.   Vitamin D, Ergocalciferol, (DRISDOL) 1.25 MG (50000 UNIT) CAPS capsule Take 1 capsule (50,000 Units total) by mouth every 7 (seven) days.   Zinc 50 MG TABS Take by mouth.   No facility-administered encounter medications on file as of 01/16/2023.    Allergies (verified) Patient has no known allergies.   History: Past Medical History:  Diagnosis Date   Brain infection    Age 16   Breast mass, right    Hyperlipidemia    Hypertension    no meds   Meningitis    Age 38   Neuromuscular disorder    right shoulder with burning down arm   SUNCT (short unilateral neuralgiform headache, conjunctival inj/tear) 02/16/2017   Past Surgical History:  Procedure Laterality Date   ABDOMINAL HYSTERECTOMY  1990   BREAST BIOPSY Right 02/02/2016   BREAST EXCISIONAL BIOPSY Right 03/27/2016   BREAST  EXCISIONAL BIOPSY Left    years ago   BREAST LUMPECTOMY Right 2017   BREAST LUMPECTOMY Left    pt states she is not sure of when or where believes it was in  [place].   BREAST LUMPECTOMY WITH RADIOACTIVE SEED LOCALIZATION Right 03/27/2016   Procedure: RIGHT BREAST [place];  Surgeon: Jackolyn Confer, MD;  Location: [facility];  Service: General;  Laterality: Right;  RIGHT BREAST LUMPECTOMYAFTER RADIOACTIVE SEED LOCALIZATION   BREAST SURGERY Left 1981   benign cysts removed   CATARACT EXTRACTION Bilateral 2021   Kidney Stones     MOHS SURGERY     nose, left thigh, back   Family History  Problem  Relation Age of Onset   Cancer Mother        Breast   Breast cancer Mother    Other Father        Drowned   Prostate cancer Paternal Grandfather    Heart attack Paternal Grandfather    Pneumonia Brother    Hypertension Son    Hyperlipidemia Son    Social History   Socioeconomic History   Marital status: Married    Spouse name: [name]   Number of children: 1   Years of education: 12   Highest education level: High school graduate  Occupational History   Occupation: Retired    Comment: Charity fundraiser  Tobacco Use   Smoking status: Never    Passive exposure: Never   Smokeless tobacco: Never  Vaping Use   Vaping Use: Never used  Substance and Sexual Activity   Alcohol use: No   Drug use: No   Sexual activity: Yes    Partners: Male    Birth control/protection: Surgical    Comment: Married  Other Topics Concern   Not on file  Social History Narrative   Lives at home w/ her husband and grandson   Right-handed   Caffeine: 2 cups of coffee and tea throughout the day   Son lives in her basement apartment   [place] grandson lives in cabin next door.   Social Determinants of Health   Financial Resource Strain: Low Risk  (01/16/2023)   Overall Financial Resource Strain (CARDIA)    Difficulty of Paying Living Expenses: Not hard at all  Food Insecurity: No Food Insecurity (01/16/2023)   Hunger Vital Sign    Worried About Running Out of Food in the Last Year: Never true    Ran Out of Food in the Last Year: Never true  Transportation Needs: No Transportation Needs (01/16/2023)   PRAPARE - Hydrologist (Medical): No    Lack of Transportation (Non-Medical): No  Physical Activity: Sufficiently Active (01/16/2023)   Exercise Vital Sign    Days of Exercise per Week: 3 days    Minutes of Exercise per Session: 60 min  Stress: No Stress Concern Present (01/16/2023)   [place]    Feeling of Stress : Not  at all  Social Connections: [place] (01/16/2023)   Social Connection and Isolation Panel [NHANES]    Frequency of Communication with Friends and Family: More than three times a week    Frequency of Social Gatherings with Friends and Family: More than three times a week    Attends Religious Services: More than 4 times per year    Active Member of [employer] or Organizations: Yes    Attends Archivist Meetings: More than 4 times per  year    Marital Status: Married    Tobacco Counseling Counseling given: Not Answered   Clinical Intake:  Pre-visit preparation completed: Yes  Pain : No/denies pain     Nutritional Risks: None Diabetes: No  How often do you need to have someone help you when you read instructions, pamphlets, or other written materials from your doctor or pharmacy?: 1 - Never  Diabetic?no   Interpreter Needed?: No  Information entered by :: Jadene Pierini, LPN   Activities of Daily Living    01/16/2023    9:25 AM  In your present state of health, do you have any difficulty performing the following activities:  Hearing? 0  Vision? 0  Difficulty concentrating or making decisions? 0  Walking or climbing stairs? 0  Dressing or bathing? 0  Doing errands, shopping? 0  Preparing Food and eating ? N  Using the Toilet? N  In the past six months, have you accidently leaked urine? N  Do you have problems with loss of bowel control? N  Managing your Medications? N  Managing your Finances? N  Housekeeping or managing your Housekeeping? N    Patient Care Team: Claretta Fraise, MD as PCP - General (Family Medicine) Harl Bowie, Alphonse Guild, MD as PCP - Cardiology (Cardiology) Warden Fillers, MD as Consulting Physician (Ophthalmology) Rolm Bookbinder, MD as Consulting Physician (Dermatology) Steffanie Rainwater, DPM as Consulting Physician (Podiatry)  Indicate any recent Medical Services you may have received from other than [facility] providers in the past year (date  may be approximate).     Assessment:   This is a routine wellness examination for Miranda.  Hearing/Vision screen Vision Screening - Comments:: Wears rx glasses - up to date with routine eye exams with  Dr.Groat   Dietary issues and exercise activities discussed: Current Exercise Habits: Home exercise routine, Type of exercise: strength training/weights, Time (Minutes): 30, Frequency (Times/Week): 3, Weekly Exercise (Minutes/Week): 90, Intensity: Mild, Exercise limited by: None identified   Goals Addressed             This Visit's Progress    DIET - INCREASE WATER INTAKE   On track    Try to drink 6-8 glasses of water daily.     Exercise 3x per week (30 min per time)   On track    Resume walking 3 times per week for 30 minutes       Depression Screen    01/16/2023    9:23 AM 12/25/2022    7:59 AM 06/26/2022    7:59 AM 01/12/2022    9:58 AM 12/20/2021    7:55 AM 09/01/2021    9:02 AM 06/22/2021    7:55 AM  PHQ 2/9 Scores  PHQ - 2 Score 0 0 0 0 0 0 0  PHQ- 9 Score      0     Fall Risk    01/16/2023    9:22 AM 12/25/2022    7:59 AM 06/26/2022    7:59 AM 01/12/2022    9:53 AM 12/20/2021    7:55 AM  Fall Risk   Falls in the past year? 0 0 0 0 0  Number falls in past yr: 0   0   Injury with Fall? 0   0   Risk for fall due to : No Fall Risks   No Fall Risks   Follow up Falls prevention discussed   Falls prevention discussed     FALL RISK PREVENTION PERTAINING TO THE HOME:  Any stairs in  or around the home? Yes  If so, are there any without handrails? No  Home free of loose throw rugs in walkways, pet beds, electrical cords, etc? Yes  Adequate lighting in your home to reduce risk of falls? Yes   ASSISTIVE DEVICES UTILIZED TO PREVENT FALLS:  Life alert? No  Use of a cane, walker or w/c? No  Grab bars in the bathroom? No  Shower chair or bench in shower? Yes  Elevated toilet seat or a handicapped toilet? No       12/19/2017   11:51 AM  MMSE - Mini Mental State Exam   Orientation to time 5  Orientation to Place 5  Registration 3  Attention/ Calculation 4  Recall 3  Language- name 2 objects 2  Language- repeat 1  Language- follow 3 step command 3  Language- read & follow direction 1  Write a sentence 1  Copy design 1  Total score 29        01/16/2023    9:25 AM 01/12/2022   10:26 AM 01/10/2021    9:59 AM 01/05/2020    8:40 AM 06/03/2019    2:22 PM  6CIT Screen  What Year? 0 points 0 points 0 points 0 points 0 points  What month? 0 points 0 points 0 points 0 points 0 points  What time? 0 points 0 points 0 points 0 points 0 points  Count back from 20 0 points 0 points 0 points 0 points 0 points  Months in reverse 0 points 0 points 0 points 0 points 0 points  Repeat phrase 0 points 0 points 0 points 0 points 0 points  Total Score 0 points 0 points 0 points 0 points 0 points    Immunizations Immunization History  Administered Date(s) Administered   Fluad Quad(high Dose 65+) 07/01/2019, 07/16/2020   Influenza, High Dose Seasonal PF 07/31/2016, 07/28/2017   Influenza,inj,Quad PF,6-35 Mos 07/02/2019   Influenza-Unspecified 07/25/2014, 07/25/2015, 07/29/2018   Moderna Sars-Covid-2 Vaccination 12/30/2019, 01/27/2020, 11/02/2020   Pneumococcal Conjugate-13 02/18/2016   Pneumococcal Polysaccharide-23 09/16/2011   Tdap 04/06/2010   Zoster Recombinat (Shingrix) 06/29/2021, 09/15/2021   Zoster, Live 11/21/2007    TDAP status: Due, Education has been provided regarding the importance of this vaccine. Advised may receive this vaccine at local pharmacy or Health Dept. Aware to provide a copy of the vaccination record if obtained from local pharmacy or Health Dept. Verbalized acceptance and understanding.  Flu Vaccine status: Up to date  Pneumococcal vaccine status: Up to date  Covid-19 vaccine status: Completed vaccines  Qualifies for Shingles Vaccine? Yes   Zostavax completed Yes   Shingrix Completed?: Yes  Screening Tests Health Maintenance   Topic Date Due   DTaP/Tdap/Td (2 - Td or Tdap) 04/06/2020   COVID-19 Vaccine (4 - 2023-24 season) 06/16/2022   MAMMOGRAM  01/14/2023   INFLUENZA VACCINE  05/17/2023   DEXA SCAN  12/21/2023   Medicare Annual Wellness (AWV)  01/16/2024   Pneumonia Vaccine 57+ Years old  Completed   Hepatitis C Screening  Completed   Zoster Vaccines- Shingrix  Completed   HPV VACCINES  Aged Out   COLONOSCOPY (Pts 45-50yrs Insurance coverage will need to be confirmed)  Discontinued    Health Maintenance  Health Maintenance Due  Topic Date Due   DTaP/Tdap/Td (2 - Td or Tdap) 04/06/2020   COVID-19 Vaccine (4 - 2023-24 season) 06/16/2022   MAMMOGRAM  01/14/2023    Colorectal cancer screening: No longer required.   Mammogram status: No longer  required due to age.  Bone Density status: Completed 12/20/2021. Results reflect: Bone density results: OSTEOPOROSIS. Repeat every 2 years.  Lung Cancer Screening: (Low Dose CT Chest recommended if Age 52-80 years, 30 pack-year currently smoking OR have quit w/in 15years.) does not qualify.   Lung Cancer Screening Referral: n/a  Additional Screening:  Hepatitis C Screening: does not qualify; Completed 02/18/2016  Vision Screening: Recommended annual ophthalmology exams for early detection of glaucoma and other disorders of the eye. Is the patient up to date with their annual eye exam?  Yes  Who is the provider or what is the name of the office in which the patient attends annual eye exams? Dr.Groat  If pt is not established with a provider, would they like to be referred to a provider to establish care? No .   Dental Screening: Recommended annual dental exams for proper oral hygiene  Community Resource Referral / Chronic Care Management: CRR required this visit?  No   CCM required this visit?  No      Plan:     I have personally reviewed and noted the following in the patient's chart:   Medical and social history Use of alcohol, tobacco or  illicit drugs  Current medications and supplements including opioid prescriptions. Patient is not currently taking opioid prescriptions. Functional ability and status Nutritional status Physical activity Advanced directives List of other physicians Hospitalizations, surgeries, and ER visits in previous 12 months Vitals Screenings to include cognitive, depression, and falls Referrals and appointments  In addition, I have reviewed and discussed with patient certain preventive protocols, quality metrics, and best practice recommendations. A written personalized care plan for preventive services as well as general preventive health recommendations were provided to patient.     Daphane Shepherd, LPN   [id number]   Nurse Notes: Due TDAP Vaccine

## 2023-01-30 DIAGNOSIS — L820 Inflamed seborrheic keratosis: Secondary | ICD-10-CM | POA: Diagnosis not present

## 2023-01-30 DIAGNOSIS — L720 Epidermal cyst: Secondary | ICD-10-CM | POA: Diagnosis not present

## 2023-01-30 DIAGNOSIS — L570 Actinic keratosis: Secondary | ICD-10-CM | POA: Diagnosis not present

## 2023-01-30 DIAGNOSIS — Z85828 Personal history of other malignant neoplasm of skin: Secondary | ICD-10-CM | POA: Diagnosis not present

## 2023-01-30 DIAGNOSIS — L821 Other seborrheic keratosis: Secondary | ICD-10-CM | POA: Diagnosis not present

## 2023-01-30 DIAGNOSIS — L578 Other skin changes due to chronic exposure to nonionizing radiation: Secondary | ICD-10-CM | POA: Diagnosis not present

## 2023-01-30 DIAGNOSIS — D1801 Hemangioma of skin and subcutaneous tissue: Secondary | ICD-10-CM | POA: Diagnosis not present

## 2023-02-05 ENCOUNTER — Other Ambulatory Visit: Payer: Self-pay | Admitting: Family Medicine

## 2023-02-05 DIAGNOSIS — Z1231 Encounter for screening mammogram for malignant neoplasm of breast: Secondary | ICD-10-CM

## 2023-03-06 ENCOUNTER — Ambulatory Visit: Payer: Medicare HMO | Attending: Cardiology | Admitting: Cardiology

## 2023-03-06 VITALS — BP 148/78 | HR 71 | Ht 63.0 in | Wt 140.2 lb

## 2023-03-06 DIAGNOSIS — I340 Nonrheumatic mitral (valve) insufficiency: Secondary | ICD-10-CM | POA: Diagnosis not present

## 2023-03-06 DIAGNOSIS — I10 Essential (primary) hypertension: Secondary | ICD-10-CM | POA: Diagnosis not present

## 2023-03-06 DIAGNOSIS — E782 Mixed hyperlipidemia: Secondary | ICD-10-CM

## 2023-03-06 MED ORDER — ATORVASTATIN CALCIUM 40 MG PO TABS: 40.0000 mg | ORAL_TABLET | Freq: Every day | ORAL | 6 refills | Status: DC

## 2023-03-06 NOTE — Progress Notes (Signed)
Clinical Summary Kathryn Young is a 77 y.o.female seen today for follow up of the following medical problems.    1. Mitral regurgitation - reports told 25 years ago had heart murmur     02/2018 echo: LVEF 55-60%, grade I diastolic dysfunction, moderate MR, bilateral MV leaflet prolapse   08/2019 echo: LVEF 60-65%, indet DDx, mild to mod MR - no recent symptoms   03/2022 echo: LVEF 65-70%, no WMAs, normal diastolic fxn, normal RV, at least moderate with bileaflet prolapse - no SOB/DOE, no LE edema. Exercising twice weekly with senior classes   2.HTN - home bp's usually 120s/70s    3.Hyperlipidemia 06/2022 TC 173 TG 114 HDL 57 LDL 96 - 12/2022 TC 161 TG 096 HDL 58 LDL 116 - has been on simva 49m long time.      Past Medical History:  Diagnosis Date   Brain infection    Age 37   Breast mass, right    Hyperlipidemia    Hypertension    no meds   Meningitis    Age 61   Neuromuscular disorder (HCC)    right shoulder with burning down arm   SUNCT (short unilateral neuralgiform headache, conjunctival inj/tear) 02/16/2017     No Known Allergies   Current Outpatient Medications  Medication Sig Dispense Refill   acetaminophen (TYLENOL) 500 MG tablet Take 500 mg by mouth every 6 (six) hours as needed.     amLODipine (NORVASC) 5 MG tablet Take 0.5 tablets (2.5 mg total) by mouth daily. 45 tablet 3   calcium citrate-vitamin D (CITRACAL+D) 315-200 MG-UNIT tablet Take 1 tablet by mouth 2 (two) times daily.     DULoxetine (CYMBALTA) 30 MG capsule Take 1 capsule (30 mg total) by mouth daily. 90 capsule 3   gabapentin (NEURONTIN) 300 MG capsule TAKE (1) OR (2) CAPSULES at bedtime 180 capsule 3   Olopatadine HCl (PATADAY OP) Apply to eye.     pantoprazole (PROTONIX) 40 MG tablet TAKE (1) TABLET DAILY FOR STOMACH 90 tablet 3   Propylene Glycol (SYSTANE COMPLETE OP) Apply to eye.     raloxifene (EVISTA) 60 MG tablet Take 1 tablet (60 mg total) by mouth daily. 90 tablet 3    simvastatin (ZOCOR) 20 MG tablet Take 1 tablet (20 mg total) by mouth daily. 90 tablet 3   Vitamin D, Ergocalciferol, (DRISDOL) 1.25 MG (50000 UNIT) CAPS capsule Take 1 capsule (50,000 Units total) by mouth every 7 (seven) days. 13 capsule 3   Zinc 50 MG TABS Take by mouth.     No current facility-administered medications for this visit.     Past Surgical History:  Procedure Laterality Date   ABDOMINAL HYSTERECTOMY  1990   BREAST BIOPSY Right 02/02/2016   BREAST EXCISIONAL BIOPSY Right 03/27/2016   BREAST EXCISIONAL BIOPSY Left    years ago   BREAST LUMPECTOMY Right 2017   BREAST LUMPECTOMY Left    pt states she is not sure of when or where believes it was in [place].   BREAST LUMPECTOMY WITH RADIOACTIVE SEED LOCALIZATION Right 03/27/2016   Procedure: RIGHT BREAST LUMPECTOMYAFTER RADIOACTIVE SEED LOCALIZATION;  Surgeon: Avel Peace, MD;  Location: [facility];  Service: General;  Laterality: Right;  RIGHT BREAST LUMPECTOMYAFTER RADIOACTIVE SEED LOCALIZATION   BREAST SURGERY Left 1981   benign cysts removed   CATARACT EXTRACTION Bilateral 2021   Kidney Stones     MOHS SURGERY     nose, left thigh, back  No Known Allergies    Family History  Problem Relation Age of Onset   Cancer Mother        Breast   Breast cancer Mother    Other Father        Drowned   Prostate cancer Paternal Grandfather    Heart attack Paternal Grandfather    Pneumonia Brother    Hypertension Son    Hyperlipidemia Son      Social History Kathryn Young reports that she has never smoked. She has never been exposed to tobacco smoke. She has never used smokeless tobacco. Kathryn Young reports no history of alcohol use.   Review of Systems CONSTITUTIONAL: No weight loss, fever, chills, weakness or fatigue.  HEENT: Eyes: No visual loss, blurred vision, double vision or yellow sclerae.No hearing loss, sneezing, congestion, runny nose or sore throat.  SKIN: No rash or itching.   CARDIOVASCULAR: per hpi RESPIRATORY: No shortness of breath, cough or sputum.  GASTROINTESTINAL: No anorexia, nausea, vomiting or diarrhea. No abdominal pain or blood.  GENITOURINARY: No burning on urination, no polyuria NEUROLOGICAL: No headache, dizziness, syncope, paralysis, ataxia, numbness or tingling in the extremities. No change in bowel or bladder control.  MUSCULOSKELETAL: No muscle, back pain, joint pain or stiffness.  LYMPHATICS: No enlarged nodes. No history of splenectomy.  PSYCHIATRIC: No history of depression or anxiety.  ENDOCRINOLOGIC: No reports of sweating, cold or heat intolerance. No polyuria or polydipsia.  Marland Kitchen   Physical Examination Today's Vitals   03/06/23 1109  BP: (!) 158/86  Pulse: 71  SpO2: 99%  Weight: 140 lb 3.2 oz (63.6 kg)  Height: 5\' 3"  (1.6 m)   Body mass index is 24.84 kg/m.  Gen: resting comfortably, no acute distress HEENT: no scleral icterus, pupils equal round and reactive, no palptable cervical adenopathy,  CV: RRR, no m/rg no jvd Resp: Clear to auscultation bilaterally GI: abdomen is soft, non-tender, non-distended, normal bowel sounds, no hepatosplenomegaly MSK: extremities are warm, no edema.  Skin: warm, no rash Neuro:  no focal deficits Psych: appropriate affect   Diagnostic Studies  02/2018 echo Study Conclusions   - Left ventricle: The cavity size was normal. Wall thickness was   increased in a pattern of mild LVH. Systolic function was normal.   The estimated ejection fraction was in the range of 55% to 60%.   Wall motion was normal; there were no regional wall motion   abnormalities. Doppler parameters are consistent with abnormal   left ventricular relaxation (grade 1 diastolic dysfunction).   Doppler parameters are consistent with indeterminate ventricular   filling pressure. - Mitral valve: Mild bileaflet prolapse. Mildly thickened leaflets   . There was moderate regurgitation. - Left atrium: The atrium was severely  dilated. - Tricuspid valve: There was mild regurgitation.     08/2019 echo IMPRESSIONS     1. Left ventricular ejection fraction, by visual estimation, is 60 to  65%. The left ventricle has normal function. There is mildly increased  left ventricular hypertrophy.   2. Left ventricular diastolic parameters are indeterminate.   3. Global right ventricle has normal systolic function.The right  ventricular size is normal. No increase in right ventricular wall  thickness.   4. Left atrial size was mildly dilated.   5. Right atrial size was normal.   6. The mitral valve is abnormal. Mild to moderate mitral valve  regurgitation. No evidence of mitral stenosis.   7. 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 The MR jet is eccentric  and posterior, which may lead to underestimation. The MV/AV TVI ratio is 1  suggestiong mild to moderate MR.   8. The tricuspid valve is normal in structure. Tricuspid valve  regurgitation is mild.   9. The aortic valve is tricuspid. Aortic valve regurgitation is not  visualized. No evidence of aortic valve sclerosis or stenosis.  10. The pulmonic valve was not well visualized. Pulmonic valve  regurgitation is not visualized.  11. Normal pulmonary artery systolic pressure.  12. The inferior vena cava is normal in size with greater than 50%  respiratory variability, suggesting right atrial pressure of 3 mmHg.  13. 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      03/2022 echo IMPRESSIONS     1. Left ventricular ejection fraction, by estimation, is 65 to 70%. The  left ventricle has normal function. The left ventricle has no regional  wall motion abnormalities. Left ventricular diastolic parameters were  normal.   2. Right ventricular systolic function is normal. The right ventricular  size is normal. There is normal pulmonary artery systolic pressure.   3. Left atrial size was moderately dilated.   4. 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  Multiple jets of MR, anterior  and posteriorly directed. Difficult to quantitate, appears at least  moderate in severity. Consider TEE to further define mechanism and  severity of MR. The mitral valve is  myxomatous.   5. The aortic valve is tricuspid. Aortic valve regurgitation is not  visualized.   6. The inferior vena cava is normal in size with greater than 50%  respiratory variability, suggesting right atrial pressure of 3 mmHg.      Assessment and Plan   1. Mitral regurgitation - moderate by prior echo, we will continue surveillance imaging with a repeat echo No symptoms.    2. HTN - elevated here but home numbers at goal, continue current meds   3. Hyperlipidemia - above LDL goal of <100, d/c simvastatin and start atorvastatin 40mg  daily  EKG today shows NSR, rare PVCs asymptomatic.  F/u 6 months  Antoine Poche, M.D

## 2023-03-06 NOTE — Patient Instructions (Signed)
Medication Instructions:   Stop Simvastatin (Zocor) Begin Atorvastatin 40mg  daily  Continue all other medications.     Labwork:  none  Testing/Procedures:  Your physician has requested that you have an echocardiogram. Echocardiography is a painless test that uses sound waves to create images of your heart. It provides your doctor with information about the size and shape of your heart and how well your heart's chambers and valves are working. This procedure takes approximately one hour. There are no restrictions for this procedure. Please do NOT wear cologne, perfume, aftershave, or lotions (deodorant is allowed). Please arrive 15 minutes prior to your appointment time. Office will contact with results via phone, letter or mychart.     Follow-Up:  6 months   Any Other Special Instructions Will Be Listed Below (If Applicable).   If you need a refill on your cardiac medications before your next appointment, please call your pharmacy.

## 2023-03-16 ENCOUNTER — Ambulatory Visit
Admission: RE | Admit: 2023-03-16 | Discharge: 2023-03-16 | Disposition: A | Discharge status: Home / Self Care | Payer: Medicare HMO | Source: Ambulatory Visit | Attending: Family Medicine | Admitting: Family Medicine

## 2023-03-16 DIAGNOSIS — Z1231 Encounter for screening mammogram for malignant neoplasm of breast: Secondary | ICD-10-CM

## 2023-04-24 ENCOUNTER — Ambulatory Visit (HOSPITAL_COMMUNITY)
Admission: RE | Admit: 2023-04-24 | Discharge: 2023-04-24 | Disposition: A | Discharge status: Home / Self Care | Payer: Medicare HMO | Source: Ambulatory Visit | Attending: Cardiology | Admitting: Cardiology

## 2023-04-24 DIAGNOSIS — I340 Nonrheumatic mitral (valve) insufficiency: Secondary | ICD-10-CM | POA: Insufficient documentation

## 2023-04-24 LAB — ECHOCARDIOGRAM COMPLETE
Area-P 1/2: 3.12 cm2
MV M vel: 5.5 m/s
MV Peak grad: 121 mmHg
Radius: 0.3 cm
S' Lateral: 3 cm

## 2023-04-24 NOTE — Progress Notes (Signed)
*  PRELIMINARY RESULTS* Echocardiogram 2D Echocardiogram has been performed.  Kathryn Young 04/24/2023, 11:31 AM

## 2023-05-07 DIAGNOSIS — Z85828 Personal history of other malignant neoplasm of skin: Secondary | ICD-10-CM | POA: Diagnosis not present

## 2023-05-07 DIAGNOSIS — L820 Inflamed seborrheic keratosis: Secondary | ICD-10-CM | POA: Diagnosis not present

## 2023-05-07 DIAGNOSIS — C44319 Basal cell carcinoma of skin of other parts of face: Secondary | ICD-10-CM | POA: Diagnosis not present

## 2023-05-07 DIAGNOSIS — L570 Actinic keratosis: Secondary | ICD-10-CM | POA: Diagnosis not present

## 2023-05-07 DIAGNOSIS — C44311 Basal cell carcinoma of skin of nose: Secondary | ICD-10-CM | POA: Diagnosis not present

## 2023-05-08 ENCOUNTER — Telehealth: Payer: Self-pay | Admitting: *Deleted

## 2023-05-08 NOTE — Telephone Encounter (Signed)
-----   Message from Dina Rich sent at 05/07/2023  3:37 PM EDT ----- Echo shows mitral valve remains moderately leaky, we will continue to monitor  Dominga Ferry MD

## 2023-05-08 NOTE — Telephone Encounter (Signed)
  Lesle Chris, LPN 2/95/2841  3:24 PM EDT Back to Top    Notified, copy to pcp.

## 2023-05-22 DIAGNOSIS — Z85828 Personal history of other malignant neoplasm of skin: Secondary | ICD-10-CM | POA: Diagnosis not present

## 2023-05-22 DIAGNOSIS — C44311 Basal cell carcinoma of skin of nose: Secondary | ICD-10-CM | POA: Diagnosis not present

## 2023-06-27 ENCOUNTER — Encounter: Payer: Self-pay | Admitting: Family Medicine

## 2023-06-27 ENCOUNTER — Ambulatory Visit (INDEPENDENT_AMBULATORY_CARE_PROVIDER_SITE_OTHER): Payer: Medicare HMO | Admitting: Family Medicine

## 2023-06-27 VITALS — BP 139/75 | HR 74 | Temp 97.1°F | Ht 63.0 in | Wt 140.0 lb

## 2023-06-27 DIAGNOSIS — I10 Essential (primary) hypertension: Secondary | ICD-10-CM

## 2023-06-27 DIAGNOSIS — K219 Gastro-esophageal reflux disease without esophagitis: Secondary | ICD-10-CM

## 2023-06-27 DIAGNOSIS — E559 Vitamin D deficiency, unspecified: Secondary | ICD-10-CM | POA: Diagnosis not present

## 2023-06-27 DIAGNOSIS — M8589 Other specified disorders of bone density and structure, multiple sites: Secondary | ICD-10-CM | POA: Diagnosis not present

## 2023-06-27 DIAGNOSIS — I340 Nonrheumatic mitral (valve) insufficiency: Secondary | ICD-10-CM

## 2023-06-27 DIAGNOSIS — G500 Trigeminal neuralgia: Secondary | ICD-10-CM | POA: Diagnosis not present

## 2023-06-27 DIAGNOSIS — R202 Paresthesia of skin: Secondary | ICD-10-CM

## 2023-06-27 DIAGNOSIS — Z23 Encounter for immunization: Secondary | ICD-10-CM | POA: Diagnosis not present

## 2023-06-27 DIAGNOSIS — E782 Mixed hyperlipidemia: Secondary | ICD-10-CM | POA: Diagnosis not present

## 2023-06-27 MED ORDER — VITAMIN D (ERGOCALCIFEROL) 1.25 MG (50000 UNIT) PO CAPS
50000.0000 [IU] | ORAL_CAPSULE | ORAL | 3 refills | Status: DC
Start: 2023-06-27 — End: 2024-08-14

## 2023-06-27 MED ORDER — GABAPENTIN 300 MG PO CAPS
ORAL_CAPSULE | ORAL | 3 refills | Status: DC
Start: 2023-06-27 — End: 2023-12-25

## 2023-06-27 MED ORDER — RALOXIFENE HCL 60 MG PO TABS: 60.0000 mg | ORAL_TABLET | Freq: Every day | ORAL | 3 refills | Status: DC

## 2023-06-27 MED ORDER — PANTOPRAZOLE SODIUM 40 MG PO TBEC
DELAYED_RELEASE_TABLET | ORAL | 3 refills | Status: DC
Start: 2023-06-27 — End: 2023-12-25

## 2023-06-27 MED ORDER — DULOXETINE HCL 30 MG PO CPEP
30.0000 mg | ORAL_CAPSULE | Freq: Every day | ORAL | 3 refills | Status: DC
Start: 2023-06-27 — End: 2023-12-25

## 2023-06-27 NOTE — Progress Notes (Signed)
Subjective:  Patient ID: Kathryn Young, female    DOB: October 17, 1945  Age: 77 y.o. MRN: [id number]  CC: Medical Management of Chronic Issues   HPI Kathryn Young presents for  follow-up of hypertension. Patient has no history of headache chest pain or shortness of breath or recent cough. Patient also denies symptoms of TIA such as focal numbness or weakness. Patient denies side effects from medication. States taking it regularly.   in for follow-up of elevated cholesterol. Doing well without complaints on current medication. Denies side effects of statin including myalgia and arthralgia and nausea. Currently no chest pain, shortness of breath or other cardiovascular related symptoms noted.   Patient in for follow-up of GERD. Currently asymptomatic taking  PPI daily. There is no chest pain or heartburn. No hematemesis and no melena. No dysphagia or choking. Onset is remote. Progression is stable. Complicating factors, none.  Trigrminal Neuralgia - doing good on gabapentin      06/27/2023    7:47 AM 01/16/2023    9:23 AM 12/25/2022    7:59 AM 06/26/2022    7:59 AM 01/12/2022    9:58 AM  Depression screen PHQ 2/9  Decreased Interest 0 0 0 0 0  Down, Depressed, Hopeless 0 0 0 0 0  PHQ - 2 Score 0 0 0 0 0      History Kathryn Young has a past medical history of Brain infection, Breast mass, right, Hyperlipidemia, Hypertension, Meningitis, Neuromuscular disorder (HCC), and SUNCT (short unilateral neuralgiform headache, conjunctival inj/tear) (02/16/2017).   Kathryn Young has a past surgical history that includes Abdominal hysterectomy (1990); Kidney Stones; Breast surgery (Left, 1981); Breast lumpectomy with radioactive seed localization (Right, 03/27/2016); Breast lumpectomy (Right, 2017); Breast lumpectomy (Left); Cataract extraction (Bilateral, 2021); Breast biopsy (Right, 02/02/2016); Breast excisional biopsy (Right, 03/27/2016); Breast excisional biopsy (Left); and Mohs surgery.   Her family history  includes Breast cancer in her mother; Cancer in her mother; Heart attack in her paternal grandfather; Hyperlipidemia in her son; Hypertension in her son; Other in her father; Pneumonia in her brother; Prostate cancer in her paternal grandfather.Kathryn Young reports that Kathryn Young has never smoked. Kathryn Young has never been exposed to tobacco smoke. Kathryn Young has never used smokeless tobacco. Kathryn Young reports that Kathryn Young does not drink alcohol and does not use drugs.  Current Outpatient Medications on File Prior to Visit  Medication Sig Dispense Refill   acetaminophen (TYLENOL) 500 MG tablet Take 500 mg by mouth every 6 (six) hours as needed.     amLODipine (NORVASC) 5 MG tablet Take 0.5 tablets (2.5 mg total) by mouth daily. 45 tablet 3   atorvastatin (LIPITOR) 40 MG tablet Take 1 tablet (40 mg total) by mouth daily. 30 tablet 6   calcium citrate-vitamin D (CITRACAL+D) 315-200 MG-UNIT tablet Take 1 tablet by mouth 2 (two) times daily.     Olopatadine HCl (PATADAY OP) Apply to eye.     Propylene Glycol (SYSTANE COMPLETE OP) Apply to eye.     Zinc 50 MG TABS Take by mouth.     No current facility-administered medications on file prior to visit.    ROS Review of Systems  Constitutional: Negative.   HENT: Negative.    Eyes:  Negative for visual disturbance.  Respiratory:  Negative for shortness of breath.   Cardiovascular:  Negative for chest pain.  Gastrointestinal:  Negative for abdominal pain.  Musculoskeletal:  Negative for arthralgias.    Objective:  BP (!) 140/72   Pulse 74   Temp (!) 97.1 F (  36.2 C)   Ht 5\' 3"  (1.6 m)   Wt 140 lb (63.5 kg)   SpO2 95%   BMI 24.80 kg/m   BP Readings from Last 3 Encounters:  06/27/23 (!) 140/72  03/06/23 (!) 148/78  12/25/22 137/65    Wt Readings from Last 3 Encounters:  06/27/23 140 lb (63.5 kg)  03/06/23 140 lb 3.2 oz (63.6 kg)  01/16/23 140 lb (63.5 kg)     Physical Exam Constitutional:      General: Kathryn Young is not in acute distress.    Appearance: Kathryn Young is  well-developed.  Cardiovascular:     Rate and Rhythm: Normal rate and regular rhythm.     Heart sounds: Murmur (2-3/6) heard.  Pulmonary:     Breath sounds: Normal breath sounds.  Musculoskeletal:        General: Normal range of motion.  Skin:    General: Skin is warm and dry.  Neurological:     Mental Status: Kathryn Young is alert and oriented to person, place, and time.       Assessment & Plan:   Kathryn Young was seen today for medical management of chronic issues.  Diagnoses and all orders for this visit:  Mixed hyperlipidemia -     Lipid panel  Primary hypertension -     CBC with Differential/Platelet -     CMP14+EGFR  Vitamin D deficiency -     VITAMIN D 25 Hydroxy (Vit-D Deficiency, Fractures) -     Vitamin D, Ergocalciferol, (DRISDOL) 1.25 MG (50000 UNIT) CAPS capsule; Take 1 capsule (50,000 Units total) by mouth every 7 (seven) days.  Trigeminal neuralgia of right side of face -     DULoxetine (CYMBALTA) 30 MG capsule; Take 1 capsule (30 mg total) by mouth daily.  Notalgia paresthetica -     DULoxetine (CYMBALTA) 30 MG capsule; Take 1 capsule (30 mg total) by mouth daily. -     gabapentin (NEURONTIN) 300 MG capsule; TAKE (1) OR (2) CAPSULES at bedtime  Gastroesophageal reflux disease without esophagitis -     pantoprazole (PROTONIX) 40 MG tablet; TAKE (1) TABLET DAILY FOR STOMACH  Osteopenia of multiple sites -     raloxifene (EVISTA) 60 MG tablet; Take 1 tablet (60 mg total) by mouth daily.  Mitral valve insufficiency, unspecified etiology   Allergies as of 06/27/2023   No Known Allergies      Medication List        Accurate as of June 27, 2023  8:21 AM. If you have any questions, ask your nurse or doctor.          acetaminophen 500 MG tablet Commonly known as: TYLENOL Take 500 mg by mouth every 6 (six) hours as needed.   amLODipine 5 MG tablet Commonly known as: NORVASC Take 0.5 tablets (2.5 mg total) by mouth daily.   atorvastatin 40 MG  tablet Commonly known as: LIPITOR Take 1 tablet (40 mg total) by mouth daily.   calcium citrate-vitamin D 315-200 MG-UNIT tablet Commonly known as: CITRACAL+D Take 1 tablet by mouth 2 (two) times daily.   DULoxetine 30 MG capsule Commonly known as: CYMBALTA Take 1 capsule (30 mg total) by mouth daily.   gabapentin 300 MG capsule Commonly known as: NEURONTIN TAKE (1) OR (2) CAPSULES at bedtime   pantoprazole 40 MG tablet Commonly known as: PROTONIX TAKE (1) TABLET DAILY FOR STOMACH   PATADAY OP Apply to eye.   raloxifene 60 MG tablet Commonly known as: EVISTA Take 1 tablet (60 mg  total) by mouth daily.   SYSTANE COMPLETE OP Apply to eye.   Vitamin D (Ergocalciferol) 1.25 MG (50000 UNIT) Caps capsule Commonly known as: DRISDOL Take 1 capsule (50,000 Units total) by mouth every 7 (seven) days.   Zinc 50 MG Tabs Take by mouth.        Meds ordered this encounter  Medications   DULoxetine (CYMBALTA) 30 MG capsule    Sig: Take 1 capsule (30 mg total) by mouth daily.    Dispense:  90 capsule    Refill:  3   gabapentin (NEURONTIN) 300 MG capsule    Sig: TAKE (1) OR (2) CAPSULES at bedtime    Dispense:  180 capsule    Refill:  3   pantoprazole (PROTONIX) 40 MG tablet    Sig: TAKE (1) TABLET DAILY FOR STOMACH    Dispense:  90 tablet    Refill:  3   raloxifene (EVISTA) 60 MG tablet    Sig: Take 1 tablet (60 mg total) by mouth daily.    Dispense:  90 tablet    Refill:  3   Vitamin D, Ergocalciferol, (DRISDOL) 1.25 MG (50000 UNIT) CAPS capsule    Sig: Take 1 capsule (50,000 Units total) by mouth every 7 (seven) days.    Dispense:  13 capsule    Refill:  3     Mitral regurg, moderate to severe per Dr. Eden Emms reading of echo.   Follow-up: Return in about 6 months (around 12/25/2023).  Mechele Claude, M.D.

## 2023-06-28 LAB — CBC WITH DIFFERENTIAL/PLATELET
Basophils Absolute: 0 10*3/uL (ref 0.0–0.2)
Basos: 1 %
EOS (ABSOLUTE): 0.1 10*3/uL (ref 0.0–0.4)
Eos: 2 %
Hematocrit: 36.8 % (ref 34.0–46.6)
Hemoglobin: 12.8 g/dL (ref 11.1–15.9)
Immature Grans (Abs): 0 10*3/uL (ref 0.0–0.1)
Immature Granulocytes: 0 %
Lymphocytes Absolute: 2 10*3/uL (ref 0.7–3.1)
Lymphs: 41 %
MCH: 33.8 pg — ABNORMAL HIGH (ref 26.6–33.0)
MCHC: 34.8 g/dL (ref 31.5–35.7)
MCV: 97 fL (ref 79–97)
Monocytes Absolute: 0.4 10*3/uL (ref 0.1–0.9)
Monocytes: 8 %
Neutrophils Absolute: 2.4 10*3/uL (ref 1.4–7.0)
Neutrophils: 48 %
Platelets: 229 10*3/uL (ref 150–450)
RBC: 3.79 x10E6/uL (ref 3.77–5.28)
RDW: 11.9 % (ref 11.7–15.4)
WBC: 4.9 10*3/uL (ref 3.4–10.8)

## 2023-06-28 LAB — CMP14+EGFR
ALT: 11 IU/L (ref 0–32)
AST: 17 IU/L (ref 0–40)
Albumin: 4.4 g/dL (ref 3.8–4.8)
Alkaline Phosphatase: 105 IU/L (ref 44–121)
BUN/Creatinine Ratio: 18 (ref 12–28)
BUN: 15 mg/dL (ref 8–27)
Bilirubin Total: 0.5 mg/dL (ref 0.0–1.2)
CO2: 24 mmol/L (ref 20–29)
Calcium: 9.4 mg/dL (ref 8.7–10.3)
Chloride: 104 mmol/L (ref 96–106)
Creatinine, Ser: 0.84 mg/dL (ref 0.57–1.00)
Globulin, Total: 2 g/dL (ref 1.5–4.5)
Glucose: 112 mg/dL — ABNORMAL HIGH (ref 70–99)
Potassium: 4 mmol/L (ref 3.5–5.2)
Sodium: 141 mmol/L (ref 134–144)
Total Protein: 6.4 g/dL (ref 6.0–8.5)
eGFR: 72 mL/min/{1.73_m2} (ref >59)

## 2023-06-28 LAB — LIPID PANEL
Chol/HDL Ratio: 3.4 ratio (ref 0.0–4.4)
Cholesterol, Total: 188 mg/dL (ref 100–199)
HDL: 55 mg/dL (ref >39)
LDL Chol Calc (NIH): 115 mg/dL — ABNORMAL HIGH (ref 0–99)
Triglycerides: 100 mg/dL (ref 0–149)
VLDL Cholesterol Cal: 18 mg/dL (ref 5–40)

## 2023-06-28 LAB — VITAMIN D 25 HYDROXY (VIT D DEFICIENCY, FRACTURES): Vit D, 25-Hydroxy: 91.7 ng/mL (ref 30.0–100.0)

## 2023-06-30 NOTE — Progress Notes (Signed)
Hello Kathryn Young,  Your lab result is normal and/or stable.Some minor variations that are not significant are commonly marked abnormal, but do not represent any medical problem for you.  Best regards, Mechele Claude, M.D.

## 2023-08-28 DIAGNOSIS — H1045 Other chronic allergic conjunctivitis: Secondary | ICD-10-CM | POA: Diagnosis not present

## 2023-08-28 DIAGNOSIS — H26492 Other secondary cataract, left eye: Secondary | ICD-10-CM | POA: Diagnosis not present

## 2023-08-28 DIAGNOSIS — H04123 Dry eye syndrome of bilateral lacrimal glands: Secondary | ICD-10-CM | POA: Diagnosis not present

## 2023-08-28 DIAGNOSIS — Z961 Presence of intraocular lens: Secondary | ICD-10-CM | POA: Diagnosis not present

## 2023-09-04 DIAGNOSIS — H26491 Other secondary cataract, right eye: Secondary | ICD-10-CM | POA: Diagnosis not present

## 2023-09-06 ENCOUNTER — Ambulatory Visit: Payer: Medicare HMO | Attending: Cardiology | Admitting: Cardiology

## 2023-09-06 VITALS — BP 132/78 | HR 70 | Ht 63.0 in | Wt 138.6 lb

## 2023-09-06 DIAGNOSIS — I340 Nonrheumatic mitral (valve) insufficiency: Secondary | ICD-10-CM

## 2023-09-06 DIAGNOSIS — I10 Essential (primary) hypertension: Secondary | ICD-10-CM | POA: Diagnosis not present

## 2023-09-06 DIAGNOSIS — E782 Mixed hyperlipidemia: Secondary | ICD-10-CM

## 2023-09-06 MED ORDER — ATORVASTATIN CALCIUM 80 MG PO TABS: 80.0000 mg | ORAL_TABLET | Freq: Every day | ORAL | 6 refills | Status: DC

## 2023-09-06 MED ORDER — AMLODIPINE BESYLATE 5 MG PO TABS: 5.0000 mg | ORAL_TABLET | Freq: Every day | ORAL | 6 refills | Status: DC

## 2023-09-06 NOTE — Patient Instructions (Addendum)
Medication Instructions:   Increase Norvasc to 5mg  daily Increase Atorvastatin to 80mg  daily  Continue all other medications.     Labwork:  none  Testing/Procedures:  Your physician has requested that you have an echocardiogram. Echocardiography is a painless test that uses sound waves to create images of your heart. It provides your doctor with information about the size and shape of your heart and how well your heart's chambers and valves are working. This procedure takes approximately one hour. There are no restrictions for this procedure. Please do NOT wear cologne, perfume, aftershave, or lotions (deodorant is allowed). Please arrive 15 minutes prior to your appointment time.  Please note: We ask at that you not bring children with you during ultrasound (echo/ vascular) testing. Due to room size and safety concerns, children are not allowed in the ultrasound rooms during exams. Our front office staff cannot provide observation of children in our lobby area while testing is being conducted. An adult accompanying a patient to their appointment will only be allowed in the ultrasound room at the discretion of the ultrasound technician under special circumstances. We apologize for any inconvenience. DUE JANUARY 2025  Follow-Up:  6 months   Any Other Special Instructions Will Be Listed Below (If Applicable).  Please update office in 2 weeks with BP readings.   If you need a refill on your cardiac medications before your next appointment, please call your pharmacy.

## 2023-09-06 NOTE — Progress Notes (Signed)
Clinical Summary Kathryn Young is a 77 y.o.female seen today for follow up of the following medical problems.    1. Mitral regurgitation - reports told 25 years ago had heart murmur     02/2018 echo: LVEF 55-60%, grade I diastolic dysfunction, moderate MR, bilateral MV leaflet prolapse   08/2019 echo: LVEF 60-65%, indet DDx, mild to mod MR - no recent symptoms   03/2022 echo: LVEF 65-70%, no WMAs, normal diastolic fxn, normal RV, at least moderate with bileaflet prolapse - no SOB/DOE, no LE edema. Exercising twice weekly with senior classes    04/2023 echo: LVEF 55-60%, LVIDs 3, bilateral MV leaflet prolapse, mod to severe MR -on my review of the echo her LVEF is 60%, agree mod to severe MR difficult to quantify given eccentric MR jet  - no SOB/DOE. No recent edema. Goes to exericse clases Tuesday and Fridays for seniors. Walks up basement steps multiple time day without symptoms.       2.HTN Home bp's130s-140s/50s-60s - pcp increased her norvasc to 5 mg alternating 2.5 mg    3.Hyperlipidemia 06/2022 TC 173 TG 114 HDL 57 LDL 96 - 12/2022 TC 409 TG 811 HDL 58 LDL 116 -06/2023 TC 914 TG 782 HDL 55 LDL 115 - she is on atorvastatin 40mg  daily.  Past Medical History:  Diagnosis Date   Brain infection    Age 20   Breast mass, right    Hyperlipidemia    Hypertension    no meds   Meningitis    Age 48   Neuromuscular disorder (HCC)    right shoulder with burning down arm   SUNCT (short unilateral neuralgiform headache, conjunctival inj/tear) 02/16/2017     No Known Allergies   Current Outpatient Medications  Medication Sig Dispense Refill   acetaminophen (TYLENOL) 500 MG tablet Take 500 mg by mouth every 6 (six) hours as needed.     amLODipine (NORVASC) 5 MG tablet Take 0.5 tablets (2.5 mg total) by mouth daily. 45 tablet 3   atorvastatin (LIPITOR) 40 MG tablet Take 1 tablet (40 mg total) by mouth daily. 30 tablet 6   calcium citrate-vitamin D (CITRACAL+D) 315-200  MG-UNIT tablet Take 1 tablet by mouth 2 (two) times daily.     DULoxetine (CYMBALTA) 30 MG capsule Take 1 capsule (30 mg total) by mouth daily. 90 capsule 3   gabapentin (NEURONTIN) 300 MG capsule TAKE (1) OR (2) CAPSULES at bedtime 180 capsule 3   Olopatadine HCl (PATADAY OP) Apply to eye.     pantoprazole (PROTONIX) 40 MG tablet TAKE (1) TABLET DAILY FOR STOMACH 90 tablet 3   Propylene Glycol (SYSTANE COMPLETE OP) Apply to eye.     raloxifene (EVISTA) 60 MG tablet Take 1 tablet (60 mg total) by mouth daily. 90 tablet 3   Vitamin D, Ergocalciferol, (DRISDOL) 1.25 MG (50000 UNIT) CAPS capsule Take 1 capsule (50,000 Units total) by mouth every 7 (seven) days. 13 capsule 3   Zinc 50 MG TABS Take by mouth.     No current facility-administered medications for this visit.     Past Surgical History:  Procedure Laterality Date   ABDOMINAL HYSTERECTOMY  1990   BREAST BIOPSY Right 02/02/2016   BREAST EXCISIONAL BIOPSY Right 03/27/2016   BREAST EXCISIONAL BIOPSY Left    years ago   BREAST LUMPECTOMY Right 2017   BREAST LUMPECTOMY Left    pt states she is not sure of when or where believes it was in [place].  BREAST LUMPECTOMY WITH RADIOACTIVE SEED LOCALIZATION Right 03/27/2016   Procedure: RIGHT BREAST LUMPECTOMYAFTER RADIOACTIVE SEED LOCALIZATION;  Surgeon: Avel Peace, MD;  Location: [facility];  Service: General;  Laterality: Right;  RIGHT BREAST LUMPECTOMYAFTER RADIOACTIVE SEED LOCALIZATION   BREAST SURGERY Left 1981   benign cysts removed   CATARACT EXTRACTION Bilateral 2021   Kidney Stones     MOHS SURGERY     nose, left thigh, back     No Known Allergies    Family History  Problem Relation Age of Onset   Cancer Mother        Breast   Breast cancer Mother    Other Father        Drowned   Prostate cancer Paternal Grandfather    Heart attack Paternal Grandfather    Pneumonia Brother    Hypertension Son    Hyperlipidemia Son      Social  History Kathryn Young reports that she has never smoked. She has never been exposed to tobacco smoke. She has never used smokeless tobacco. Kathryn Young reports no history of alcohol use.   Review of Systems CONSTITUTIONAL: No weight loss, fever, chills, weakness or fatigue.  HEENT: Eyes: No visual loss, blurred vision, double vision or yellow sclerae.No hearing loss, sneezing, congestion, runny nose or sore throat.  SKIN: No rash or itching.  CARDIOVASCULAR: per hpi RESPIRATORY: No shortness of breath, cough or sputum.  GASTROINTESTINAL: No anorexia, nausea, vomiting or diarrhea. No abdominal pain or blood.  GENITOURINARY: No burning on urination, no polyuria NEUROLOGICAL: No headache, dizziness, syncope, paralysis, ataxia, numbness or tingling in the extremities. No change in bowel or bladder control.  MUSCULOSKELETAL: No muscle, back pain, joint pain or stiffness.  LYMPHATICS: No enlarged nodes. No history of splenectomy.  PSYCHIATRIC: No history of depression or anxiety.  ENDOCRINOLOGIC: No reports of sweating, cold or heat intolerance. No polyuria or polydipsia.  Marland Kitchen   Physical Examination Today's Vitals   09/06/23 1123  BP: 132/78  Pulse: 70  SpO2: 99%  Weight: 138 lb 9.6 oz (62.9 kg)  Height: 5\' 3"  (1.6 m)   Body mass index is 24.55 kg/m.  Gen: resting comfortably, no acute distress HEENT: no scleral icterus, pupils equal round and reactive, no palptable cervical adenopathy,  CV: RRR, 3/6 systolic murmur apex, no jvd Resp: Clear to auscultation bilaterally GI: abdomen is soft, non-tender, non-distended, normal bowel sounds, no hepatosplenomegaly MSK: extremities are warm, no edema.  Skin: warm, no rash Neuro:  no focal deficits Psych: appropriate affect   Diagnostic Studies  02/2018 echo Study Conclusions   - Left ventricle: The cavity size was normal. Wall thickness was   increased in a pattern of mild LVH. Systolic function was normal.   The estimated ejection  fraction was in the range of 55% to 60%.   Wall motion was normal; there were no regional wall motion   abnormalities. Doppler parameters are consistent with abnormal   left ventricular relaxation (grade 1 diastolic dysfunction).   Doppler parameters are consistent with indeterminate ventricular   filling pressure. - Mitral valve: Mild bileaflet prolapse. Mildly thickened leaflets   . There was moderate regurgitation. - Left atrium: The atrium was severely dilated. - Tricuspid valve: There was mild regurgitation.     08/2019 echo IMPRESSIONS     1. Left ventricular ejection fraction, by visual estimation, is 60 to  65%. The left ventricle has normal function. There is mildly increased  left ventricular hypertrophy.   2. Left ventricular  diastolic parameters are indeterminate.   3. Global right ventricle has normal systolic function.The right  ventricular size is normal. No increase in right ventricular wall  thickness.   4. Left atrial size was mildly dilated.   5. Right atrial size was normal.   6. The mitral valve is abnormal. Mild to moderate mitral valve  regurgitation. No evidence of mitral stenosis.   7. 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 The MR jet is eccentric  and posterior, which may lead to underestimation. The MV/AV TVI ratio is 1  suggestiong mild to moderate MR.   8. The tricuspid valve is normal in structure. Tricuspid valve  regurgitation is mild.   9. The aortic valve is tricuspid. Aortic valve regurgitation is not  visualized. No evidence of aortic valve sclerosis or stenosis.  10. The pulmonic valve was not well visualized. Pulmonic valve  regurgitation is not visualized.  11. Normal pulmonary artery systolic pressure.  12. The inferior vena cava is normal in size with greater than 50%  respiratory variability, suggesting right atrial pressure of 3 mmHg.  13. 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      03/2022 echo IMPRESSIONS      1. Left ventricular ejection fraction, by estimation, is 65 to 70%. The  left ventricle has normal function. The left ventricle has no regional  wall motion abnormalities. Left ventricular diastolic parameters were  normal.   2. Right ventricular systolic function is normal. The right ventricular  size is normal. There is normal pulmonary artery systolic pressure.   3. Left atrial size was moderately dilated.   4. 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 Multiple jets of MR, anterior  and posteriorly directed. Difficult to quantitate, appears at least  moderate in severity. Consider TEE to further define mechanism and  severity of MR. The mitral valve is  myxomatous.   5. The aortic valve is tricuspid. Aortic valve regurgitation is not  visualized.   6. The inferior vena cava is normal in size with greater than 50%  respiratory variability, suggesting right atrial pressure of 3 mmHg.   04/2023 echo 1. Left ventricular ejection fraction, by estimation, is 55 to 60%. The  left ventricle has normal function. The left ventricle has no regional  wall motion abnormalities. The left ventricular internal cavity size was  mildly dilated. Left ventricular  diastolic parameters were normal.   2. Right ventricular systolic function is normal. The right ventricular  size is normal. There is normal pulmonary artery systolic pressure.   3. Left atrial size was mildly dilated.   4. Myxomatous leaflets with bi leaflet prolapse worse for posterior  leaflet Multiple jets predominently anterior Splay artifact See prior  recommendations regarding TEE if clinically indicated . The mitral valve  is myxomatous. Moderate to severe mitral  valve regurgitation. No evidence of mitral stenosis.   5. The aortic valve is tricuspid. There is mild calcification of the  aortic valve. Aortic valve regurgitation is not visualized. Aortic valve  sclerosis is present, with no evidence of aortic valve stenosis.   6.  The inferior vena cava is normal in size with greater than 50%  respiratory variability, suggesting right atrial pressure of 3 mmHg.     Assessment and Plan   1. Mitral regurgitation -most recent echo moderate to severe MR. LVEF 60%, LVIDs 3. She is asymptomatic - continue close surveilalnce, repeat TTE Jan 2025. If onset of symptoms or changes in LV function or size would pursue TEE.  2. HTN - elevated bp at home though close to goal here - increase norvasc to 10mg  daily.  - submit bp log 2 weeks   3. Hyperlipidemia - LDL is above goal, increase atorvastatin to 80mg  daily.        Antoine Poche, M.D.

## 2023-10-04 ENCOUNTER — Encounter: Payer: Self-pay | Admitting: Cardiology

## 2023-10-29 ENCOUNTER — Other Ambulatory Visit: Payer: Medicare HMO

## 2023-11-01 ENCOUNTER — Ambulatory Visit: Payer: Medicare HMO | Attending: Cardiology

## 2023-11-01 DIAGNOSIS — I340 Nonrheumatic mitral (valve) insufficiency: Secondary | ICD-10-CM

## 2023-11-01 LAB — ECHOCARDIOGRAM COMPLETE
AR max vel: 2.71 cm2
AV Area VTI: 2.47 cm2
AV Area mean vel: 2.19 cm2
AV Mean grad: 4 mm[Hg]
AV Peak grad: 7.6 mm[Hg]
Ao pk vel: 1.38 m/s
Area-P 1/2: 3.31 cm2
Calc EF: 50.6 %
MV M vel: 6.41 m/s
MV Peak grad: 164.4 mm[Hg]
MV VTI: 1.94 cm2
S' Lateral: 3.9 cm
Single Plane A2C EF: 56.2 %
Single Plane A4C EF: 42.9 %

## 2023-11-29 ENCOUNTER — Telehealth: Payer: Self-pay | Admitting: Cardiology

## 2023-11-29 NOTE — Telephone Encounter (Signed)
Pt returning nurses call regarding Echo results. Please advise

## 2023-11-30 NOTE — Telephone Encounter (Signed)
Notified via detailed voice message.  Copy to pcp.   November 29, 2023 Me     11/29/23  1:30 PM Result Note Left message to return call. November 23, 2023 Antoine Poche, MD to [facility]     11/23/23  7:34 AM Result Note Echo shows heart pumping function is normal. MItral valve has a moderate to severe leak, something to  continue to monitor at this time. Dominga Ferry MD

## 2023-12-25 ENCOUNTER — Ambulatory Visit (INDEPENDENT_AMBULATORY_CARE_PROVIDER_SITE_OTHER): Payer: Medicare HMO | Admitting: Family Medicine

## 2023-12-25 ENCOUNTER — Ambulatory Visit (INDEPENDENT_AMBULATORY_CARE_PROVIDER_SITE_OTHER)

## 2023-12-25 ENCOUNTER — Encounter: Payer: Self-pay | Admitting: Family Medicine

## 2023-12-25 VITALS — BP 119/65 | HR 83 | Temp 97.3°F | Ht 63.0 in | Wt 134.0 lb

## 2023-12-25 DIAGNOSIS — E782 Mixed hyperlipidemia: Secondary | ICD-10-CM | POA: Diagnosis not present

## 2023-12-25 DIAGNOSIS — R202 Paresthesia of skin: Secondary | ICD-10-CM

## 2023-12-25 DIAGNOSIS — Z780 Asymptomatic menopausal state: Secondary | ICD-10-CM

## 2023-12-25 DIAGNOSIS — I10 Essential (primary) hypertension: Secondary | ICD-10-CM | POA: Diagnosis not present

## 2023-12-25 DIAGNOSIS — K219 Gastro-esophageal reflux disease without esophagitis: Secondary | ICD-10-CM | POA: Diagnosis not present

## 2023-12-25 DIAGNOSIS — G500 Trigeminal neuralgia: Secondary | ICD-10-CM

## 2023-12-25 DIAGNOSIS — Z136 Encounter for screening for cardiovascular disorders: Secondary | ICD-10-CM | POA: Diagnosis not present

## 2023-12-25 LAB — LIPID PANEL

## 2023-12-25 MED ORDER — PANTOPRAZOLE SODIUM 40 MG PO TBEC: 40.0000 mg | DELAYED_RELEASE_TABLET | Freq: Two times a day (BID) | ORAL | 3 refills | Status: AC

## 2023-12-25 MED ORDER — DULOXETINE HCL 30 MG PO CPEP: 30.0000 mg | ORAL_CAPSULE | Freq: Every day | ORAL | 3 refills | Status: AC

## 2023-12-25 MED ORDER — GABAPENTIN 300 MG PO CAPS: ORAL_CAPSULE | ORAL | 3 refills | Status: AC

## 2023-12-25 NOTE — Addendum Note (Signed)
 Addended by: Leda Min D on: 12/25/2023 08:52 AM   Modules accepted: Orders

## 2023-12-25 NOTE — Progress Notes (Signed)
 Subjective:  Patient ID: Kathryn Young, female    DOB: [birth year]  Age: 78 y.o. MRN: [id number]  CC: Weight Loss (Weight loss since husband passed in Nov 07, 2023. Also having GERD which might be contributing. )   HPI Kathryn Young presents for  Husband died in 11-07-23 of Pneumonia & CHF. Getting exercise & cleaning her church. Gets out daily. CLOSE FRIEND IS [name]  Hiatal hernia flaring. Taking protonix BID to help.    in for follow-up of elevated cholesterol. Doing well without complaints on current medication. Denies side effects of statin including myalgia and arthralgia and nausea. Currently no chest pain, shortness of breath or other cardiovascular related symptoms noted.   presents for  follow-up of hypertension. Patient has no history of headache chest pain or shortness of breath or recent cough. Patient also denies symptoms of TIA such as focal numbness or weakness. Patient denies side effects from medication. States taking it regularly.       06/27/2023    7:47 AM 01/16/2023    9:23 AM 12/25/2022    7:59 AM  Depression screen PHQ 2/9  Decreased Interest 0 0 0  Down, Depressed, Hopeless 0 0 0  PHQ - 2 Score 0 0 0    History Kathryn Young has a past medical history of Brain infection, Breast mass, right, Hyperlipidemia, Hypertension, Meningitis, Neuromuscular disorder (HCC), and SUNCT (short unilateral neuralgiform headache, conjunctival inj/tear) (02/16/2017).   She has a past surgical history that includes Abdominal hysterectomy (1990); Kidney Stones; Breast surgery (Left, 1981); Breast lumpectomy with radioactive seed localization (Right, 03/27/2016); Breast lumpectomy (Right, 2017); Breast lumpectomy (Left); Cataract extraction (Bilateral, 2021); Breast biopsy (Right, 02/02/2016); Breast excisional biopsy (Right, 03/27/2016); Breast excisional biopsy (Left); and Mohs surgery.   Her family history includes Breast cancer in her mother; Cancer in her mother; Heart attack in her  paternal grandfather; Hyperlipidemia in her son; Hypertension in her son; Other in her father; Pneumonia in her brother; Prostate cancer in her paternal grandfather.She reports that she has never smoked. She has never been exposed to tobacco smoke. She has never used smokeless tobacco. She reports that she does not drink alcohol and does not use drugs.    ROS Review of Systems  Constitutional: Negative.   HENT: Negative.    Eyes:  Negative for visual disturbance.  Respiratory:  Negative for shortness of breath.   Cardiovascular:  Negative for chest pain.  Gastrointestinal:  Negative for abdominal pain.  Musculoskeletal:  Negative for arthralgias.    Objective:  BP 119/65   Pulse 83   Temp (!) 97.3 F (36.3 C)   Ht 5\' 3"  (1.6 m)   Wt 134 lb (60.8 kg)   SpO2 97%   BMI 23.74 kg/m   BP Readings from Last 3 Encounters:  12/25/23 119/65  09/06/23 132/78  06/27/23 139/75    Wt Readings from Last 3 Encounters:  12/25/23 134 lb (60.8 kg)  09/06/23 138 lb 9.6 oz (62.9 kg)  06/27/23 140 lb (63.5 kg)     Physical Exam Constitutional:      General: She is not in acute distress.    Appearance: She is well-developed.  Cardiovascular:     Rate and Rhythm: Normal rate and regular rhythm.  Pulmonary:     Breath sounds: Normal breath sounds.  Musculoskeletal:        General: Normal range of motion.  Skin:    General: Skin is warm and dry.  Neurological:     Mental Status:  She is alert and oriented to person, place, and time.       Assessment & Plan:   Kathryn Young was seen today for weight loss.  Diagnoses and all orders for this visit:  Mixed hyperlipidemia  Gastroesophageal reflux disease without esophagitis -     pantoprazole (PROTONIX) 40 MG tablet; Take 1 tablet (40 mg total) by mouth 2 (two) times daily. -     CBC with Differential/Platelet -     CMP14+EGFR -     Lipid panel  Trigeminal neuralgia of right side of face -     DULoxetine (CYMBALTA) 30 MG capsule; Take  1 capsule (30 mg total) by mouth daily. -     CBC with Differential/Platelet -     CMP14+EGFR -     Lipid panel  Notalgia paresthetica -     DULoxetine (CYMBALTA) 30 MG capsule; Take 1 capsule (30 mg total) by mouth daily. -     gabapentin (NEURONTIN) 300 MG capsule; TAKE (1) OR (2) CAPSULES at bedtime -     CBC with Differential/Platelet -     CMP14+EGFR -     Lipid panel  Primary hypertension       I have changed Journiee B. Bussa's pantoprazole. I am also having her maintain her Olopatadine HCl (PATADAY OP), Propylene Glycol (SYSTANE COMPLETE OP), calcium citrate-vitamin D, Zinc, acetaminophen, raloxifene, Vitamin D (Ergocalciferol), amLODipine, atorvastatin, DULoxetine, and gabapentin.  Allergies as of 12/25/2023   No Known Allergies      Medication List        Accurate as of December 25, 2023  8:42 AM. If you have any questions, ask your nurse or doctor.          acetaminophen 500 MG tablet Commonly known as: TYLENOL Take 500 mg by mouth every 6 (six) hours as needed.   amLODipine 5 MG tablet Commonly known as: NORVASC Take 1 tablet (5 mg total) by mouth daily.   atorvastatin 80 MG tablet Commonly known as: LIPITOR Take 1 tablet (80 mg total) by mouth daily.   calcium citrate-vitamin D 315-200 MG-UNIT tablet Commonly known as: CITRACAL+D Take 1 tablet by mouth 2 (two) times daily.   DULoxetine 30 MG capsule Commonly known as: CYMBALTA Take 1 capsule (30 mg total) by mouth daily.   gabapentin 300 MG capsule Commonly known as: NEURONTIN TAKE (1) OR (2) CAPSULES at bedtime   pantoprazole 40 MG tablet Commonly known as: PROTONIX Take 1 tablet (40 mg total) by mouth 2 (two) times daily. What changed:  how much to take how to take this when to take this additional instructions Changed by: Shayleen Eppinger   PATADAY OP Apply to eye.   raloxifene 60 MG tablet Commonly known as: EVISTA Take 1 tablet (60 mg total) by mouth daily.   SYSTANE COMPLETE  OP Apply to eye.   Vitamin D (Ergocalciferol) 1.25 MG (50000 UNIT) Caps capsule Commonly known as: DRISDOL Take 1 capsule (50,000 Units total) by mouth every 7 (seven) days.   Zinc 50 MG Tabs Take by mouth.         Follow-up: Return in about 6 months (around 06/26/2024) for [facility] physical.  Mechele Claude, M.D.

## 2023-12-26 DIAGNOSIS — Z780 Asymptomatic menopausal state: Secondary | ICD-10-CM | POA: Diagnosis not present

## 2023-12-26 DIAGNOSIS — M8589 Other specified disorders of bone density and structure, multiple sites: Secondary | ICD-10-CM | POA: Diagnosis not present

## 2023-12-26 LAB — CBC WITH DIFFERENTIAL/PLATELET
Basophils Absolute: 0 10*3/uL (ref 0.0–0.2)
Basos: 0 %
EOS (ABSOLUTE): 0.1 10*3/uL (ref 0.0–0.4)
Eos: 2 %
Hematocrit: 35.8 % (ref 34.0–46.6)
Hemoglobin: 12.5 g/dL (ref 11.1–15.9)
Immature Grans (Abs): 0 10*3/uL (ref 0.0–0.1)
Immature Granulocytes: 0 %
Lymphocytes Absolute: 1.9 10*3/uL (ref 0.7–3.1)
Lymphs: 34 %
MCH: 32.8 pg (ref 26.6–33.0)
MCHC: 34.9 g/dL (ref 31.5–35.7)
MCV: 94 fL (ref 79–97)
Monocytes Absolute: 0.5 10*3/uL (ref 0.1–0.9)
Monocytes: 9 %
Neutrophils Absolute: 3 10*3/uL (ref 1.4–7.0)
Neutrophils: 55 %
Platelets: 196 10*3/uL (ref 150–450)
RBC: 3.81 x10E6/uL (ref 3.77–5.28)
RDW: 12.1 % (ref 11.7–15.4)
WBC: 5.6 10*3/uL (ref 3.4–10.8)

## 2023-12-26 LAB — CMP14+EGFR
ALT: 13 IU/L (ref 0–32)
AST: 17 IU/L (ref 0–40)
Albumin: 4.3 g/dL (ref 3.8–4.8)
Alkaline Phosphatase: 103 IU/L (ref 44–121)
BUN/Creatinine Ratio: 15 (ref 12–28)
BUN: 12 mg/dL (ref 8–27)
Bilirubin Total: 0.7 mg/dL (ref 0.0–1.2)
CO2: 24 mmol/L (ref 20–29)
Calcium: 9.2 mg/dL (ref 8.7–10.3)
Chloride: 104 mmol/L (ref 96–106)
Creatinine, Ser: 0.79 mg/dL (ref 0.57–1.00)
Globulin, Total: 1.9 g/dL (ref 1.5–4.5)
Glucose: 113 mg/dL — ABNORMAL HIGH (ref 70–99)
Potassium: 3.8 mmol/L (ref 3.5–5.2)
Sodium: 142 mmol/L (ref 134–144)
Total Protein: 6.2 g/dL (ref 6.0–8.5)
eGFR: 77 mL/min/{1.73_m2} (ref >59)

## 2023-12-26 LAB — LIPID PANEL
Cholesterol, Total: 123 mg/dL (ref 100–199)
HDL: 51 mg/dL (ref >39)
LDL CALC COMMENT:: 2.4 ratio (ref 0.0–4.4)
LDL Chol Calc (NIH): 58 mg/dL (ref 0–99)
Triglycerides: 69 mg/dL (ref 0–149)
VLDL Cholesterol Cal: 14 mg/dL (ref 5–40)

## 2023-12-29 NOTE — Progress Notes (Signed)
 DEXA shows osteopenia. I recommend switching from Evista to weekly fosamax. Nurse, if pt. Is agreeable, send in Fosamax 70 mg weekly, #13.  Thanks, WS

## 2023-12-29 NOTE — Progress Notes (Signed)
Hello Emyia,  Your lab result is normal and/or stable.Some minor variations that are not significant are commonly marked abnormal, but do not represent any medical problem for you.  Best regards, Mechele Claude, M.D.

## 2023-12-31 ENCOUNTER — Other Ambulatory Visit: Payer: Self-pay

## 2023-12-31 MED ORDER — ALENDRONATE SODIUM 70 MG PO TABS: 70.0000 mg | ORAL_TABLET | ORAL | 3 refills | Status: AC

## 2024-01-17 ENCOUNTER — Ambulatory Visit: Payer: Medicare HMO

## 2024-01-17 VITALS — BP 119/65 | HR 83 | Ht 63.0 in | Wt 134.0 lb

## 2024-01-17 DIAGNOSIS — Z0000 Encounter for general adult medical examination without abnormal findings: Secondary | ICD-10-CM | POA: Diagnosis not present

## 2024-01-17 NOTE — Progress Notes (Signed)
 Subjective:   Kathryn Young is a 78 y.o. who presents for a Medicare Wellness preventive visit.  Visit Complete: Virtual I connected with  Kathryn Young on 01/17/24 by a audio enabled telemedicine application and verified that I am speaking with the correct person using two identifiers.  Patient Location: Home  Provider Location: Home Office  I discussed the limitations of evaluation and management by telemedicine. The patient expressed understanding and agreed to proceed.  Vital Signs: Because this visit was a virtual/telehealth visit, some criteria may be missing or patient reported. Any vitals not documented were not able to be obtained and vitals that have been documented are patient reported.  VideoDeclined- This patient declined Librarian, academic. Therefore the visit was completed with audio only.  Persons Participating in Visit: Patient.  AWV Questionnaire: No: Patient Medicare AWV questionnaire was not completed prior to this visit.  Cardiac Risk Factors include: advanced age (>19men, >68 women);dyslipidemia;hypertension     Objective:    Today's Vitals   01/17/24 0852  BP: 119/65  Pulse: 83  Weight: 134 lb (60.8 kg)  Height: 5\' 3"  (1.6 m)   Body mass index is 23.74 kg/m.     01/17/2024    9:14 AM 01/16/2023    9:24 AM 01/12/2022   10:05 AM 01/10/2021    9:58 AM 01/05/2020    8:37 AM 06/03/2019    2:18 PM 12/19/2017   11:52 AM  Advanced Directives  Does Patient Have a Medical Advance Directive? Yes Yes Yes Yes Yes Yes Yes  Type of Estate agent of [place];Living will Healthcare Power of [place];Living will Healthcare Power of [place];Living will Living will Living will Living will Living will  Does patient want to make changes to medical advance directive?    No - Patient declined  No - Patient declined --  Copy of Healthcare Power of Attorney in Chart? No - copy requested No - copy requested No - copy requested       Would patient like information on creating a medical advance directive?    No - Patient declined       Current Medications (verified) Outpatient Encounter Medications as of 01/17/2024  Medication Sig   acetaminophen (TYLENOL) 500 MG tablet Take 500 mg by mouth every 6 (six) hours as needed.   alendronate (FOSAMAX) 70 MG tablet Take 1 tablet (70 mg total) by mouth once a week. Take with a full glass of water on an empty stomach.   amLODipine (NORVASC) 5 MG tablet Take 1 tablet (5 mg total) by mouth daily.   atorvastatin (LIPITOR) 80 MG tablet Take 1 tablet (80 mg total) by mouth daily.   calcium citrate-vitamin D (CITRACAL+D) 315-200 MG-UNIT tablet Take 1 tablet by mouth 2 (two) times daily.   DULoxetine (CYMBALTA) 30 MG capsule Take 1 capsule (30 mg total) by mouth daily.   gabapentin (NEURONTIN) 300 MG capsule TAKE (1) OR (2) CAPSULES at bedtime   Olopatadine HCl (PATADAY OP) Apply to eye.   pantoprazole (PROTONIX) 40 MG tablet Take 1 tablet (40 mg total) by mouth 2 (two) times daily.   Propylene Glycol (SYSTANE COMPLETE OP) Apply to eye.   raloxifene (EVISTA) 60 MG tablet Take 1 tablet (60 mg total) by mouth daily.   Vitamin D, Ergocalciferol, (DRISDOL) 1.25 MG (50000 UNIT) CAPS capsule Take 1 capsule (50,000 Units total) by mouth every 7 (seven) days.   Zinc 50 MG TABS Take by mouth.   No facility-administered encounter  medications on file as of 01/17/2024.    Allergies (verified) Patient has no known allergies.   History: Past Medical History:  Diagnosis Date   Brain infection    Age 19   Breast mass, right    Hyperlipidemia    Hypertension    no meds   Meningitis    Age 69   Neuromuscular disorder (HCC)    right shoulder with burning down arm   SUNCT (short unilateral neuralgiform headache, conjunctival inj/tear) 02/16/2017   Past Surgical History:  Procedure Laterality Date   ABDOMINAL HYSTERECTOMY  1990   BREAST BIOPSY Right 02/02/2016   BREAST EXCISIONAL BIOPSY  Right 03/27/2016   BREAST EXCISIONAL BIOPSY Left    years ago   BREAST LUMPECTOMY Right 2017   BREAST LUMPECTOMY Left    pt states she is not sure of when or where believes it was in [place].   BREAST LUMPECTOMY WITH RADIOACTIVE SEED LOCALIZATION Right 03/27/2016   Procedure: RIGHT BREAST LUMPECTOMYAFTER RADIOACTIVE SEED LOCALIZATION;  Surgeon: Avel Peace, MD;  Location: [facility];  Service: General;  Laterality: Right;  RIGHT BREAST LUMPECTOMYAFTER RADIOACTIVE SEED LOCALIZATION   BREAST SURGERY Left 1981   benign cysts removed   CATARACT EXTRACTION Bilateral 2021   Kidney Stones     MOHS SURGERY     nose, left thigh, back   Family History  Problem Relation Age of Onset   Cancer Mother        Breast   Breast cancer Mother    Other Father        Drowned   Prostate cancer Paternal Grandfather    Heart attack Paternal Grandfather    Pneumonia Brother    Hypertension Son    Hyperlipidemia Son    Social History   Socioeconomic History   Marital status: Married    Spouse name: Kathryn Young   Number of children: 1   Years of education: 12   Highest education level: High school graduate  Occupational History   Occupation: Retired    Comment: Designer, fashion/clothing  Tobacco Use   Smoking status: Never    Passive exposure: Never   Smokeless tobacco: Never  Vaping Use   Vaping status: Never Used  Substance and Sexual Activity   Alcohol use: No   Drug use: No   Sexual activity: Yes    Partners: Male    Birth control/protection: Surgical    Comment: Married  Other Topics Concern   Not on file  Social History Narrative   Lives at home w/ her husband and grandson   Right-handed   Caffeine: 2 cups of coffee and tea throughout the day   Son lives in her basement apartment   [place] grandson lives in cabin next door.   Social Drivers of Corporate investment banker Strain: Low Risk  (01/17/2024)   Overall Financial Resource Strain (CARDIA)    Difficulty of Paying Living  Expenses: Not hard at all  Food Insecurity: No Food Insecurity (01/17/2024)   Hunger Vital Sign    Worried About Running Out of Food in the Last Year: Never true    Ran Out of Food in the Last Year: Never true  Transportation Needs: No Transportation Needs (01/17/2024)   PRAPARE - Administrator, Civil Service (Medical): No    Lack of Transportation (Non-Medical): No  Physical Activity: Sufficiently Active (01/17/2024)   Exercise Vital Sign    Days of Exercise per Week: 7 days    Minutes of  Exercise per Session: 60 min  Stress: No Stress Concern Present (01/17/2024)   [employer] of Occupational Health - Occupational Stress Questionnaire    Feeling of Stress : Not at all  Social Connections: Moderately Integrated (01/17/2024)   Social Connection and Isolation Panel [NHANES]    Frequency of Communication with Friends and Family: More than three times a week    Frequency of Social Gatherings with Friends and Family: More than three times a week    Attends Religious Services: More than 4 times per year    Active Member of [employer] or Organizations: Yes    Attends Banker Meetings: More than 4 times per year    Marital Status: Widowed    Tobacco Counseling Counseling given: Yes    Clinical Intake:  Pre-visit preparation completed: Yes  Pain : No/denies pain     BMI - recorded: 23.74 Nutritional Risks: None Diabetes: No  No results found for: "HGBA1C"   How often do you need to have someone help you when you read instructions, pamphlets, or other written materials from your doctor or pharmacy?: 1 - Never  Interpreter Needed?: No  Information entered by :: Zane Herald T/CMA   Activities of Daily Living     01/17/2024    9:06 AM  In your present state of health, do you have any difficulty performing the following activities:  Hearing? 0  Vision? 0  Difficulty concentrating or making decisions? 1  Comment pt forget peoples name/forget where pt put things   Walking or climbing stairs? 0  Dressing or bathing? 0  Doing errands, shopping? 0  Preparing Food and eating ? N  Using the Toilet? N  In the past six months, have you accidently leaked urine? Y  Do you have problems with loss of bowel control? N  Managing your Medications? N  Managing your Finances? N  Housekeeping or managing your Housekeeping? N    Patient Care Team: Mechele Claude, MD as PCP - General (Family Medicine) Wyline Mood, Dorothe Pea, MD as PCP - Cardiology (Cardiology) Sallye Lat, MD as Consulting Physician (Ophthalmology) Venancio Poisson, MD as Consulting Physician (Dermatology) Adam Phenix, DPM as Consulting Physician (Podiatry)  Indicate any recent Medical Services you may have received from other than [facility] providers in the past year (date may be approximate).     Assessment:   This is a routine wellness examination for Kathryn Young.  Hearing/Vision screen Hearing Screening - Comments:: Pt denies hearing def Vision Screening - Comments:: Pt denies vision def Pt goes to Dr. Dione Booze in [place]   Goals Addressed             This Visit's Progress    Patient Stated       Pt would like to fix up the house ie new doors/and go to the beach this year       Depression Screen     01/17/2024    9:22 AM 06/27/2023    7:47 AM 01/16/2023    9:23 AM 12/25/2022    7:59 AM 06/26/2022    7:59 AM 01/12/2022    9:58 AM 12/20/2021    7:55 AM  PHQ 2/9 Scores  PHQ - 2 Score 0 0 0 0 0 0 0  PHQ- 9 Score 1          Fall Risk     01/17/2024    9:00 AM 06/27/2023    7:47 AM 01/16/2023    9:22 AM 12/25/2022    7:59 AM 06/26/2022  7:59 AM  Fall Risk   Falls in the past year? 0 0 0 0 0  Number falls in past yr: 0  0    Injury with Fall? 0  0    Risk for fall due to : No Fall Risks  No Fall Risks    Follow up Falls prevention discussed;Falls evaluation completed  Falls prevention discussed      MEDICARE RISK AT HOME:  Medicare Risk at Home Any stairs in or around the home?:  No If so, are there any without handrails?: Yes Home free of loose throw rugs in walkways, pet beds, electrical cords, etc?: Yes Adequate lighting in your home to reduce risk of falls?: Yes Life alert?: No Use of a cane, walker or w/c?: No Grab bars in the bathroom?: No Shower chair or bench in shower?: Yes Elevated toilet seat or a handicapped toilet?: No  TIMED UP AND GO:  Was the test performed?  N0  Cognitive Function: 6CIT completed    12/19/2017   11:51 AM  MMSE - Mini Mental State Exam  Orientation to time 5  Orientation to Place 5  Registration 3  Attention/ Calculation 4  Recall 3  Language- name 2 objects 2  Language- repeat 1  Language- follow 3 step command 3  Language- read & follow direction 1  Write a sentence 1  Copy design 1  Total score 29        01/17/2024    9:20 AM 01/16/2023    9:25 AM 01/12/2022   10:26 AM 01/10/2021    9:59 AM 01/05/2020    8:40 AM  6CIT Screen  What Year? 0 points 0 points 0 points 0 points 0 points  What month? 0 points 0 points 0 points 0 points 0 points  What time? 0 points 0 points 0 points 0 points 0 points  Count back from 20 0 points 0 points 0 points 0 points 0 points  Months in reverse 0 points 0 points 0 points 0 points 0 points  Repeat phrase 0 points 0 points 0 points 0 points 0 points  Total Score 0 points 0 points 0 points 0 points 0 points    Immunizations Immunization History  Administered Date(s) Administered   Fluad Quad(high Dose 65+) 07/01/2019, 07/16/2020   Fluad Trivalent(High Dose 65+) 06/27/2023   Influenza, High Dose Seasonal PF 07/31/2016, 07/28/2017   Influenza,inj,Quad PF,6-35 Mos 07/02/2019   Influenza-Unspecified 07/25/2014, 07/25/2015, 07/29/2018   Moderna Sars-Covid-2 Vaccination 12/30/2019, 01/27/2020, 11/02/2020   Pneumococcal Conjugate-13 02/18/2016   Pneumococcal Polysaccharide-23 09/16/2011   Tdap 04/06/2010   Zoster Recombinant(Shingrix) 06/29/2021, 09/15/2021   Zoster, Live  11/21/2007    Screening Tests Health Maintenance  Topic Date Due   DTaP/Tdap/Td (2 - Td or Tdap) 12/24/2024 (Originally 04/06/2020)   COVID-19 Vaccine (4 - 2024-25 season) 02/01/2025 (Originally 06/17/2023)   MAMMOGRAM  03/15/2024   INFLUENZA VACCINE  05/16/2024   Medicare Annual Wellness (AWV)  01/16/2025   DEXA SCAN  12/25/2025   Pneumonia Vaccine 16+ Years old  Completed   Hepatitis C Screening  Completed   Zoster Vaccines- Shingrix  Completed   HPV VACCINES  Aged Out   Colonoscopy  Discontinued    Health Maintenance  There are no preventive care reminders to display for this patient.  Health Maintenance Items Addressed: See Nurse Notes  Additional Screening:  Vision Screening: Recommended annual ophthalmology exams for early detection of glaucoma and other disorders of the eye.  Dental Screening: Recommended annual dental  exams for proper oral hygiene  Community Resource Referral / Chronic Care Management: CRR required this visit?  No   CCM required this visit?  No     Plan:     I have personally reviewed and noted the following in the patient's chart:   Medical and social history Use of alcohol, tobacco or illicit drugs  Current medications and supplements including opioid prescriptions. Patient is not currently taking opioid prescriptions. Functional ability and status Nutritional status Physical activity Advanced directives List of other physicians Hospitalizations, surgeries, and ER visits in previous 12 months Vitals Screenings to include cognitive, depression, and falls Referrals and appointments  In [place], I have reviewed and discussed with patient certain preventive protocols, quality metrics, and best practice recommendations. A written personalized care plan for preventive services as well as general preventive health recommendations were provided to patient.     Arta Silence, CMA   01/17/2024   After Visit Summary: (Declined) Due to this  being a telephonic visit, with patients personalized plan was offered to patient but patient Declined AVS at this time   Notes: Please refer to Routing Comments.

## 2024-01-17 NOTE — Patient Instructions (Signed)
 Kathryn Young , Thank you for taking time to come for your Medicare Wellness Visit. I appreciate your ongoing commitment to your health goals. Please review the following plan we discussed and let me know if I can assist you in the future.   Referrals/Orders/Follow-Ups/Clinician Recommendations: Keep up the excellent work on maintaining your health.  This is a list of the screening recommended for you and due dates:  Health Maintenance  Topic Date Due   DTaP/Tdap/Td vaccine (2 - Td or Tdap) 12/24/2024*   COVID-19 Vaccine (4 - 2024-25 season) 02/01/2025*   Mammogram  03/15/2024   Flu Shot  05/16/2024   Medicare Annual Wellness Visit  01/16/2025   DEXA scan (bone density measurement)  12/25/2025   Pneumonia Vaccine  Completed   Hepatitis C Screening  Completed   Zoster (Shingles) Vaccine  Completed   HPV Vaccine  Aged Out   Colon Cancer Screening  Discontinued  *Topic was postponed. The date shown is not the original due date.    Advanced directives: (Copy Requested) Please bring a copy of your health care power of attorney and living will to the office to be added to your chart at your convenience. You can mail to [facility] [address] [place], [place] [address] or email to [email or web address]  Next Medicare Annual Wellness Visit scheduled for next year: Yes

## 2024-02-19 ENCOUNTER — Other Ambulatory Visit: Payer: Self-pay | Admitting: Family Medicine

## 2024-02-19 DIAGNOSIS — Z1231 Encounter for screening mammogram for malignant neoplasm of breast: Secondary | ICD-10-CM

## 2024-02-25 DIAGNOSIS — Z85828 Personal history of other malignant neoplasm of skin: Secondary | ICD-10-CM | POA: Diagnosis not present

## 2024-02-25 DIAGNOSIS — D225 Melanocytic nevi of trunk: Secondary | ICD-10-CM | POA: Diagnosis not present

## 2024-02-25 DIAGNOSIS — L918 Other hypertrophic disorders of the skin: Secondary | ICD-10-CM | POA: Diagnosis not present

## 2024-02-25 DIAGNOSIS — L570 Actinic keratosis: Secondary | ICD-10-CM | POA: Diagnosis not present

## 2024-02-25 DIAGNOSIS — L720 Epidermal cyst: Secondary | ICD-10-CM | POA: Diagnosis not present

## 2024-02-25 DIAGNOSIS — D2371 Other benign neoplasm of skin of right lower limb, including hip: Secondary | ICD-10-CM | POA: Diagnosis not present

## 2024-02-25 DIAGNOSIS — L821 Other seborrheic keratosis: Secondary | ICD-10-CM | POA: Diagnosis not present

## 2024-02-25 DIAGNOSIS — L820 Inflamed seborrheic keratosis: Secondary | ICD-10-CM | POA: Diagnosis not present

## 2024-03-12 ENCOUNTER — Ambulatory Visit: Payer: Medicare HMO | Admitting: Cardiology

## 2024-03-21 ENCOUNTER — Ambulatory Visit
Admission: RE | Admit: 2024-03-21 | Discharge: 2024-03-21 | Disposition: A | Discharge status: Home / Self Care | Source: Ambulatory Visit | Attending: Family Medicine | Admitting: Family Medicine

## 2024-03-21 DIAGNOSIS — Z1231 Encounter for screening mammogram for malignant neoplasm of breast: Secondary | ICD-10-CM

## 2024-05-12 ENCOUNTER — Encounter: Payer: Self-pay | Admitting: Cardiology

## 2024-05-12 ENCOUNTER — Ambulatory Visit: Attending: Cardiology | Admitting: Cardiology

## 2024-05-12 VITALS — BP 140/62 | HR 79 | Ht 63.5 in | Wt 134.6 lb

## 2024-05-12 DIAGNOSIS — E782 Mixed hyperlipidemia: Secondary | ICD-10-CM

## 2024-05-12 DIAGNOSIS — I10 Essential (primary) hypertension: Secondary | ICD-10-CM | POA: Diagnosis not present

## 2024-05-12 DIAGNOSIS — I340 Nonrheumatic mitral (valve) insufficiency: Secondary | ICD-10-CM

## 2024-05-12 NOTE — Patient Instructions (Signed)
 Medication Instructions:   Continue all current medications.   Labwork:  none  Testing/Procedures:  Your physician has requested that you have an echocardiogram. Echocardiography is a painless test that uses sound waves to create images of your heart. It provides your doctor with information about the size and shape of your heart and how well your heart's chambers and valves are working. This procedure takes approximately one hour. There are no restrictions for this procedure. Please do NOT wear cologne, perfume, aftershave, or lotions (deodorant is allowed). Please arrive 15 minutes prior to your appointment time.  Please note: We ask at that you not bring children with you during ultrasound (echo/ vascular) testing. Due to room size and safety concerns, children are not allowed in the ultrasound rooms during exams. Our front office staff cannot provide observation of children in our lobby area while testing is being conducted. An adult accompanying a patient to their appointment will only be allowed in the ultrasound room at the discretion of the ultrasound technician under special circumstances. We apologize for any inconvenience.  Office will contact with results via phone, letter or mychart.     Follow-Up:  6 months   Any Other Special Instructions Will Be Listed Below (If Applicable).   If you need a refill on your cardiac medications before your next appointment, please call your pharmacy.

## 2024-05-12 NOTE — Progress Notes (Signed)
 Clinical Summary Ms. Kiss is a 78 y.o.female seen today for follow up of the following medical problems.    1. Mitral regurgitation - reports told 25 years ago had heart murmur  02/2018 echo: LVEF 55-60%, grade I diastolic dysfunction, moderate MR, bilateral MV leaflet prolapse  08/2019 echo: LVEF 60-65%, indet DDx, mild to mod MR 03/2022 echo: LVEF 65-70%, no WMAs, normal diastolic fxn, normal RV, at least moderate with bileaflet prolapse 04/2023 echo: LVEF 55-60%, LVIDs 3, bilateral MV leaflet prolapse, mod to severe MR -on my review of the echo her LVEF is 60%, agree mod to severe MR difficult to quantify given eccentric MR jet  -Jan 2025 echo: LVEF 55-60%, mod to severe MR, LVIDs 3.9 cm.  - no SOB/DOE, no LE edema - still going to senior exercise classes twice a week, tolerates well       2.HTN - home bp's typically 120s/60s-70s     3.Hyperlipidemia - 08/2023 visit we increased atorvastatin  to 80mg  daily.   12/2023 TC 123 TG 69 HDL 51 LDL 58     Past Medical History:  Diagnosis Date   Brain infection    Age 24   Breast mass, right    Hyperlipidemia    Hypertension    no meds   Meningitis    Age 74   Neuromuscular disorder (HCC)    right shoulder with burning down arm   SUNCT (short unilateral neuralgiform headache, conjunctival inj/tear) 02/16/2017     No Known Allergies   Current Outpatient Medications  Medication Sig Dispense Refill   acetaminophen  (TYLENOL ) 500 MG tablet Take 500 mg by mouth every 6 (six) hours as needed.     alendronate  (FOSAMAX ) 70 MG tablet Take 1 tablet (70 mg total) by mouth once a week. Take with a full glass of water on an empty stomach. 13 tablet 3   amLODipine  (NORVASC ) 5 MG tablet Take 1 tablet (5 mg total) by mouth daily. 30 tablet 6   atorvastatin  (LIPITOR) 80 MG tablet Take 1 tablet (80 mg total) by mouth daily. 30 tablet 6   calcium  citrate-vitamin D  (CITRACAL+D) 315-200 MG-UNIT tablet Take 1 tablet by mouth 2 (two)  times daily.     DULoxetine  (CYMBALTA ) 30 MG capsule Take 1 capsule (30 mg total) by mouth daily. 90 capsule 3   gabapentin  (NEURONTIN ) 300 MG capsule TAKE (1) OR (2) CAPSULES at bedtime 180 capsule 3   Olopatadine HCl (PATADAY OP) Apply to eye.     pantoprazole  (PROTONIX ) 40 MG tablet Take 1 tablet (40 mg total) by mouth 2 (two) times daily. 180 tablet 3   Propylene Glycol (SYSTANE COMPLETE OP) Apply to eye.     raloxifene  (EVISTA ) 60 MG tablet Take 1 tablet (60 mg total) by mouth daily. 90 tablet 3   Vitamin D , Ergocalciferol , (DRISDOL ) 1.25 MG (50000 UNIT) CAPS capsule Take 1 capsule (50,000 Units total) by mouth every 7 (seven) days. 13 capsule 3   Zinc 50 MG TABS Take by mouth.     No current facility-administered medications for this visit.     Past Surgical History:  Procedure Laterality Date   ABDOMINAL HYSTERECTOMY  1990   BREAST BIOPSY Right 02/02/2016   BREAST EXCISIONAL BIOPSY Right 03/27/2016   BREAST EXCISIONAL BIOPSY Left    years ago   BREAST LUMPECTOMY Right 2017   BREAST LUMPECTOMY Left    pt states she is not sure of when or where believes it was in [place].  BREAST LUMPECTOMY WITH RADIOACTIVE SEED LOCALIZATION Right 03/27/2016   Procedure: RIGHT BREAST LUMPECTOMYAFTER RADIOACTIVE SEED LOCALIZATION;  Surgeon: Krystal Russell, MD;  Location: [facility];  Service: General;  Laterality: Right;  RIGHT BREAST LUMPECTOMYAFTER RADIOACTIVE SEED LOCALIZATION   BREAST SURGERY Left 1981   benign cysts removed   CATARACT EXTRACTION Bilateral 2021   Kidney Stones     MOHS SURGERY     nose, left thigh, back     No Known Allergies    Family History  Problem Relation Age of Onset   Cancer Mother        Breast   Breast cancer Mother    Other Father        Drowned   Prostate cancer Paternal Grandfather    Heart attack Paternal Grandfather    Pneumonia Brother    Hypertension Son    Hyperlipidemia Son      Social History Ms. Bonus reports  that she has never smoked. She has never been exposed to tobacco smoke. She has never used smokeless tobacco. Ms. Evers reports no history of alcohol use.    Physical Examination Today's Vitals   05/12/24 0915 05/12/24 0921  BP: (!) 140/70 (!) 140/62  Pulse: 79   SpO2: 97%   Weight: 134 lb 9.6 oz (61.1 kg)   Height: 5' 3.5 (1.613 m)    Body mass index is 23.47 kg/m.  Gen: resting comfortably, no acute distress HEENT: no scleral icterus, pupils equal round and reactive, no palptable cervical adenopathy,  CV: RRR, 3/6 systolic murmur apex, no jvd Resp: Clear to auscultation bilaterally GI: abdomen is soft, non-tender, non-distended, normal bowel sounds, no hepatosplenomegaly MSK: extremities are warm, no edema.  Skin: warm, no rash Neuro:  no focal deficits Psych: appropriate affect   Diagnostic Studies  02/2018 echo Study Conclusions   - Left ventricle: The cavity size was normal. Wall thickness was   increased in a pattern of mild LVH. Systolic function was normal.   The estimated ejection fraction was in the range of 55% to 60%.   Wall motion was normal; there were no regional wall motion   abnormalities. Doppler parameters are consistent with abnormal   left ventricular relaxation (grade 1 diastolic dysfunction).   Doppler parameters are consistent with indeterminate ventricular   filling pressure. - Mitral valve: Mild bileaflet prolapse. Mildly thickened leaflets   . There was moderate regurgitation. - Left atrium: The atrium was severely dilated. - Tricuspid valve: There was mild regurgitation.     08/2019 echo IMPRESSIONS     1. Left ventricular ejection fraction, by visual estimation, is 60 to  65%. The left ventricle has normal function. There is mildly increased  left ventricular hypertrophy.   2. Left ventricular diastolic parameters are indeterminate.   3. Global right ventricle has normal systolic function.The right  ventricular size is normal. No  increase in right ventricular wall  thickness.   4. Left atrial size was mildly dilated.   5. Right atrial size was normal.   6. The mitral valve is abnormal. Mild to moderate mitral valve  regurgitation. No evidence of mitral stenosis.   7. 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 The MR jet is eccentric  and posterior, which may lead to underestimation. The MV/AV TVI ratio is 1  suggestiong mild to moderate MR.   8. The tricuspid valve is normal in structure. Tricuspid valve  regurgitation is mild.   9. The aortic valve is tricuspid. Aortic valve regurgitation  is not  visualized. No evidence of aortic valve sclerosis or stenosis.  10. The pulmonic valve was not well visualized. Pulmonic valve  regurgitation is not visualized.  11. Normal pulmonary artery systolic pressure.  12. The inferior vena cava is normal in size with greater than 50%  respiratory variability, suggesting right atrial pressure of 3 mmHg.  13. 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      03/2022 echo IMPRESSIONS     1. Left ventricular ejection fraction, by estimation, is 65 to 70%. The  left ventricle has normal function. The left ventricle has no regional  wall motion abnormalities. Left ventricular diastolic parameters were  normal.   2. Right ventricular systolic function is normal. The right ventricular  size is normal. There is normal pulmonary artery systolic pressure.   3. Left atrial size was moderately dilated.   4. 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 Multiple jets of MR, anterior  and posteriorly directed. Difficult to quantitate, appears at least  moderate in severity. Consider TEE to further define mechanism and  severity of MR. The mitral valve is  myxomatous.   5. The aortic valve is tricuspid. Aortic valve regurgitation is not  visualized.   6. The inferior vena cava is normal in size with greater than 50%  respiratory variability, suggesting right atrial pressure of 3  mmHg.    04/2023 echo 1. Left ventricular ejection fraction, by estimation, is 55 to 60%. The  left ventricle has normal function. The left ventricle has no regional  wall motion abnormalities. The left ventricular internal cavity size was  mildly dilated. Left ventricular  diastolic parameters were normal.   2. Right ventricular systolic function is normal. The right ventricular  size is normal. There is normal pulmonary artery systolic pressure.   3. Left atrial size was mildly dilated.   4. Myxomatous leaflets with bi leaflet prolapse worse for posterior  leaflet Multiple jets predominently anterior Splay artifact See prior  recommendations regarding TEE if clinically indicated . The mitral valve  is myxomatous. Moderate to severe mitral  valve regurgitation. No evidence of mitral stenosis.   5. The aortic valve is tricuspid. There is mild calcification of the  aortic valve. Aortic valve regurgitation is not visualized. Aortic valve  sclerosis is present, with no evidence of aortic valve stenosis.   6. The inferior vena cava is normal in size with greater than 50%  respiratory variability, suggesting right atrial pressure of 3 mmHg.        Assessment and Plan   1. Mitral regurgitation -most recent echo moderate to severe MR. LVEF 60%, LVIDs 3. 9 cm.  - will shorten interval to 6 months for repeat echo given borderline parameters on her EF and LVIDs, remains asymptomatic.    2. HTN -elevated here but at goal by home numbers, continue current meds   3. Hyperlipidemia - at goal, continue current meds     Dorn PHEBE Ross, M.D.

## 2024-06-04 ENCOUNTER — Other Ambulatory Visit

## 2024-06-12 ENCOUNTER — Other Ambulatory Visit: Payer: Self-pay | Admitting: Cardiology

## 2024-06-18 ENCOUNTER — Ambulatory Visit: Attending: Cardiology

## 2024-06-18 DIAGNOSIS — I340 Nonrheumatic mitral (valve) insufficiency: Secondary | ICD-10-CM

## 2024-06-19 LAB — ECHOCARDIOGRAM COMPLETE
AR max vel: 2.03 cm2
AV Peak grad: 10.8 mmHg
Ao pk vel: 1.64 m/s
Area-P 1/2: 3.15 cm2
Calc EF: 59 %
S' Lateral: 3.5 cm
Single Plane A2C EF: 63.5 %
Single Plane A4C EF: 54.4 %

## 2024-06-26 ENCOUNTER — Encounter: Payer: Self-pay | Admitting: Family Medicine

## 2024-06-26 ENCOUNTER — Ambulatory Visit: Admitting: Family Medicine

## 2024-06-26 VITALS — BP 128/77 | HR 76 | Temp 98.0°F | Ht 63.5 in | Wt 135.0 lb

## 2024-06-26 DIAGNOSIS — E559 Vitamin D deficiency, unspecified: Secondary | ICD-10-CM

## 2024-06-26 DIAGNOSIS — I10 Essential (primary) hypertension: Secondary | ICD-10-CM | POA: Diagnosis not present

## 2024-06-26 DIAGNOSIS — Z0000 Encounter for general adult medical examination without abnormal findings: Secondary | ICD-10-CM

## 2024-06-26 DIAGNOSIS — E782 Mixed hyperlipidemia: Secondary | ICD-10-CM | POA: Diagnosis not present

## 2024-06-26 DIAGNOSIS — G500 Trigeminal neuralgia: Secondary | ICD-10-CM

## 2024-06-26 DIAGNOSIS — Z23 Encounter for immunization: Secondary | ICD-10-CM

## 2024-06-26 DIAGNOSIS — I340 Nonrheumatic mitral (valve) insufficiency: Secondary | ICD-10-CM | POA: Diagnosis not present

## 2024-06-26 DIAGNOSIS — Z0001 Encounter for general adult medical examination with abnormal findings: Secondary | ICD-10-CM | POA: Diagnosis not present

## 2024-06-26 DIAGNOSIS — M8589 Other specified disorders of bone density and structure, multiple sites: Secondary | ICD-10-CM | POA: Diagnosis not present

## 2024-06-26 LAB — URINALYSIS
Bilirubin, UA: NEGATIVE
Glucose, UA: NEGATIVE
Ketones, UA: NEGATIVE
Nitrite, UA: NEGATIVE
Protein,UA: NEGATIVE
Specific Gravity, UA: 1.015 (ref 1.005–1.030)
Urobilinogen, Ur: 1 mg/dL (ref 0.2–1.0)
pH, UA: 8.5 — ABNORMAL HIGH (ref 5.0–7.5)

## 2024-06-26 NOTE — Progress Notes (Signed)
 Subjective:  Patient ID: Kathryn Young, female    DOB: November 24, 1945  Age: 78 y.o. MRN: [id number]  CC: Annual Exam   HPI  Discussed the use of AI scribe software for clinical note transcription with the patient, who gave verbal consent to proceed.  History of Present Illness Kathryn Young is a 78 year old female who presents for a routine physical exam.  She has a history of hearing loss and has been using hearing aids since the age of 52. She notes a decrease in hearing in one ear, sometimes requiring family members to repeat themselves or turning the TV volume up too high.  She discusses dental issues, specifically a tooth that requires a root canal, identified on an x-ray. She has not experienced any pain from it.  She reports that her cardiologist identified a valve 'leak' and recommended regular echocardiograms, which she has recently undergone. She sees a cardiologist for her heart condition and undergoes regular echocardiograms to monitor the condition.  She experiences trigeminal neuralgia but does not report any current pain. She takes gabapentin  for this condition.  She has a history of a hiatal hernia and takes pantoprazole  daily to manage symptoms. Stress can exacerbate her symptoms, but the medication generally controls them.  She is active, attending exercise classes twice a week and participating in church activities, including vacuuming the church.  She has a history of skin cancer and has had procedures on her nose to address it. She continues to see a dermatologist for monitoring.  She takes ibuprofen as needed for pain and prefers to minimize medication use.          06/26/2024    8:54 AM 01/17/2024    9:22 AM 06/27/2023    7:47 AM  Depression screen PHQ 2/9  Decreased Interest 0 0 0  Down, Depressed, Hopeless 0 0 0  PHQ - 2 Score 0 0 0  Altered sleeping  1   Tired, decreased energy  0   Change in appetite  0   Feeling bad or failure about yourself   0    Trouble concentrating  0   Moving slowly or fidgety/restless  0   Suicidal thoughts  0   PHQ-9 Score  1   Difficult doing work/chores  Not difficult at all     History Kathryn Young has a past medical history of Brain infection, Breast mass, right, Hyperlipidemia, Hypertension, Meningitis, Neuromuscular disorder (HCC), and SUNCT (short unilateral neuralgiform headache, conjunctival inj/tear) (02/16/2017).   She has a past surgical history that includes Abdominal hysterectomy (1990); Kidney Stones; Breast surgery (Left, 1981); Breast lumpectomy with radioactive seed localization (Right, 03/27/2016); Breast lumpectomy (Right, 2017); Breast lumpectomy (Left); Cataract extraction (Bilateral, 2021); Breast biopsy (Right, 02/02/2016); Breast excisional biopsy (Right, 03/27/2016); Breast excisional biopsy (Left); and Mohs surgery.   Her family history includes Breast cancer in her mother; Cancer in her mother; Heart attack in her paternal grandfather; Hyperlipidemia in her son; Hypertension in her son; Other in her father; Pneumonia in her brother; Prostate cancer in her paternal grandfather.She reports that she has never smoked. She has never been exposed to tobacco smoke. She has never used smokeless tobacco. She reports that she does not drink alcohol and does not use drugs.    ROS Review of Systems  Constitutional:  Negative for appetite change, chills, diaphoresis, fatigue, fever and unexpected weight change.  HENT:  Negative for congestion, ear pain, hearing loss, postnasal drip, rhinorrhea, sneezing, sore throat and trouble swallowing.  Eyes:  Negative for pain.  Respiratory:  Negative for cough, chest tightness and shortness of breath.   Cardiovascular:  Negative for chest pain and palpitations.  Gastrointestinal:  Negative for abdominal pain, constipation, diarrhea, nausea and vomiting.  Endocrine: Negative for cold intolerance, heat intolerance, polydipsia, polyphagia and polyuria.   Genitourinary:  Negative for dysuria, frequency and menstrual problem.  Musculoskeletal:  Negative for arthralgias and joint swelling.  Skin:  Negative for rash.  Allergic/Immunologic: Negative for environmental allergies.  Neurological:  Negative for dizziness, weakness, numbness and headaches.  Psychiatric/Behavioral:  Negative for agitation and dysphoric mood.     Objective:  BP 128/77   Pulse 76   Temp 98 F (36.7 C)   Ht 5' 3.5 (1.613 m)   Wt 135 lb (61.2 kg)   SpO2 98%   BMI 23.54 kg/m   BP Readings from Last 3 Encounters:  06/26/24 128/77  05/12/24 (!) 140/62  01/17/24 119/65    Wt Readings from Last 3 Encounters:  06/26/24 135 lb (61.2 kg)  05/12/24 134 lb 9.6 oz (61.1 kg)  01/17/24 134 lb (60.8 kg)     Physical Exam Constitutional:      General: She is not in acute distress.    Appearance: She is well-developed.  HENT:     Head: Normocephalic and atraumatic.  Eyes:     Conjunctiva/sclera: Conjunctivae normal.     Pupils: Pupils are equal, round, and reactive to light.  Neck:     Thyroid : No thyromegaly.  Cardiovascular:     Rate and Rhythm: Normal rate and regular rhythm.     Heart sounds: Normal heart sounds. No murmur heard. Pulmonary:     Effort: Pulmonary effort is normal. No respiratory distress.     Breath sounds: Normal breath sounds. No wheezing or rales.  Abdominal:     General: Bowel sounds are normal. There is no distension.     Palpations: Abdomen is soft.     Tenderness: There is no abdominal tenderness.  Musculoskeletal:        General: Normal range of motion.     Cervical back: Normal range of motion and neck supple.  Lymphadenopathy:     Cervical: No cervical adenopathy.  Skin:    General: Skin is warm and dry.  Neurological:     Mental Status: She is alert and oriented to person, place, and time.  Psychiatric:        Behavior: Behavior normal.        Thought Content: Thought content normal.        Judgment: Judgment normal.       Assessment & Plan:  Trigeminal neuralgia of right side of face -     CBC with Differential/Platelet -     CMP14+EGFR  Well adult exam -     CBC with Differential/Platelet -     CMP14+EGFR  Vitamin D  deficiency -     CBC with Differential/Platelet -     CMP14+EGFR -     VITAMIN D  25 Hydroxy (Vit-D Deficiency, Fractures)  Mixed hyperlipidemia -     CBC with Differential/Platelet -     CMP14+EGFR -     Lipid panel  Primary hypertension -     CBC with Differential/Platelet -     CMP14+EGFR -     Urinalysis  Mitral valve insufficiency, unspecified etiology -     CBC with Differential/Platelet -     CMP14+EGFR  Osteopenia of multiple sites -     CBC  with Differential/Platelet -     CMP14+EGFR  Encounter for immunization -     Flu vaccine HIGH DOSE PF(Fluzone Trivalent)    Assessment and Plan Assessment & Plan Mitral valve regurgitation   Mitral valve regurgitation presents with a late systolic murmur, graded 2/6, loudest under the left arm, likely due to age-related changes. Continue regular echocardiograms to monitor the condition. Discuss potential medication options with a cardiologist if not already addressed. Consider valve replacement, noting it typically requires open surgery, unlike aortic valve replacement which can be done via catheter.  Hiatal hernia with gastroesophageal reflux disease (GERD)   Hiatal hernia with GERD is managed with pantoprazole , which effectively controls symptoms. An occasional choking sensation may be related to acid reflux. Continue pantoprazole  for GERD management and ensure adherence to the medication regimen to prevent reflux symptoms.  Trigeminal neuralgia   Trigeminal neuralgia is present but not causing significant pain, managed with gabapentin . Continue gabapentin  for management.  Osteoarthritis of hip   Mild osteoarthritis of the hip causes occasional discomfort. Use ibuprofen as needed for hip discomfort and monitor for  any increase in symptoms.  Presbycusis   Suspected age-related hearing loss with decreased hearing in one ear, not using hearing aids. Discuss hearing concerns with family to assess the need for hearing aids.  Dental health   A tooth requires a root canal. Advised against extraction if capping or root canal is possible. Consult with a dentist regarding root canal and capping options and avoid unnecessary tooth extraction.       Follow-up: Return in about 6 months (around 12/24/2024).  Butler Der, M.D.

## 2024-06-27 LAB — CMP14+EGFR
ALT: 17 IU/L (ref 0–32)
AST: 16 IU/L (ref 0–40)
Albumin: 4.2 g/dL (ref 3.8–4.8)
Alkaline Phosphatase: 96 IU/L (ref 44–121)
BUN/Creatinine Ratio: 14 (ref 12–28)
BUN: 13 mg/dL (ref 8–27)
Bilirubin Total: 0.6 mg/dL (ref 0.0–1.2)
CO2: 26 mmol/L (ref 20–29)
Calcium: 9.2 mg/dL (ref 8.7–10.3)
Chloride: 103 mmol/L (ref 96–106)
Creatinine, Ser: 0.93 mg/dL (ref 0.57–1.00)
Globulin, Total: 2.1 g/dL (ref 1.5–4.5)
Glucose: 104 mg/dL — ABNORMAL HIGH (ref 70–99)
Potassium: 4 mmol/L (ref 3.5–5.2)
Sodium: 140 mmol/L (ref 134–144)
Total Protein: 6.3 g/dL (ref 6.0–8.5)
eGFR: 63 mL/min/1.73 (ref >59)

## 2024-06-27 LAB — CBC WITH DIFFERENTIAL/PLATELET
Basophils Absolute: 0 x10E3/uL (ref 0.0–0.2)
Basos: 1 %
EOS (ABSOLUTE): 0.1 x10E3/uL (ref 0.0–0.4)
Eos: 1 %
Hematocrit: 38.2 % (ref 34.0–46.6)
Hemoglobin: 12.7 g/dL (ref 11.1–15.9)
Immature Grans (Abs): 0 x10E3/uL (ref 0.0–0.1)
Immature Granulocytes: 0 %
Lymphocytes Absolute: 2.1 x10E3/uL (ref 0.7–3.1)
Lymphs: 36 %
MCH: 33 pg (ref 26.6–33.0)
MCHC: 33.2 g/dL (ref 31.5–35.7)
MCV: 99 fL — ABNORMAL HIGH (ref 79–97)
Monocytes Absolute: 0.4 x10E3/uL (ref 0.1–0.9)
Monocytes: 7 %
Neutrophils Absolute: 3.2 x10E3/uL (ref 1.4–7.0)
Neutrophils: 55 %
Platelets: 211 x10E3/uL (ref 150–450)
RBC: 3.85 x10E6/uL (ref 3.77–5.28)
RDW: 12.2 % (ref 11.7–15.4)
WBC: 5.9 x10E3/uL (ref 3.4–10.8)

## 2024-06-27 LAB — LIPID PANEL
Chol/HDL Ratio: 2.4 ratio (ref 0.0–4.4)
Cholesterol, Total: 130 mg/dL (ref 100–199)
HDL: 55 mg/dL (ref >39)
LDL Chol Calc (NIH): 58 mg/dL (ref 0–99)
Triglycerides: 88 mg/dL (ref 0–149)
VLDL Cholesterol Cal: 17 mg/dL (ref 5–40)

## 2024-06-27 LAB — VITAMIN D 25 HYDROXY (VIT D DEFICIENCY, FRACTURES): Vit D, 25-Hydroxy: 72.2 ng/mL (ref 30.0–100.0)

## 2024-06-30 ENCOUNTER — Ambulatory Visit: Payer: Self-pay | Admitting: Family Medicine

## 2024-06-30 NOTE — Progress Notes (Signed)
Hello Emyia,  Your lab result is normal and/or stable.Some minor variations that are not significant are commonly marked abnormal, but do not represent any medical problem for you.  Best regards, Mechele Claude, M.D.

## 2024-07-22 ENCOUNTER — Encounter: Payer: Self-pay | Admitting: *Deleted

## 2024-07-22 ENCOUNTER — Ambulatory Visit: Payer: Self-pay | Admitting: Cardiology

## 2024-08-04 ENCOUNTER — Encounter: Payer: Self-pay | Admitting: Family Medicine

## 2024-08-04 ENCOUNTER — Ambulatory Visit: Payer: Self-pay

## 2024-08-04 ENCOUNTER — Ambulatory Visit (INDEPENDENT_AMBULATORY_CARE_PROVIDER_SITE_OTHER): Admitting: Family Medicine

## 2024-08-04 VITALS — BP 128/72 | HR 86 | Temp 98.5°F | Ht 63.5 in | Wt 134.2 lb

## 2024-08-04 DIAGNOSIS — J329 Chronic sinusitis, unspecified: Secondary | ICD-10-CM | POA: Diagnosis not present

## 2024-08-04 DIAGNOSIS — M778 Other enthesopathies, not elsewhere classified: Secondary | ICD-10-CM

## 2024-08-04 DIAGNOSIS — G500 Trigeminal neuralgia: Secondary | ICD-10-CM

## 2024-08-04 DIAGNOSIS — J40 Bronchitis, not specified as acute or chronic: Secondary | ICD-10-CM

## 2024-08-04 MED ORDER — BENZONATATE 200 MG PO CAPS: 200.0000 mg | ORAL_CAPSULE | Freq: Three times a day (TID) | ORAL | 0 refills | Status: AC | PRN

## 2024-08-04 MED ORDER — AMOXICILLIN-POT CLAVULANATE 875-125 MG PO TABS: 1.0000 | ORAL_TABLET | Freq: Two times a day (BID) | ORAL | 0 refills | Status: AC

## 2024-08-04 NOTE — Telephone Encounter (Signed)
 Reason for Disposition . SEVERE coughing spells (e.g., whooping sound after coughing, vomiting after coughing)  Protocols used: Cough - Acute Productive-A-AH

## 2024-08-04 NOTE — Telephone Encounter (Addendum)
 FYI Only or Action Required?: FYI only for provider.  Patient was last seen in primary care on 06/26/2024 by Zollie Lowers, MD.  Called Nurse Triage reporting Cough.  Symptoms began several days ago.  Interventions attempted: OTC medications: Delsym, coricidin.  Symptoms are: productive cough with green mucus, abdominal and chest pain after coughing spells, chills, left elbow localized pain and swelling stable.  Triage Disposition: See Physician Within 24 Hours  Patient/caregiver understands and will follow disposition?: Yes           Copied from CRM #[id number]. Topic: Clinical - Red Word Triage >> Aug 04, 2024  8:05 AM Mia F wrote: Red Word that prompted transfer to Nurse Triage: Coughing thats causing chest and stomach pain. Dark green mucous, hard to swallow, and body aches. No fever. Has been taking OTC medications for the past week with no relief. Has been going on for a week and is getting worse. Answer Assessment - Initial Assessment Questions Taking Coricidin and Delsym and has worsened over the weekend.  1. ONSET: When did the cough begin?      Wednesday or Thursday.  2. SEVERITY: How bad is the cough today?      She states when she got up this morning, the cough was bad. When eating or talking on the phone causes coughing spells. Last took cough syrup around 0500 which calms it down some. Chest and abdomen sore after coughing.  3. SPUTUM: Describe the color of your sputum (e.g., none, dry cough; clear, white, yellow, green)     Dark green.  4. HEMOPTYSIS: Are you coughing up any blood? If Yes, ask: How much? (e.g., flecks, streaks, tablespoons, etc.)     No.  5. DIFFICULTY BREATHING: Are you having difficulty breathing? If Yes, ask: How bad is it? (e.g., mild, moderate, severe)      No.  6. FEVER: Do you have a fever? If Yes, ask: What is your temperature, how was it measured, and when did it start?     No.  7. CARDIAC HISTORY: Do you have  any history of heart disease? (e.g., heart attack, congestive heart failure)      Mitral regurgitation Primary hypertension Heart murmur  8. LUNG HISTORY: Do you have any history of lung disease?  (e.g., pulmonary embolus, asthma, emphysema)     No.  9. PE RISK FACTORS: Do you have a history of blood clots? (or: recent major surgery, recent prolonged travel, bedridden)     No.  10. OTHER SYMPTOMS: Do you have any other symptoms? (e.g., runny nose, wheezing, chest pain)       Right eye watering, painful swallowing (able to swallow saliva, water, pills, and eat), chills, left arm elbow pain and localized knot x 1 month. Denies facial numbness, SOB/wheezing, runny nose, sore throat.  11. PREGNANCY: Is there any chance you are pregnant? When was your last menstrual period?       N/A.  12. TRAVEL: Have you traveled out of the country in the last month? (e.g., travel history, exposures)       No known exposures.  Protocols used: Cough - Acute Productive-A-AH

## 2024-08-04 NOTE — Progress Notes (Signed)
 "  Subjective:  Patient ID: Kathryn Young, female    DOB: [birth year]  Age: 78 y.o. MRN: [id number]  CC: Cough (Productive, green phlegm/Post nasal/Achy chest from coughing/Bilateral ear pain)   HPI  Discussed the use of AI scribe software for clinical note transcription with the patient, who gave verbal consent to proceed.  History of Present Illness Kathryn Young is a 78 year old female who presents with chills, cough, and ear pain.  Her symptoms began on Thursday morning after attending an event at the church on Wednesday night. She experienced chills without fever, which progressively worsened, and developed a productive cough with yellow to green sputum. She has been taking Coricidin, and Tylenol , which have provided some relief. However, she continues to experience severe coughing spells lasting about ten minutes. No shortness of breath is noted.  She reports no drainage from her sinuses but feels stuffy, particularly on the right side, with soreness when swallowing. She also experiences pain in her right ear, especially when swallowing, and notes soreness in her stomach and chest, likely due to coughing. Concern for history of trigeminal neuralgia that this might cause a flare.  She has not been around anyone known to be sick, except for her granddaughter who had a runny nose. She avoided contact with her granddaughter over the weekend due to her own illness.  She has not taken Mucinex this time due to a previous experience of elevated blood pressure after its use. She has no known allergies to medications, including penicillin.  She also mentions pain in her left elbow, which hurts when touched or bumped. She has been using Tylenol  and applying heat for relief.          08/04/2024    9:18 AM 06/26/2024    8:54 AM 01/17/2024    9:22 AM  Depression screen PHQ 2/9  Decreased Interest 0 0 0  Down, Depressed, Hopeless 0 0 0  PHQ - 2 Score 0 0 0  Altered sleeping   1  Tired,  decreased energy   0  Change in appetite   0  Feeling bad or failure about yourself    0  Trouble concentrating   0  Moving slowly or fidgety/restless   0  Suicidal thoughts   0  PHQ-9 Score   1  Difficult doing work/chores   Not difficult at all    History Kathryn Young has a past medical history of Brain infection, Breast mass, right, Hyperlipidemia, Hypertension, Meningitis, Neuromuscular disorder (HCC), and SUNCT (short unilateral neuralgiform headache, conjunctival inj/tear) (02/16/2017).   She has a past surgical history that includes Abdominal hysterectomy (1990); Kidney Stones; Breast surgery (Left, 1981); Breast lumpectomy with radioactive seed localization (Right, 03/27/2016); Breast lumpectomy (Right, 2017); Breast lumpectomy (Left); Cataract extraction (Bilateral, 2021); Breast biopsy (Right, 02/02/2016); Breast excisional biopsy (Right, 03/27/2016); Breast excisional biopsy (Left); and Mohs surgery.   Her family history includes Breast cancer in her mother; Cancer in her mother; Heart attack in her paternal grandfather; Hyperlipidemia in her son; Hypertension in her son; Other in her father; Pneumonia in her brother; Prostate cancer in her paternal grandfather.She reports that she has never smoked. She has never been exposed to tobacco smoke. She has never used smokeless tobacco. She reports that she does not drink alcohol and does not use drugs.    ROS Review of Systems  Constitutional:  Negative for activity change, appetite change, chills and fever.  HENT:  Positive for congestion, postnasal drip, rhinorrhea and sinus pressure.  Negative for ear discharge, ear pain, hearing loss, nosebleeds, sneezing and trouble swallowing.   Respiratory:  Negative for chest tightness and shortness of breath.   Cardiovascular:  Negative for chest pain and palpitations.  Musculoskeletal:  Positive for arthralgias (elbow).  Skin:  Negative for rash.    Objective:  BP 128/72   Pulse 86   Temp 98.5 F  (36.9 C)   Ht 5' 3.5 (1.613 m)   Wt 134 lb 3.2 oz (60.9 kg)   SpO2 96%   BMI 23.40 kg/m   BP Readings from Last 3 Encounters:  08/04/24 128/72  06/26/24 128/77  05/12/24 (!) 140/62    Wt Readings from Last 3 Encounters:  08/04/24 134 lb 3.2 oz (60.9 kg)  06/26/24 135 lb (61.2 kg)  05/12/24 134 lb 9.6 oz (61.1 kg)     Physical Exam Musculoskeletal:        General: Tenderness: left elbow, extensor origin.    Physical Exam GENERAL: Alert, cooperative, well developed, no acute distress. HEENT: Normocephalic, normal oropharynx, moist mucous membranes. Right ear normal. Nasal congestion on the right side with red and swollen mucosa and mucous discharge. Right maxillary sinus swollen, likely infected. CHEST: Clear to auscultation bilaterally. No wheezes, rhonchi, or crackles. CARDIOVASCULAR: Normal heart rate and rhythm. S1 and S2 normal without murmurs. ABDOMEN: Soft, non-tender, non-distended, without organomegaly. Normal bowel sounds. EXTREMITIES: No cyanosis or edema. NEUROLOGICAL: Cranial nerves grossly intact. Moves all extremities without gross motor or sensory deficit.   Assessment & Plan:  Sinobronchitis  Tendinitis of left elbow  Other orders -     Amoxicillin -Pot Clavulanate; Take 1 tablet by mouth 2 (two) times daily. Take all of this medication  Dispense: 20 tablet; Refill: 0 -     Benzonatate ; Take 1 capsule (200 mg total) by mouth 3 (three) times daily as needed for cough.  Dispense: 20 capsule; Refill: 0    Assessment and Plan Assessment & Plan Acute right maxillary sinusitis   She presents with acute right maxillary sinusitis, experiencing congestion and pain on swallowing. Examination shows swelling and redness in the right nasal passage with mucous discharge, confirming a sinus infection. No fever is reported. Right ear pain is likely due to eustachian tube congestion. Prescribe Augmentin  for the sinus infection. Recommend an over-the-counter  decongestant, cautioning about potential side effects like increased heart rate and insomnia. Advise using Mucinex D, to be obtained from the pharmacist.  Acute bronchitis with productive cough   She has acute bronchitis with a productive cough, initially presenting with chills and progressing to yellow and green sputum. There is no shortness of breath, but she experiences severe coughing spells lasting about ten minutes. No recent exposure to sick individuals, and COVID-19 testing is unnecessary. Prescribe cough suppressant pills (cough pearls). Advise against Mucinex due to previous blood pressure elevation. Recommend Tylenol  for symptomatic relief.  Left elbow tendinitis   She has left elbow tendinitis with pain upon touch and exacerbation upon impact. Recommend Tylenol  for pain management. Suggest applying heat to the affected area frequently through the day. Add PT if not resolved over 4 weeks time.       Follow-up: No follow-ups on file.  Butler Der, M.D. "

## 2024-08-04 NOTE — Telephone Encounter (Signed)
 Appt made.

## 2024-08-07 ENCOUNTER — Encounter: Payer: Self-pay | Admitting: Family Medicine

## 2024-08-14 ENCOUNTER — Other Ambulatory Visit: Payer: Self-pay | Admitting: Family Medicine

## 2024-08-14 DIAGNOSIS — E559 Vitamin D deficiency, unspecified: Secondary | ICD-10-CM

## 2024-09-07 ENCOUNTER — Encounter: Payer: Self-pay | Admitting: Family Medicine

## 2024-10-06 ENCOUNTER — Other Ambulatory Visit: Payer: Self-pay | Admitting: Family Medicine

## 2024-10-06 DIAGNOSIS — M8589 Other specified disorders of bone density and structure, multiple sites: Secondary | ICD-10-CM

## 2024-12-25 ENCOUNTER — Ambulatory Visit: Payer: Self-pay | Admitting: Family Medicine

## 2025-01-23 ENCOUNTER — Ambulatory Visit: Admitting: Cardiology
